# Patient Record
Sex: Female | Born: 1945 | Race: Black or African American | Hispanic: No | Smoking: Never smoker
Health system: Southern US, Community
[De-identification: ages and names within clinical notes are randomized; demographics above are authoritative.]

## PROBLEM LIST (undated history)

## (undated) DIAGNOSIS — E119 Type 2 diabetes mellitus without complications: Secondary | ICD-10-CM

## (undated) DIAGNOSIS — K219 Gastro-esophageal reflux disease without esophagitis: Secondary | ICD-10-CM

## (undated) DIAGNOSIS — I10 Essential (primary) hypertension: Secondary | ICD-10-CM

## (undated) DIAGNOSIS — C801 Malignant (primary) neoplasm, unspecified: Secondary | ICD-10-CM

## (undated) DIAGNOSIS — E785 Hyperlipidemia, unspecified: Secondary | ICD-10-CM

## (undated) HISTORY — PX: COLON SURGERY: SHX602

## (undated) HISTORY — PX: CHOLECYSTECTOMY: SHX55

## (undated) HISTORY — PX: OTHER SURGICAL HISTORY: SHX169

---

## 2014-03-03 ENCOUNTER — Ambulatory Visit: Payer: Self-pay | Admitting: Physician Assistant

## 2014-03-04 DIAGNOSIS — I10 Essential (primary) hypertension: Secondary | ICD-10-CM

## 2014-03-04 DIAGNOSIS — I872 Venous insufficiency (chronic) (peripheral): Secondary | ICD-10-CM | POA: Diagnosis present

## 2014-03-04 DIAGNOSIS — Z85038 Personal history of other malignant neoplasm of large intestine: Secondary | ICD-10-CM | POA: Diagnosis present

## 2014-03-04 DIAGNOSIS — I8311 Varicose veins of right lower extremity with inflammation: Secondary | ICD-10-CM | POA: Insufficient documentation

## 2014-04-13 DIAGNOSIS — G629 Polyneuropathy, unspecified: Secondary | ICD-10-CM

## 2014-04-13 DIAGNOSIS — M8580 Other specified disorders of bone density and structure, unspecified site: Secondary | ICD-10-CM | POA: Insufficient documentation

## 2014-04-13 DIAGNOSIS — E559 Vitamin D deficiency, unspecified: Secondary | ICD-10-CM | POA: Insufficient documentation

## 2014-04-13 DIAGNOSIS — G4730 Sleep apnea, unspecified: Secondary | ICD-10-CM | POA: Diagnosis present

## 2014-04-13 DIAGNOSIS — D509 Iron deficiency anemia, unspecified: Secondary | ICD-10-CM | POA: Insufficient documentation

## 2015-02-21 ENCOUNTER — Other Ambulatory Visit: Payer: Self-pay | Admitting: Geriatric Medicine

## 2015-02-21 DIAGNOSIS — Z1231 Encounter for screening mammogram for malignant neoplasm of breast: Secondary | ICD-10-CM

## 2015-02-21 DIAGNOSIS — Z0000 Encounter for general adult medical examination without abnormal findings: Secondary | ICD-10-CM

## 2015-03-16 ENCOUNTER — Ambulatory Visit: Payer: Self-pay | Attending: Geriatric Medicine

## 2015-05-11 ENCOUNTER — Encounter: Payer: Self-pay | Admitting: *Deleted

## 2015-05-12 ENCOUNTER — Ambulatory Visit
Admission: RE | Admit: 2015-05-12 | Discharge: 2015-05-12 | Disposition: A | Discharge status: Home / Self Care | Payer: Medicare (Managed Care) | Source: Ambulatory Visit | Attending: Gastroenterology | Admitting: Gastroenterology

## 2015-05-12 ENCOUNTER — Encounter
Admission: RE | Disposition: A | Discharge status: Home / Self Care | Payer: Self-pay | Source: Ambulatory Visit | Attending: Gastroenterology

## 2015-05-12 ENCOUNTER — Ambulatory Visit: Payer: Medicare (Managed Care) | Admitting: Anesthesiology

## 2015-05-12 ENCOUNTER — Encounter: Payer: Self-pay | Admitting: *Deleted

## 2015-05-12 DIAGNOSIS — R1310 Dysphagia, unspecified: Secondary | ICD-10-CM | POA: Diagnosis present

## 2015-05-12 DIAGNOSIS — E119 Type 2 diabetes mellitus without complications: Secondary | ICD-10-CM | POA: Insufficient documentation

## 2015-05-12 DIAGNOSIS — Z6841 Body Mass Index (BMI) 40.0 and over, adult: Secondary | ICD-10-CM | POA: Diagnosis not present

## 2015-05-12 DIAGNOSIS — Z85038 Personal history of other malignant neoplasm of large intestine: Secondary | ICD-10-CM | POA: Insufficient documentation

## 2015-05-12 DIAGNOSIS — E669 Obesity, unspecified: Secondary | ICD-10-CM | POA: Insufficient documentation

## 2015-05-12 DIAGNOSIS — E039 Hypothyroidism, unspecified: Secondary | ICD-10-CM | POA: Insufficient documentation

## 2015-05-12 DIAGNOSIS — Z7984 Long term (current) use of oral hypoglycemic drugs: Secondary | ICD-10-CM | POA: Insufficient documentation

## 2015-05-12 DIAGNOSIS — Z7951 Long term (current) use of inhaled steroids: Secondary | ICD-10-CM | POA: Insufficient documentation

## 2015-05-12 DIAGNOSIS — K210 Gastro-esophageal reflux disease with esophagitis: Secondary | ICD-10-CM | POA: Diagnosis not present

## 2015-05-12 DIAGNOSIS — Z7982 Long term (current) use of aspirin: Secondary | ICD-10-CM | POA: Diagnosis not present

## 2015-05-12 DIAGNOSIS — Z5309 Procedure and treatment not carried out because of other contraindication: Secondary | ICD-10-CM | POA: Insufficient documentation

## 2015-05-12 DIAGNOSIS — I10 Essential (primary) hypertension: Secondary | ICD-10-CM | POA: Diagnosis not present

## 2015-05-12 DIAGNOSIS — Z79899 Other long term (current) drug therapy: Secondary | ICD-10-CM | POA: Insufficient documentation

## 2015-05-12 HISTORY — DX: Type 2 diabetes mellitus without complications: E11.9

## 2015-05-12 HISTORY — PX: ESOPHAGOGASTRODUODENOSCOPY: SHX5428

## 2015-05-12 HISTORY — PX: COLONOSCOPY WITH PROPOFOL: SHX5780

## 2015-05-12 HISTORY — DX: Essential (primary) hypertension: I10

## 2015-05-12 HISTORY — DX: Malignant (primary) neoplasm, unspecified: C80.1

## 2015-05-12 LAB — GLUCOSE, CAPILLARY: Glucose-Capillary: 77 mg/dL (ref 65–99)

## 2015-05-12 SURGERY — COLONOSCOPY WITH PROPOFOL
Anesthesia: General

## 2015-05-12 MED ORDER — PROPOFOL 500 MG/50ML IV EMUL
INTRAVENOUS | Status: DC | PRN
Start: 2015-05-12 — End: 2015-05-12
  Administered 2015-05-12: 100 ug/kg/min via INTRAVENOUS

## 2015-05-12 MED ORDER — PROPOFOL 10 MG/ML IV BOLUS
INTRAVENOUS | Status: DC | PRN
  Administered 2015-05-12: 70 mg via INTRAVENOUS

## 2015-05-12 MED ORDER — SODIUM CHLORIDE 0.9 % IV SOLN
INTRAVENOUS | Status: DC
Start: 2015-05-12 — End: 2015-05-12
  Administered 2015-05-12: 12:00:00 via INTRAVENOUS

## 2015-05-12 MED ORDER — SODIUM CHLORIDE 0.9 % IV SOLN: INTRAVENOUS | Status: DC

## 2015-05-12 NOTE — H&P (Signed)
    Primary Care Physician:  Pcp Not In System Primary Gastroenterologist:  Dr. Candace Cruise  Pre-Procedure History & Physical: HPI:  Kaitlin Wagner is a 69 y.o. female is here for an EGD/colonoscopy.  Past Medical History  Diagnosis Date  . Cancer ([place]) colon  . Diabetes mellitus without complication ([place])   . Hypertension     Past Surgical History  Procedure Laterality Date  . Colon surgery    . Cholecystectomy      Prior to Admission medications   Medication Sig Start Date End Date Taking? Authorizing Provider  ammonium lactate (AMLACTIN) 12 % cream Apply topically 2 (two) times daily.   Yes Historical Provider, MD  aspirin EC 81 MG tablet Take 81 mg by mouth daily.   Yes Historical Provider, MD  atorvastatin (LIPITOR) 40 MG tablet Take 40 mg by mouth daily.   Yes Historical Provider, MD  cholecalciferol (VITAMIN D) 400 UNITS TABS tablet Take 1,000 Units by mouth.   Yes Historical Provider, MD  fluticasone (FLONASE) 50 MCG/ACT nasal spray Place 1 spray into both nostrils daily.   Yes Historical Provider, MD  lisinopril-hydrochlorothiazide (PRINZIDE,ZESTORETIC) 20-12.5 MG tablet Take 2 tablets by mouth daily.   Yes Historical Provider, MD  metoprolol succinate (TOPROL-XL) 25 MG 24 hr tablet Take 25 mg by mouth daily.   Yes Historical Provider, MD  pioglitazone (ACTOS) 15 MG tablet Take 15 mg by mouth daily.   Yes Historical Provider, MD    Allergies as of 04/14/2015  . (Not on File)    History reviewed. No pertinent family history.  Social History   Social History  . Marital Status: Divorced    Spouse Name: N/A  . Number of Children: N/A  . Years of Education: N/A   Occupational History  . Not on file.   Social History Main Topics  . Smoking status: Never Smoker   . Smokeless tobacco: Not on file  . Alcohol Use: No  . Drug Use: No  . Sexual Activity: Not on file   Other Topics Concern  . Not on file   Social History Narrative  . No narrative on file    Review  of Systems: See HPI, otherwise negative ROS  Physical Exam: There were no vitals taken for this visit. General:   Alert,  pleasant and cooperative in NAD Head:  Normocephalic and atraumatic. Neck:  Supple; no masses or thyromegaly. Lungs:  Clear throughout to auscultation.    Heart:  Regular rate and rhythm. Abdomen:  Soft, nontender and nondistended. Normal bowel sounds, without guarding, and without rebound.   Neurologic:  Alert and  oriented x4;  grossly normal neurologically.  Impression/Plan: Kaitlin Wagner is here for an colonoscopy/EGD to be performed for hx of colon cancer and dysphagia.  Risks, benefits, limitations, and alternatives regarding EGD/colonoscopy have been reviewed with the patient.  Questions have been answered.  All parties agreeable.   Urijah Arko, Lupita Dawn, MD  05/12/2015, 10:54 AM

## 2015-05-12 NOTE — Op Note (Signed)
[facility] Gastroenterology Patient Name: Kaitlin Wagner Procedure Date: 05/12/2015 11:34 AM MRN: [id number] Account #: [id number] Date of Birth: January 08, 1946 Admit Type: Outpatient Age: 69 Room: [facility] ENDO ROOM 4 Gender: Female Note Status: Finalized Procedure:         Upper GI endoscopy Indications:       Dysphagia Providers:         Lupita Dawn. Candace Cruise, MD Referring MD:      Forest Gleason Md, MD (Referring MD) Medicines:         Monitored Anesthesia Care Complications:     No immediate complications. Procedure:         Pre-Anesthesia Assessment:                    - Prior to the procedure, a History and Physical was                     performed, and patient medications, allergies and                     sensitivities were reviewed. The patient's tolerance of                     previous anesthesia was reviewed.                    - The risks and benefits of the procedure and the sedation                     options and risks were discussed with the patient. All                     questions were answered and informed consent was obtained.                    - After reviewing the risks and benefits, the patient was                     deemed in satisfactory condition to undergo the procedure.                    After obtaining informed consent, the endoscope was passed                     under direct vision. Throughout the procedure, the                     patient's blood pressure, pulse, and oxygen saturations                     were monitored continuously. The Endoscope was introduced                     through the mouth, and advanced to the second part of                     duodenum. The upper GI endoscopy was accomplished without                     difficulty. The patient tolerated the procedure well. Findings:      There were esophageal mucosal changes suspicious for short-segment       Barrett's esophagus present at the gastroesophageal junction. The       maximum  longitudinal extent of these mucosal changes  was 2 cm in length.       Mucosa was biopsied with a cold forceps for histology in a targeted       manner at the gastroesophageal junction. One specimen bottle was sent to       pathology. The scope was withdrawn. Dilation was performed with a       Maloney dilator with mild resistance at 56 Fr.      The exam was otherwise without abnormality.      The entire examined stomach was normal.      The examined duodenum was normal. Impression:        - Esophageal mucosal changes suspicious for short-segment                     Barrett's esophagus. Biopsied. Dilated.                    - The examination was otherwise normal.                    - Normal stomach.                    - Normal examined duodenum. Recommendation:    - Discharge patient to home.                    - Observe patient's clinical course.                    - Await pathology results.                    - Continue present medications.                    - The findings and recommendations were discussed with the                     patient. Procedure Code(s): --- Professional ---                    [phone], Esophagogastroduodenoscopy, flexible, transoral;                     with biopsy, single or multiple                    43450, Dilation of esophagus, by unguided sound or bougie,                     single or multiple passes Diagnosis Code(s): --- Professional ---                    K22.8, Other specified diseases of esophagus                    R13.10, Dysphagia, unspecified CPT copyright 2014 American Medical Association. All rights reserved. The codes documented in this report are preliminary and upon coder review may  be revised to meet current compliance requirements. Hulen Luster, MD 05/12/2015 12:10:24 PM This report has been signed electronically. Number of Addenda: 0 Note Initiated On: 05/12/2015 11:34 AM      [facility]

## 2015-05-12 NOTE — Transfer of Care (Signed)
Immediate Anesthesia Transfer of Care Note  Patient: Kaitlin Wagner  Procedure(s) Performed: Procedure(s): COLONOSCOPY WITH PROPOFOL (N/A) ESOPHAGOGASTRODUODENOSCOPY (EGD) (N/A)  Patient Location: PACU  Anesthesia Type:General  Level of Consciousness: awake and alert   Airway & Oxygen Therapy: Patient Spontanous Breathing and Patient connected to nasal cannula oxygen  Post-op Assessment: Report given to RN and Post -op Vital signs reviewed and stable  Post vital signs: Reviewed and stable  Last Vitals:  Filed Vitals:   05/12/15 1232  BP: 134/64  Pulse: 85  Temp:   Resp: 24    Complications: No apparent anesthesia complications

## 2015-05-12 NOTE — Anesthesia Postprocedure Evaluation (Signed)
  Anesthesia Post-op Note  Patient: Kaitlin Wagner  Procedure(s) Performed: Procedure(s): COLONOSCOPY WITH PROPOFOL (N/A) ESOPHAGOGASTRODUODENOSCOPY (EGD) (N/A)  Anesthesia type:General  Patient location: PACU  Post pain: Pain level controlled  Post assessment: Post-op Vital signs reviewed, Patient's Cardiovascular Status Stable, Respiratory Function Stable, Patent Airway and No signs of Nausea or vomiting  Post vital signs: Reviewed and stable  Last Vitals:  Filed Vitals:   05/12/15 1252  BP: 138/78  Pulse: 78  Temp:   Resp: 21    Level of consciousness: awake, alert  and patient cooperative  Complications: No apparent anesthesia complications

## 2015-05-12 NOTE — Op Note (Signed)
Elected not to proceed with colonoscopy when large thick liquid stool came out before I started. Rectal exam confirmed poor prep. Will need to reschedule colonoscopy with much better prep next time. EGD was done with bx and dilation.

## 2015-05-12 NOTE — Anesthesia Preprocedure Evaluation (Addendum)
Anesthesia Evaluation  Patient identified by MRN, date of birth, ID band  Reviewed: Allergy & Precautions, NPO status , Patient's Chart, lab work & pertinent test results  History of Anesthesia Complications Negative for: history of anesthetic complications  Airway Mallampati: III       Dental  (+) Partial Upper, Partial Lower   Pulmonary neg pulmonary ROS,    Pulmonary exam normal  + decreased breath sounds      Cardiovascular hypertension, Pt. on medications and Pt. on home beta blockers  Rhythm:Regular Rate:Normal     Neuro/Psych    GI/Hepatic negative GI ROS,   Endo/Other  diabetes, Type 2, Oral Hypoglycemic AgentsHypothyroidism   Renal/GU negative Renal ROS     Musculoskeletal negative musculoskeletal ROS (+)   Abdominal (+) + obese,   Peds  Hematology negative hematology ROS (+)   Anesthesia Other Findings   Reproductive/Obstetrics                            Anesthesia Physical Anesthesia Plan  ASA: III  Anesthesia Plan: General   Post-op Pain Management:    Induction: Intravenous  Airway Management Planned: Nasal Cannula  Additional Equipment:   Intra-op Plan:   Post-operative Plan:   Informed Consent: I have reviewed the patients History and Physical, chart, labs and discussed the procedure including the risks, benefits and alternatives for the proposed anesthesia with the patient or authorized representative who has indicated his/her understanding and acceptance.     Plan Discussed with: CRNA  Anesthesia Plan Comments:        Anesthesia Quick Evaluation

## 2015-05-15 LAB — SURGICAL PATHOLOGY

## 2015-05-17 ENCOUNTER — Encounter: Payer: Self-pay | Admitting: Gastroenterology

## 2015-07-04 ENCOUNTER — Ambulatory Visit
Admission: RE | Admit: 2015-07-04 | Discharge: 2015-07-04 | Disposition: A | Discharge status: Home / Self Care | Payer: Medicare (Managed Care) | Source: Ambulatory Visit | Attending: Geriatric Medicine | Admitting: Geriatric Medicine

## 2015-07-04 DIAGNOSIS — Z1231 Encounter for screening mammogram for malignant neoplasm of breast: Secondary | ICD-10-CM | POA: Diagnosis not present

## 2015-07-04 DIAGNOSIS — Z13820 Encounter for screening for osteoporosis: Secondary | ICD-10-CM | POA: Diagnosis present

## 2015-07-04 DIAGNOSIS — Z780 Asymptomatic menopausal state: Secondary | ICD-10-CM | POA: Insufficient documentation

## 2015-07-04 DIAGNOSIS — Z0000 Encounter for general adult medical examination without abnormal findings: Secondary | ICD-10-CM

## 2018-05-12 ENCOUNTER — Encounter: Payer: Self-pay | Admitting: Anesthesiology

## 2018-05-12 ENCOUNTER — Ambulatory Visit: Payer: Medicare (Managed Care) | Admitting: Anesthesiology

## 2018-05-12 ENCOUNTER — Encounter
Admission: RE | Disposition: A | Discharge status: Home / Self Care | Payer: Self-pay | Source: Ambulatory Visit | Attending: Internal Medicine

## 2018-05-12 ENCOUNTER — Other Ambulatory Visit: Payer: Self-pay

## 2018-05-12 ENCOUNTER — Ambulatory Visit
Admission: RE | Admit: 2018-05-12 | Discharge: 2018-05-12 | Disposition: A | Discharge status: Home / Self Care | Payer: Medicare (Managed Care) | Source: Ambulatory Visit | Attending: Internal Medicine | Admitting: Internal Medicine

## 2018-05-12 DIAGNOSIS — Z1211 Encounter for screening for malignant neoplasm of colon: Secondary | ICD-10-CM | POA: Diagnosis not present

## 2018-05-12 DIAGNOSIS — E119 Type 2 diabetes mellitus without complications: Secondary | ICD-10-CM | POA: Insufficient documentation

## 2018-05-12 DIAGNOSIS — I10 Essential (primary) hypertension: Secondary | ICD-10-CM | POA: Diagnosis not present

## 2018-05-12 DIAGNOSIS — E785 Hyperlipidemia, unspecified: Secondary | ICD-10-CM | POA: Insufficient documentation

## 2018-05-12 DIAGNOSIS — Z9221 Personal history of antineoplastic chemotherapy: Secondary | ICD-10-CM | POA: Insufficient documentation

## 2018-05-12 DIAGNOSIS — Z980 Intestinal bypass and anastomosis status: Secondary | ICD-10-CM | POA: Diagnosis not present

## 2018-05-12 DIAGNOSIS — Z7982 Long term (current) use of aspirin: Secondary | ICD-10-CM | POA: Insufficient documentation

## 2018-05-12 DIAGNOSIS — K640 First degree hemorrhoids: Secondary | ICD-10-CM | POA: Insufficient documentation

## 2018-05-12 DIAGNOSIS — Z7951 Long term (current) use of inhaled steroids: Secondary | ICD-10-CM | POA: Insufficient documentation

## 2018-05-12 DIAGNOSIS — Z79899 Other long term (current) drug therapy: Secondary | ICD-10-CM | POA: Insufficient documentation

## 2018-05-12 DIAGNOSIS — Z800 Family history of malignant neoplasm of digestive organs: Secondary | ICD-10-CM | POA: Insufficient documentation

## 2018-05-12 DIAGNOSIS — Z85038 Personal history of other malignant neoplasm of large intestine: Secondary | ICD-10-CM | POA: Diagnosis not present

## 2018-05-12 DIAGNOSIS — Z7984 Long term (current) use of oral hypoglycemic drugs: Secondary | ICD-10-CM | POA: Diagnosis not present

## 2018-05-12 HISTORY — DX: Hyperlipidemia, unspecified: E78.5

## 2018-05-12 HISTORY — DX: Gastro-esophageal reflux disease without esophagitis: K21.9

## 2018-05-12 HISTORY — PX: COLONOSCOPY WITH PROPOFOL: SHX5780

## 2018-05-12 LAB — GLUCOSE, CAPILLARY: Glucose-Capillary: 65 mg/dL — ABNORMAL LOW (ref 70–99)

## 2018-05-12 SURGERY — COLONOSCOPY WITH PROPOFOL
Anesthesia: General

## 2018-05-12 MED ORDER — FENTANYL CITRATE (PF) 100 MCG/2ML IJ SOLN: 25.0000 ug | INTRAMUSCULAR | Status: DC | PRN

## 2018-05-12 MED ORDER — PROPOFOL 500 MG/50ML IV EMUL
INTRAVENOUS | Status: AC
  Filled 2018-05-12: qty 50

## 2018-05-12 MED ORDER — LIDOCAINE HCL (PF) 1 % IJ SOLN
INTRAMUSCULAR | Status: AC
  Filled 2018-05-12: qty 2

## 2018-05-12 MED ORDER — ONDANSETRON HCL 4 MG/2ML IJ SOLN: 4.0000 mg | Freq: Once | INTRAMUSCULAR | Status: DC | PRN

## 2018-05-12 MED ORDER — DEXTROSE 50 % IV SOLN
20.0000 mL | Freq: Once | INTRAVENOUS | Status: AC
  Administered 2018-05-12: 20 mL via INTRAVENOUS

## 2018-05-12 MED ORDER — SODIUM CHLORIDE 0.9 % IV SOLN: INTRAVENOUS | Status: DC

## 2018-05-12 MED ORDER — DEXTROSE 50 % IV SOLN
INTRAVENOUS | Status: AC
  Filled 2018-05-12: qty 50

## 2018-05-12 MED ORDER — SODIUM CHLORIDE 0.9 % IV SOLN
INTRAVENOUS | Status: DC | PRN
  Administered 2018-05-12: 13:00:00 via INTRAVENOUS

## 2018-05-12 NOTE — H&P (Signed)
Patient presents for colonoscopy for heme positive stool, rectal bleeding a personal history of colon cancer in 2013 status post surgery and chemotherapy.. The patient denies complaints of abdominal pain, significant change in bowel habits, or rectal bleeding.

## 2018-05-12 NOTE — Anesthesia Post-op Follow-up Note (Signed)
Anesthesia QCDR form completed.        

## 2018-05-12 NOTE — Op Note (Addendum)
[facility] Gastroenterology Patient Name: Kaitlin Wagner Procedure Date: 05/12/2018 11:11 AM MRN: [id number] Account #: [id number] Date of Birth: 07/19/1945 Admit Type: Outpatient Age: 72 Room: [facility] ENDO ROOM 1 Gender: Female Note Status: Finalized Procedure:            Colonoscopy Indications:          High risk colon cancer surveillance: Personal history                        of colon cancer Providers:            Benay Pike. Alice Reichert MD, MD Referring MD:         No Local Md, MD (Referring MD) Medicines:            Propofol per Anesthesia Complications:        No immediate complications. Procedure:            Pre-Anesthesia Assessment:                       - The risks and benefits of the procedure and the                        sedation options and risks were discussed with the                        patient. All questions were answered and informed                        consent was obtained.                       - Patient identification and proposed procedure were                        verified prior to the procedure by the nurse. The                        procedure was verified in the procedure room.                       - ASA Grade Assessment: III - A patient with severe                        systemic disease.                       - After reviewing the risks and benefits, the patient                        was deemed in satisfactory condition to undergo the                        procedure.                       After obtaining informed consent, the colonoscope was                        passed under direct vision. Throughout the procedure,  the patient's blood pressure, pulse, and oxygen                        saturations were monitored continuously. The                        Colonoscope was introduced through the anus and                        advanced to the the ileocolonic anastomosis. The                        colonoscopy  was performed without difficulty. The                        patient tolerated the procedure well. The quality of                        the bowel preparation was excellent. Findings:      The perianal and digital rectal examinations were normal. Pertinent       negatives include normal sphincter tone and no palpable rectal lesions.      There was evidence of a prior functional end-to-end ileo-rectal       anastomosis in the rectum. This was patent and was characterized by       healthy appearing mucosa. The anastomosis was traversed. Estimated blood       loss: none. This was biopsied with a cold forceps for histology.      Non-bleeding internal hemorrhoids were found during retroflexion. The       hemorrhoids were Grade I (internal hemorrhoids that do not prolapse).      The exam was otherwise without abnormality. Impression:           - Patent functional end-to-end ileo-rectal anastomosis,                        characterized by healthy appearing mucosa.                       - Non-bleeding internal hemorrhoids.                       - The examination was otherwise normal.                       - No specimens collected. Recommendation:       - Patient has a contact number available for                        emergencies. The signs and symptoms of potential                        delayed complications were discussed with the patient.                        Return to normal activities tomorrow. Written discharge                        instructions were provided to the patient.                       -  Resume previous diet.                       - Continue present medications.                       - Repeat colonoscopy in 3 years for surveillance.                       - Return to GI office PRN.                       - The findings and recommendations were discussed with                        the patient and their family. Procedure Code(s):    --- Professional ---                        [phone], Colonoscopy, flexible; with biopsy, single or                        multiple Diagnosis Code(s):    --- Professional ---                       K64.0, First degree hemorrhoids                       Z98.0, Intestinal bypass and anastomosis status                       Z85.038, Personal history of other malignant neoplasm                        of large intestine CPT copyright 2018 American Medical Association. All rights reserved. The codes documented in this report are preliminary and upon coder review may  be revised to meet current compliance requirements. Efrain Sella MD, MD 05/12/2018 1:03:48 PM This report has been signed electronically. Number of Addenda: 0 Note Initiated On: 05/12/2018 11:11 AM Total Procedure Duration: 0 hours 7 minutes 52 seconds       [facility]

## 2018-05-12 NOTE — Interval H&P Note (Signed)
History and Physical Interval Note:  05/12/2018 10:38 AM  Kaitlin Wagner  has presented today for surgery, with the diagnosis of HEME POSITIVE RECTAL BLEED PRS HX C CA  The various methods of treatment have been discussed with the patient and family. After consideration of risks, benefits and other options for treatment, the patient has consented to  Procedure(s): COLONOSCOPY WITH PROPOFOL (N/A) as a surgical intervention .  The patient's history has been reviewed, patient examined, no change in status, stable for surgery.  I have reviewed the patient's chart and labs.  Questions were answered to the patient's satisfaction.     [place], [place]

## 2018-05-12 NOTE — Anesthesia Preprocedure Evaluation (Signed)
Anesthesia Evaluation  Patient identified by MRN, date of birth, ID band  Reviewed: Allergy & Precautions, NPO status , Patient's Chart, lab work & pertinent test results  History of Anesthesia Complications Negative for: history of anesthetic complications  Airway Mallampati: III       Dental  (+) Partial Upper, Partial Lower   Pulmonary neg pulmonary ROS,    Pulmonary exam normal  + decreased breath sounds      Cardiovascular hypertension, Pt. on medications and Pt. on home beta blockers  Rhythm:Regular Rate:Normal     Neuro/Psych    GI/Hepatic GERD  ,  Endo/Other  diabetes, Type 2, Oral Hypoglycemic AgentsHypothyroidism   Renal/GU negative Renal ROS     Musculoskeletal negative musculoskeletal ROS (+)   Abdominal (+) + obese,   Peds  Hematology negative hematology ROS (+)   Anesthesia Other Findings   Reproductive/Obstetrics                             Anesthesia Physical  Anesthesia Plan  ASA: III  Anesthesia Plan: General   Post-op Pain Management:    Induction: Intravenous  PONV Risk Score and Plan:   Airway Management Planned: Nasal Cannula  Additional Equipment:   Intra-op Plan:   Post-operative Plan:   Informed Consent: I have reviewed the patients History and Physical, chart, labs and discussed the procedure including the risks, benefits and alternatives for the proposed anesthesia with the patient or authorized representative who has indicated his/her understanding and acceptance.     Plan Discussed with: CRNA  Anesthesia Plan Comments:         Anesthesia Quick Evaluation

## 2018-05-12 NOTE — Transfer of Care (Signed)
Immediate Anesthesia Transfer of Care Note  Patient: Kaitlin Wagner  Procedure(s) Performed: COLONOSCOPY WITH PROPOFOL (N/A )  Patient Location: PACU  Anesthesia Type:General  Level of Consciousness: awake, alert  and oriented  Airway & Oxygen Therapy: Patient Spontanous Breathing  Post-op Assessment: Report given to RN  Post vital signs: Reviewed and stable  Last Vitals:  Vitals Value Taken Time  BP    Temp    Pulse    Resp    SpO2      Last Pain:  Vitals:   05/12/18 1045  TempSrc: Tympanic  PainSc: 0-No pain         Complications: No apparent anesthesia complications

## 2018-05-12 NOTE — Anesthesia Postprocedure Evaluation (Signed)
Anesthesia Post Note  Patient: Kaitlin Wagner  Procedure(s) Performed: COLONOSCOPY WITH PROPOFOL (N/A )  Patient location during evaluation: PACU Anesthesia Type: General Level of consciousness: awake and alert and oriented Pain management: pain level controlled Vital Signs Assessment: post-procedure vital signs reviewed and stable Respiratory status: spontaneous breathing Cardiovascular status: blood pressure returned to baseline Anesthetic complications: no     Last Vitals:  Vitals:   05/12/18 1045 05/12/18 1301  BP: 100/71 108/64  Pulse:  87  Resp: 20 17  Temp: (!) 36.3 C (!) 36.2 C  SpO2: 98% 97%    Last Pain:  Vitals:   05/12/18 1301  TempSrc: Tympanic  PainSc: Asleep                 Nautika Cressey

## 2018-05-13 ENCOUNTER — Encounter: Payer: Self-pay | Admitting: Internal Medicine

## 2018-05-14 LAB — SURGICAL PATHOLOGY

## 2018-07-08 ENCOUNTER — Ambulatory Visit
Admission: RE | Admit: 2018-07-08 | Payer: Medicare (Managed Care) | Source: Home / Self Care | Admitting: Internal Medicine

## 2018-07-08 ENCOUNTER — Encounter: Admission: RE | Payer: Self-pay | Source: Home / Self Care

## 2018-07-08 SURGERY — COLONOSCOPY WITH PROPOFOL
Anesthesia: General

## 2020-10-20 ENCOUNTER — Observation Stay: Payer: Medicare (Managed Care)

## 2020-10-20 ENCOUNTER — Other Ambulatory Visit: Payer: Self-pay

## 2020-10-20 ENCOUNTER — Inpatient Hospital Stay
Admission: EM | Admit: 2020-10-20 | Discharge: 2020-10-27 | DRG: 871 | Disposition: A | Discharge status: Skilled Nursing Facility | Payer: Medicare (Managed Care) | Attending: Internal Medicine | Admitting: Internal Medicine

## 2020-10-20 ENCOUNTER — Emergency Department: Payer: Medicare (Managed Care)

## 2020-10-20 DIAGNOSIS — E860 Dehydration: Secondary | ICD-10-CM | POA: Diagnosis not present

## 2020-10-20 DIAGNOSIS — A419 Sepsis, unspecified organism: Secondary | ICD-10-CM | POA: Diagnosis not present

## 2020-10-20 DIAGNOSIS — R935 Abnormal findings on diagnostic imaging of other abdominal regions, including retroperitoneum: Secondary | ICD-10-CM

## 2020-10-20 DIAGNOSIS — Z20822 Contact with and (suspected) exposure to covid-19: Secondary | ICD-10-CM | POA: Diagnosis present

## 2020-10-20 DIAGNOSIS — R140 Abdominal distension (gaseous): Secondary | ICD-10-CM

## 2020-10-20 DIAGNOSIS — K439 Ventral hernia without obstruction or gangrene: Secondary | ICD-10-CM | POA: Diagnosis present

## 2020-10-20 DIAGNOSIS — Z66 Do not resuscitate: Secondary | ICD-10-CM | POA: Diagnosis present

## 2020-10-20 DIAGNOSIS — Z7982 Long term (current) use of aspirin: Secondary | ICD-10-CM

## 2020-10-20 DIAGNOSIS — K668 Other specified disorders of peritoneum: Secondary | ICD-10-CM

## 2020-10-20 DIAGNOSIS — F0390 Unspecified dementia without behavioral disturbance: Secondary | ICD-10-CM | POA: Diagnosis not present

## 2020-10-20 DIAGNOSIS — N179 Acute kidney failure, unspecified: Secondary | ICD-10-CM | POA: Diagnosis not present

## 2020-10-20 DIAGNOSIS — R4182 Altered mental status, unspecified: Secondary | ICD-10-CM | POA: Diagnosis present

## 2020-10-20 DIAGNOSIS — K22719 Barrett's esophagus with dysplasia, unspecified: Secondary | ICD-10-CM

## 2020-10-20 DIAGNOSIS — L8990 Pressure ulcer of unspecified site, unspecified stage: Secondary | ICD-10-CM | POA: Insufficient documentation

## 2020-10-20 DIAGNOSIS — K219 Gastro-esophageal reflux disease without esophagitis: Secondary | ICD-10-CM

## 2020-10-20 DIAGNOSIS — R6510 Systemic inflammatory response syndrome (SIRS) of non-infectious origin without acute organ dysfunction: Secondary | ICD-10-CM

## 2020-10-20 DIAGNOSIS — E119 Type 2 diabetes mellitus without complications: Secondary | ICD-10-CM | POA: Diagnosis present

## 2020-10-20 DIAGNOSIS — Z6830 Body mass index (BMI) 30.0-30.9, adult: Secondary | ICD-10-CM

## 2020-10-20 DIAGNOSIS — L89153 Pressure ulcer of sacral region, stage 3: Secondary | ICD-10-CM | POA: Diagnosis present

## 2020-10-20 DIAGNOSIS — F419 Anxiety disorder, unspecified: Secondary | ICD-10-CM | POA: Diagnosis present

## 2020-10-20 DIAGNOSIS — K2270 Barrett's esophagus without dysplasia: Secondary | ICD-10-CM

## 2020-10-20 DIAGNOSIS — R54 Age-related physical debility: Secondary | ICD-10-CM | POA: Diagnosis present

## 2020-10-20 DIAGNOSIS — F03C0 Unspecified dementia, severe, without behavioral disturbance, psychotic disturbance, mood disturbance, and anxiety: Secondary | ICD-10-CM

## 2020-10-20 DIAGNOSIS — I10 Essential (primary) hypertension: Secondary | ICD-10-CM

## 2020-10-20 DIAGNOSIS — Z79899 Other long term (current) drug therapy: Secondary | ICD-10-CM

## 2020-10-20 DIAGNOSIS — Z960 Presence of urogenital implants: Secondary | ICD-10-CM | POA: Diagnosis present

## 2020-10-20 DIAGNOSIS — F32A Depression, unspecified: Secondary | ICD-10-CM

## 2020-10-20 DIAGNOSIS — E876 Hypokalemia: Secondary | ICD-10-CM

## 2020-10-20 DIAGNOSIS — R799 Abnormal finding of blood chemistry, unspecified: Secondary | ICD-10-CM

## 2020-10-20 DIAGNOSIS — R410 Disorientation, unspecified: Secondary | ICD-10-CM | POA: Diagnosis not present

## 2020-10-20 DIAGNOSIS — G309 Alzheimer's disease, unspecified: Secondary | ICD-10-CM | POA: Diagnosis present

## 2020-10-20 DIAGNOSIS — K6389 Other specified diseases of intestine: Secondary | ICD-10-CM | POA: Diagnosis present

## 2020-10-20 DIAGNOSIS — E785 Hyperlipidemia, unspecified: Secondary | ICD-10-CM | POA: Diagnosis present

## 2020-10-20 DIAGNOSIS — R159 Full incontinence of feces: Secondary | ICD-10-CM | POA: Diagnosis not present

## 2020-10-20 DIAGNOSIS — E46 Unspecified protein-calorie malnutrition: Secondary | ICD-10-CM | POA: Diagnosis present

## 2020-10-20 DIAGNOSIS — Z9049 Acquired absence of other specified parts of digestive tract: Secondary | ICD-10-CM

## 2020-10-20 DIAGNOSIS — B370 Candidal stomatitis: Secondary | ICD-10-CM

## 2020-10-20 DIAGNOSIS — F0280 Dementia in other diseases classified elsewhere without behavioral disturbance: Secondary | ICD-10-CM | POA: Diagnosis present

## 2020-10-20 DIAGNOSIS — Z85038 Personal history of other malignant neoplasm of large intestine: Secondary | ICD-10-CM

## 2020-10-20 DIAGNOSIS — R6520 Severe sepsis without septic shock: Secondary | ICD-10-CM | POA: Diagnosis present

## 2020-10-20 DIAGNOSIS — S31000A Unspecified open wound of lower back and pelvis without penetration into retroperitoneum, initial encounter: Secondary | ICD-10-CM | POA: Diagnosis present

## 2020-10-20 DIAGNOSIS — N390 Urinary tract infection, site not specified: Secondary | ICD-10-CM | POA: Diagnosis present

## 2020-10-20 LAB — URINALYSIS, COMPLETE (UACMP) WITH MICROSCOPIC
Bilirubin Urine: NEGATIVE
Glucose, UA: NEGATIVE mg/dL
Ketones, ur: NEGATIVE mg/dL
Nitrite: NEGATIVE
Protein, ur: 30 mg/dL — AB
RBC / HPF: >50 RBC/hpf — ABNORMAL HIGH (ref 0–5)
Specific Gravity, Urine: 1.014 (ref 1.005–1.030)
WBC, UA: >50 WBC/hpf — ABNORMAL HIGH (ref 0–5)
pH: 5 (ref 5.0–8.0)

## 2020-10-20 LAB — COMPREHENSIVE METABOLIC PANEL
ALT: 23 U/L (ref 0–44)
AST: 16 U/L (ref 15–41)
Albumin: 4.4 g/dL (ref 3.5–5.0)
Alkaline Phosphatase: 118 U/L (ref 38–126)
Anion gap: 12 (ref 5–15)
BUN: 122 mg/dL — ABNORMAL HIGH (ref 8–23)
CO2: 18 mmol/L — ABNORMAL LOW (ref 22–32)
Calcium: 10.3 mg/dL (ref 8.9–10.3)
Chloride: 107 mmol/L (ref 98–111)
Creatinine, Ser: 3.49 mg/dL — ABNORMAL HIGH (ref 0.44–1.00)
GFR, Estimated: 13 mL/min — ABNORMAL LOW (ref >60)
Glucose, Bld: 107 mg/dL — ABNORMAL HIGH (ref 70–99)
Potassium: 4.1 mmol/L (ref 3.5–5.1)
Sodium: 137 mmol/L (ref 135–145)
Total Bilirubin: 1 mg/dL (ref 0.3–1.2)
Total Protein: 9.2 g/dL — ABNORMAL HIGH (ref 6.5–8.1)

## 2020-10-20 LAB — CBC
HCT: 41.2 % (ref 36.0–46.0)
Hemoglobin: 14 g/dL (ref 12.0–15.0)
MCH: 28.2 pg (ref 26.0–34.0)
MCHC: 34 g/dL (ref 30.0–36.0)
MCV: 83.1 fL (ref 80.0–100.0)
Platelets: 392 10*3/uL (ref 150–400)
RBC: 4.96 MIL/uL (ref 3.87–5.11)
RDW: 17 % — ABNORMAL HIGH (ref 11.5–15.5)
WBC: 13.4 10*3/uL — ABNORMAL HIGH (ref 4.0–10.5)
nRBC: 0 % (ref 0.0–0.2)

## 2020-10-20 LAB — LACTIC ACID, PLASMA
Lactic Acid, Venous: 1.9 mmol/L (ref 0.5–1.9)
Lactic Acid, Venous: 1.9 mmol/L (ref 0.5–1.9)

## 2020-10-20 LAB — PROCALCITONIN: Procalcitonin: <0.1 ng/mL

## 2020-10-20 LAB — RESP PANEL BY RT-PCR (FLU A&B, COVID) ARPGX2
Influenza A by PCR: NEGATIVE
Influenza B by PCR: NEGATIVE
SARS Coronavirus 2 by RT PCR: NEGATIVE

## 2020-10-20 LAB — TSH: TSH: 0.463 u[IU]/mL (ref 0.350–4.500)

## 2020-10-20 LAB — CBG MONITORING, ED: Glucose-Capillary: 117 mg/dL — ABNORMAL HIGH (ref 70–99)

## 2020-10-20 MED ORDER — SODIUM CHLORIDE 0.9 % IV BOLUS
1000.0000 mL | Freq: Once | INTRAVENOUS | Status: AC
  Administered 2020-10-20: 1000 mL via INTRAVENOUS

## 2020-10-20 MED ORDER — ATORVASTATIN CALCIUM 20 MG PO TABS: 40.0000 mg | ORAL_TABLET | Freq: Every day | ORAL | Status: DC

## 2020-10-20 MED ORDER — ONDANSETRON HCL 4 MG PO TABS: 4.0000 mg | ORAL_TABLET | Freq: Four times a day (QID) | ORAL | Status: DC | PRN

## 2020-10-20 MED ORDER — SODIUM CHLORIDE 0.9 % IV SOLN
INTRAVENOUS | Status: DC | PRN
  Administered 2020-10-20: 18:00:00 250 mL via INTRAVENOUS

## 2020-10-20 MED ORDER — AMLODIPINE-ATORVASTATIN 10-40 MG PO TABS: 1.0000 | ORAL_TABLET | Freq: Every day | ORAL | Status: DC

## 2020-10-20 MED ORDER — HEPARIN SODIUM (PORCINE) 5000 UNIT/ML IJ SOLN
5000.0000 [IU] | Freq: Three times a day (TID) | INTRAMUSCULAR | Status: DC
  Administered 2020-10-20 – 2020-10-27 (×22): 5000 [IU] via SUBCUTANEOUS
  Filled 2020-10-20 (×22): qty 1

## 2020-10-20 MED ORDER — ONDANSETRON HCL 4 MG/2ML IJ SOLN: 4.0000 mg | Freq: Four times a day (QID) | INTRAMUSCULAR | Status: DC | PRN

## 2020-10-20 MED ORDER — ASPIRIN EC 81 MG PO TBEC: 81.0000 mg | DELAYED_RELEASE_TABLET | Freq: Every day | ORAL | Status: DC

## 2020-10-20 MED ORDER — PIPERACILLIN-TAZOBACTAM IN DEX 2-0.25 GM/50ML IV SOLN
2.2500 g | Freq: Three times a day (TID) | INTRAVENOUS | Status: DC
  Administered 2020-10-21 – 2020-10-24 (×10): 2.25 g via INTRAVENOUS
  Filled 2020-10-20 (×13): qty 50

## 2020-10-20 MED ORDER — VENLAFAXINE HCL 25 MG PO TABS
25.0000 mg | ORAL_TABLET | Freq: Two times a day (BID) | ORAL | Status: DC
  Filled 2020-10-20: qty 1

## 2020-10-20 MED ORDER — AMLODIPINE BESYLATE 10 MG PO TABS: 10.0000 mg | ORAL_TABLET | Freq: Every day | ORAL | Status: DC

## 2020-10-20 MED ORDER — PIPERACILLIN-TAZOBACTAM 3.375 G IVPB 30 MIN
3.3750 g | Freq: Three times a day (TID) | INTRAVENOUS | Status: DC
  Administered 2020-10-20: 3.375 g via INTRAVENOUS
  Filled 2020-10-20 (×3): qty 50

## 2020-10-20 MED ORDER — ACETAMINOPHEN 325 MG PO TABS: 650.0000 mg | ORAL_TABLET | Freq: Four times a day (QID) | ORAL | Status: AC | PRN

## 2020-10-20 MED ORDER — LACTATED RINGERS IV SOLN
INTRAVENOUS | Status: AC
  Administered 2020-10-25: 100 mL/h via INTRAVENOUS

## 2020-10-20 MED ORDER — ACETAMINOPHEN 650 MG RE SUPP: 650.0000 mg | Freq: Four times a day (QID) | RECTAL | Status: AC | PRN

## 2020-10-20 MED ORDER — CHLORHEXIDINE GLUCONATE CLOTH 2 % EX PADS
6.0000 | MEDICATED_PAD | Freq: Every day | CUTANEOUS | Status: DC
  Administered 2020-10-21 – 2020-10-27 (×7): 6 via TOPICAL

## 2020-10-20 MED ORDER — FLUCONAZOLE 100MG IVPB: 100.0000 mg | INTRAVENOUS | Status: DC

## 2020-10-20 MED ORDER — FLUTICASONE PROPIONATE 50 MCG/ACT NA SUSP
1.0000 | Freq: Every day | NASAL | Status: DC
  Administered 2020-10-21 – 2020-10-27 (×7): 1 via NASAL
  Filled 2020-10-20: qty 16

## 2020-10-20 MED ORDER — HYDRALAZINE HCL 50 MG PO TABS: 25.0000 mg | ORAL_TABLET | Freq: Three times a day (TID) | ORAL | Status: DC | PRN

## 2020-10-20 MED ORDER — SODIUM CHLORIDE 0.9 % IV SOLN: 1.0000 g | INTRAVENOUS | Status: DC

## 2020-10-20 MED ORDER — FLUCONAZOLE 100MG IVPB
100.0000 mg | INTRAVENOUS | Status: DC
  Administered 2020-10-20: 100 mg via INTRAVENOUS
  Filled 2020-10-20 (×2): qty 50

## 2020-10-20 NOTE — ED Provider Notes (Signed)
[facility] Emergency Department Provider Note  ____________________________________________   Event Date/Time   First MD Initiated Contact with Patient 10/20/20 1221     (approximate)  I have reviewed the triage vital signs and the nursing notes.   HISTORY  Chief Complaint Altered Mental Status and Abnormal Lab    HPI Kaitlin Wagner is a 75 y.o. female with diabetes, hypertension, hyperlipidemia who comes in from SNF for altered mental status and abnormal lab.  To note patient does have a history of dementia.  Patient does have a sacral wound as well.  Patient has a Foley that was last changed on Friday due to it being blocked.  Facility is concerned that patient has had decreased p.o. intake and change in her mental status and that her kidney function was 3.6 and BUN was 113 therefore was treated to the ER to be evaluated.  Patient is DNR but family is open to IV hydration and recheck of electrolytes.  Patient is not currently on Diflucan as well for yeast.  Patient is alert and oriented x2 but is unclear why she is here.  Unable to get full HPI due to mental status.  However she does decline any chest pain, abdominal pain or any other concerns.          Past Medical History:  Diagnosis Date  . Cancer (HCC) colon  . Diabetes mellitus without complication (HCC)   . GERD (gastroesophageal reflux disease)   . Hyperlipidemia   . Hypertension     There are no problems to display for this patient.   Past Surgical History:  Procedure Laterality Date  . CHOLECYSTECTOMY    . colon resection     laparoscopic  . COLON SURGERY    . COLONOSCOPY WITH PROPOFOL N/A 05/12/2015   Procedure: COLONOSCOPY WITH PROPOFOL;  Surgeon: Wallace Cullens, MD;  Location: [facility] ENDOSCOPY;  Service: Gastroenterology;  Laterality: N/A;  . COLONOSCOPY WITH PROPOFOL N/A 05/12/2018   Procedure: COLONOSCOPY WITH PROPOFOL;  Surgeon: Toledo, Boykin Nearing, MD;  Location: [facility] ENDOSCOPY;   Service: Gastroenterology;  Laterality: N/A;  . ESOPHAGOGASTRODUODENOSCOPY N/A 05/12/2015   Procedure: ESOPHAGOGASTRODUODENOSCOPY (EGD);  Surgeon: Wallace Cullens, MD;  Location: [facility] ENDOSCOPY;  Service: Gastroenterology;  Laterality: N/A;    Prior to Admission medications   Medication Sig Start Date End Date Taking? Authorizing Provider  amLODipine-atorvastatin (CADUET) 10-40 MG tablet Take 1 tablet by mouth daily.    [provider]  ammonium lactate (AMLACTIN) 12 % cream Apply topically 2 (two) times daily.    [provider]  aspirin EC 81 MG tablet Take 81 mg by mouth daily.    [provider]  atorvastatin (LIPITOR) 40 MG tablet Take 40 mg by mouth daily.    [provider]  cholecalciferol (VITAMIN D) 400 UNITS TABS tablet Take 1,000 Units by mouth.    [provider]  fluticasone (FLONASE) 50 MCG/ACT nasal spray Place 1 spray into both nostrils daily.    [provider]  lisinopril-hydrochlorothiazide (PRINZIDE,ZESTORETIC) 20-12.5 MG tablet Take 2 tablets by mouth daily.    [provider]  loperamide (IMODIUM) 2 MG capsule Take by mouth 4 (four) times daily as needed for diarrhea or loose stools.    [provider]  metoprolol succinate (TOPROL-XL) 25 MG 24 hr tablet Take 25 mg by mouth daily.    [provider]  pioglitazone (ACTOS) 15 MG tablet Take 15 mg by mouth daily.    [provider]  Triamcinolone Acetonide (TRIAMCINOLONE 0.1 % CREAM : EUCERIN) CREA Apply 1 application topically as needed.    [provider]  venlafaxine (EFFEXOR) 25 MG tablet Take 25 mg by mouth 2 (two) times daily.    [provider]  zinc oxide 20 % ointment Apply 1 application topically as needed for irritation.    [provider]    Allergies Patient has no known allergies.  No family history on file.  Social History Social History   Tobacco Use  . Smoking status: Never Smoker  .  Smokeless tobacco: Never Used  Vaping Use  . Vaping Use: Never used  Substance Use Topics  . Alcohol use: No  . Drug use: No      Review of Systems Unable to get full review of systems due to patient's mental status ____________________________________________   PHYSICAL EXAM:  VITAL SIGNS: ED Triage Vitals [10/20/20 1222]  Enc Vitals Group     BP      Pulse      Resp      Temp      Temp src      SpO2      Weight 160 lb (72.6 kg)     Height 5\' 1"  (1.549 m)     Head Circumference      Peak Flow      Pain Score 0     Pain Loc      Pain Edu?      Excl. in [place]?     Constitutional: Alert and oriented x2. Well appearing and in no acute distress. Eyes: Conjunctivae are normal. EOMI. Head: Atraumatic. Nose: No congestion/rhinnorhea. Mouth/Throat: Mucous membranes are moist.   Neck: No stridor. Trachea Midline. FROM Cardiovascular: Normal rate, regular rhythm. Grossly normal heart sounds.  Good peripheral circulation. Respiratory: Normal respiratory effort.  No retractions. Lungs CTAB. Gastrointestinal: Abdomen is distended.  Unclear if any pain no abdominal bruits.  Musculoskeletal: No lower extremity tenderness nor edema.  No joint effusions. Neurologic:  Normal speech and language. No gross focal neurologic deficits are appreciated.  Skin:  Skin is warm, dry and intact. No rash noted. Psychiatric: Mood and affect are normal. Speech and behavior are normal. GU: Deferred   ____________________________________________   LABS (all labs ordered are listed, but only abnormal results are displayed)  Labs Reviewed  COMPREHENSIVE METABOLIC PANEL - Abnormal; Notable for the following components:      Result Value   CO2 18 (*)    Glucose, Bld 107 (*)    BUN 122 (*)    Creatinine, Ser 3.49 (*)    Total Protein 9.2 (*)    GFR, Estimated 13 (*)    All other components within normal limits  CBC - Abnormal; Notable for the following components:   WBC 13.4 (*)    RDW 17.0  (*)    All other components within normal limits  URINALYSIS, COMPLETE (UACMP) WITH MICROSCOPIC - Abnormal; Notable for the following components:   Color, Urine YELLOW (*)    APPearance CLOUDY (*)    Hgb urine dipstick LARGE (*)    Protein, ur 30 (*)    Leukocytes,Ua LARGE (*)    RBC / HPF >50 (*)    WBC, UA >50 (*)    Bacteria, UA MANY (*)    All other components within normal limits  CBG MONITORING, ED - Abnormal; Notable for the following components:   Glucose-Capillary 117 (*)    All other components within normal limits  RESP PANEL  BY RT-PCR (FLU A&B, COVID) [id number]  URINE CULTURE   ____________________________________________   ED ECG REPORT I, Vanessa [place], the attending physician, personally viewed and interpreted this ECG.  EKG is sinus rate of 82, no ST elevation, T wave version in lead II, normal intervals ____________________________________________  RADIOLOGY Robert Bellow, personally viewed and evaluated these images (plain radiographs) as part of my medical decision making, as well as reviewing the written report by the radiologist.  ED MD interpretation:  pending  Official radiology report(s): No results found.  ____________________________________________   PROCEDURES  Procedure(s) performed (including Critical Care):  .1-3 Lead EKG Interpretation Performed by: Vanessa Burns, MD Authorized by: Vanessa Arena, MD     Interpretation: normal     ECG rate:  80s    ECG rate assessment: normal     Rhythm: sinus rhythm     Ectopy: none     Conduction: normal    Ultrasound ED Peripheral IV (Provider)  Date/Time: 10/20/2020 1:57 PM Performed by: Vanessa [place], MD Authorized by: Vanessa [place], MD   Procedure details:    Indications: hydration and multiple failed IV attempts     Skin Prep: chlorhexidine gluconate     Location:  Left AC   Angiocath:  20 G   Bedside Ultrasound Guided: Yes     Images: not archived     Patient tolerated procedure  without complications: Yes     Dressing applied: Yes       ____________________________________________   INITIAL IMPRESSION / ASSESSMENT AND PLAN / ED COURSE  Sameen Leas was evaluated in Emergency Department on 10/20/2020 for the symptoms described in the history of present illness. She was evaluated in the context of the global COVID-19 pandemic, which necessitated consideration that the patient might be at risk for infection with the SARS-CoV-2 virus that causes COVID-19. Institutional protocols and algorithms that pertain to the evaluation of patients at risk for COVID-19 are in a state of rapid change based on information released by regulatory bodies including the CDC and federal and state organizations. These policies and algorithms were followed during the patient's care in the ED.    Patient is a 75 year old who comes in with abnormal labs of elevated BUN and creatinine most likely causing her change in mental status.  No report of any falls to suggest intracranial hemorrhage patient is moving all her extremities well.  Will get labs to confirm electrolyte abnormalities.  Will get UA to evaluate for UTI but seems less likely given patient is afebrile.  Abdomen is soft and nontender and low suspicion for acute abdominal process but will get a bladder scan just to make sure that she is not still retaining that could be causing her kidney function to be elevated.  We will keep patient the cardiac monitoring in case she has electrolyte abnormalities.  Will give patient fluids given it sounds like patient had recent GI bug which could have precipitated patient's worsening kidney function  Labs show BUN of 122 and creatinine of 3.49.  Patient is getting a liter of fluid.  Urine looks like she might have a UTI but patient does have a catheter in place.  We will send for culture and get CT scan given patient's abdominal distention to see if there is any signs of cystitis or obstruction.    I  did discuss with patients daughter who is okay with patient being admitted to the hospital for IV fluids.  Discussed with  her that sometimes if the IV fluids are not working that we have to do dialysis.  They expressed understanding but they are not sure yet if they would be open to that.  Daughter does state that patient does have a hernia and thinks that that distention is baseline for her.  However we will still proceed with CT to make sure no evidence of obstruction.  Did do a bladder scan that did not show an obstruction from the Foley catheter.  Will discuss with hospital team for admission       ____________________________________________   FINAL CLINICAL IMPRESSION(S) / ED DIAGNOSES   Final diagnoses:  AKI (acute kidney injury) ([place])  Elevated BUN  Confusion      MEDICATIONS GIVEN DURING THIS VISIT:  Medications  sodium chloride 0.9 % bolus 1,000 mL (1,000 mLs Intravenous New Bag/Given 10/20/20 1404)     ED Discharge Orders    None       Note:  This document was prepared using Dragon voice recognition software and may include unintentional dictation errors.   Vanessa Garrett, MD 10/20/20 [phone]

## 2020-10-20 NOTE — Consult Note (Addendum)
Pharmacy Antibiotic Note  Kaitlin Wagner is a 75 y.o. female admitted on 10/20/2020 with altered mental status. CT scan  showed signs concerning for small bowel pneumatosis and small amount of free air between loops of bowel. Pharmacy has been consulted for zosyn dosing.  Plan: Zosyn 2.25 gram IV q8h based on current renal function  Height: 5\' 1"  (154.9 cm) Weight: 72.6 kg (160 lb) IBW/kg (Calculated) : 47.8  Temp (24hrs), Avg:97.5 F (36.4 C), Min:96.6 F (35.9 C), Max:98.1 F (36.7 C)  Recent Labs  Lab 10/20/20 1356  WBC 13.4*  CREATININE 3.49*    Estimated Creatinine Clearance: 12.7 mL/min (A) (by C-G formula based on SCr of 3.49 mg/dL (H)).    No Known Allergies  Antimicrobials this admission: Zosyn 4/22 >>   Dose adjustments this admission: n/a  Microbiology results: 4/22 UCx: sent    Thank you for allowing pharmacy to be a part of this patient's care.  Dorothe Pea, PharmD, BCPS 10/20/2020 6:42 PM

## 2020-10-20 NOTE — ED Notes (Signed)
Patient transported to inpatient unit, patient's wheelchair transported to inpatient unit by patient relations.

## 2020-10-20 NOTE — ED Notes (Signed)
Transport requested

## 2020-10-20 NOTE — H&P (Addendum)
History and Physical   Mirza Kidney NUU:[id number] DOB: 1946-01-28 DOA: 10/20/2020  PCP: [facility]  Patient coming from: Facility  I have personally briefly reviewed patient's old medical records in [place].  Chief Concern: Altered mental status  HPI: Kaitlin Wagner is a 75 y.o. female with medical history significant for advance dementia, weight lost, Barretts esophagus, sacral decubitus ulcer, presents to the emergency department for chief concerns of altered mentation.  At bedside, she is able to tell me her full name. She was not able to tell me her age (attempted 59 and then 40), current year, current location, current president of the [place].  On physical exam patient states she is very cold and wants to be comfortable.  With abdominal light palpation she tries to push my hand away and states that she is cold and wants to be covered up again.  Per facility, patient has had poor Po intake since Saturday 10/14/20.   Social history: lives at full time facility.  Vaccinations: Patient states she is vaccinated for COVID-19  ROS: Unable to complete as patient has advanced dementia Constitutional: no weight change, no fever ENT/Mouth: no sore throat, no rhinorrhea Eyes: no eye pain, no vision changes Cardiovascular: no chest pain, no dyspnea,  no edema, no palpitations Respiratory: no cough, no sputum, no wheezing Gastrointestinal: no nausea, no vomiting, no diarrhea, no constipation Genitourinary: no urinary incontinence, no dysuria, no hematuria Musculoskeletal: no arthralgias, no myalgias Skin: no skin lesions, no pruritus, Neuro: + weakness, no loss of consciousness, no syncope Psych: no anxiety, no depression, + decrease appetite Heme/Lymph: no bruising, no bleeding  ED Course: Discussed with ED provider, patient requiring hospitalization due to acute kidney injury and meeting SIRS criteria.  Vitals in the emergency department was remarkable for  temperature of 96.6, respiration rate of 27, blood pressure 127/71, heart rate 115, SPO2 at 99% on room air.  Labs in the emergency department was remarkable for sodium level of 137, potassium 4.1, chloride 107, bicarb 18, BUN 122, serum creatinine 3.49, nonfasting blood glucose 107, WBC 13.4, hemoglobin 14, platelets 392.  EGFR 13, COVID was negative, UA showed large leukocytes.  EDP ordered normal saline 1 L bolus and CT abdomen and pelvis without contrast.  I spoke with geriatrician, Dr. Wilford Grist regarding Kaitlin Wagner.  Assessment/Plan  Principal Problem:   AKI (acute kidney injury) ([place]) Active Problems:   Advanced dementia ([place])   GERD (gastroesophageal reflux disease)   Barrett esophagus   Sacral wound, initial encounter   # Meets severe sepsis criteria - Patient maintaining appropriate MAP therefore sepsis bolus is not indicated at this time - Renal, leukocytosis, hypothermia, with source of urine - UA was positive for leukocytes and large amounts - Work-up in progress - Apply warming blanket ordered and with repeat vital check, her temperature improved and warming blanket no longer required - Ceftriaxone 1 g IV daily was initially ordered however given the new CT imaging, I have broadened the antibiotic to Zosyn (renal adjustment) - CBC in the a.m.  # Abdominal distention -CT abdomen and pelvis without contrast-small bowel pneumatosis and free air in the mid abdomen, suspicious for ischemia.  No portal venous or mesenteric venous gas - General surgery has been consulted, Dr. Hampton Abbot via secure chat -Zosyn per pharmacy, Zosyn 2.25 g IV every 8 hours ordered, dosing has been adjusted due to AKI with creatinine clearance of 12.7  # Acute kidney injury-suspect secondary to prerenal, in setting of poor  p.o. intake in setting of oral thrush - Serum creatinine on presentation is 3.49, eGFR 13 -Per PCP printed notes uploaded into media, previous 3 serum creatinine was 0.73  (08/22/20), 1.01 (05/03/2020), 1.57 (04/28/20) - Recent labs is not available in EHR, monitor serum creatinine in 2016 was 1 - Status post 1 L NS bolus per ED - LR IVF at 125 mL/h, 1 day ordered - BMP in the a.m.  # Oral thrush-status post 4 days of Diflucan outpatient treatments, Diflucan 100 mg IV every 48 hours, renally dose  # Previous diagnosis of depression/anxiety- previously on venlafaxine 25 mg twice daily, however this has been discontinued by her PCP # History of hypertension-- no longer requiring antihypertensives, hydralazine 25 mg q8h as needed in place for sbp > 185 # History of hyperlipidemia- no longer taking  # Presence of indwelling Foley catheter- Per geriatrician note, Foley was placed 10/13/2020 for assistance with wound healing  # Calorie and protein malnutrition- Consult to registered dietitian placed  # Sacral decubitus ulcer-present on admission, wound consult placed  # At risk for aspiration- SLP ordered, n.p.o. except for ice chips and sips with meds, aspiration precautions  As needed medications: Ondansetron, acetaminophen, hydralazine  Fall precautions, aspiration precautions  Chart reviewed.  Reviewed [facility] [employer].  Patient's geriatrician is Dr. Leward Quan and she states that she can be reached at [phone] should we have any questions regarding Kaitlin Wagner.   DVT prophylaxis: Heparin 5000 units every 8 hours Code Status: DNR Diet: N.p.o. except for sips with meds and ice chips, SLP ordered Family Communication: Updated daughter, Kaitlin Wagner over the phone.  Disposition Plan: Pending clinical course Consults called: General surgery Admission status: MedSurg, observation, telemetry  Past Medical History:  Diagnosis Date  . Cancer ([place]) colon  . Diabetes mellitus without complication ([place])   . GERD (gastroesophageal reflux disease)   . Hyperlipidemia   . Hypertension    Past Surgical History:  Procedure Laterality  Date  . CHOLECYSTECTOMY    . colon resection     laparoscopic  . COLON SURGERY    . COLONOSCOPY WITH PROPOFOL N/A 05/12/2015   Procedure: COLONOSCOPY WITH PROPOFOL;  Surgeon: Hulen Luster, MD;  Location: [facility] ENDOSCOPY;  Service: Gastroenterology;  Laterality: N/A;  . COLONOSCOPY WITH PROPOFOL N/A 05/12/2018   Procedure: COLONOSCOPY WITH PROPOFOL;  Surgeon: Tol[place], Benay Pike, MD;  Location: [facility] ENDOSCOPY;  Service: Gastroenterology;  Laterality: N/A;  . ESOPHAGOGASTRODUODENOSCOPY N/A 05/12/2015   Procedure: ESOPHAGOGASTRODUODENOSCOPY (EGD);  Surgeon: Hulen Luster, MD;  Location: [facility] ENDOSCOPY;  Service: Gastroenterology;  Laterality: N/A;   Social History:  reports that she has never smoked. She has never used smokeless tobacco. She reports that she does not drink alcohol and does not use drugs.  No Known Allergies No family history on file. Family history: Family history reviewed and not pertinent  Prior to Admission medications   Medication Sig Start Date End Date Taking? Authorizing Provider  amLODipine-atorvastatin (CADUET) 10-40 MG tablet Take 1 tablet by mouth daily.    [provider]  ammonium lactate (AMLACTIN) 12 % cream Apply topically 2 (two) times daily.    [provider]  aspirin EC 81 MG tablet Take 81 mg by mouth daily.    [provider]  atorvastatin (LIPITOR) 40 MG tablet Take 40 mg by mouth daily.    [provider]  cholecalciferol (VITAMIN D) 400 UNITS TABS tablet Take 1,000 Units by mouth.    [provider]  fluticasone (FLONASE) 50 MCG/ACT nasal spray Place 1 spray into both nostrils daily.    [provider]  lisinopril-hydrochlorothiazide (PRINZIDE,ZESTORETIC) 20-12.5 MG tablet Take 2 tablets by mouth daily.    [provider]  loperamide (IMODIUM) 2 MG capsule Take by mouth 4 (four) times daily as needed for diarrhea or loose stools.    [provider]  metoprolol succinate (TOPROL-XL) 25 MG  24 hr tablet Take 25 mg by mouth daily.    [provider]  pioglitazone (ACTOS) 15 MG tablet Take 15 mg by mouth daily.    [provider]  Triamcinolone Acetonide (TRIAMCINOLONE 0.1 % CREAM : EUCERIN) CREA Apply 1 application topically as needed.    [provider]  venlafaxine (EFFEXOR) 25 MG tablet Take 25 mg by mouth 2 (two) times daily.    [provider]  zinc oxide 20 % ointment Apply 1 application topically as needed for irritation.    [provider]   Physical Exam: Vitals:   10/20/20 1222 10/20/20 1227 10/20/20 1430 10/20/20 1544  BP:   127/71 (!) 126/53  Pulse:  87 77 75  Resp:  20 18 16   Temp:  (!) 96.6 F (35.9 C)  97.7 F (36.5 C)  TempSrc:  Axillary  Oral  SpO2:  100% 100% 100%  Weight: 72.6 kg     Height: 5' 1"  (1.549 m)      Constitutional: appears age appropriate, frail, cachectic, NAD, calm, comfortable Eyes: PERRL, lids and conjunctivae normal ENMT: Mucous membranes are moist. Posterior pharynx clear of any exudate or lesions. Age-appropriate dentition. Hearing appropriate Neck: normal, supple, no masses, no thyromegaly Respiratory: clear to auscultation bilaterally, no wheezing, no crackles. Normal respiratory effort. No accessory muscle use.  Cardiovascular: Regular rate and rhythm, no murmurs / rubs / gallops. No extremity edema. 2+ pedal pulses. No carotid bruits.  Abdomen: Distended abdomen, mild/moderate tenderness with normal palpation, no masses palpated, no hepatosplenomegaly.  No sounds positive.  Musculoskeletal: no clubbing / cyanosis. No joint deformity upper and lower extremities. Good ROM, no contractures, no atrophy. Normal muscle tone.  Skin: no rashes, lesions, ulcers. No induration Neurologic: Sensation intact. Strength 4/5 in all 4.  Psychiatric: Normal judgment and insight. Alert and oriented x self only. Normal mood.   EKG: independently reviewed, showing sinus rhythm with rate of 82, QTc  442  Chest x-ray on Admission: I personally reviewed and I agree with radiologist reading as below.  CT ABDOMEN PELVIS WO CONTRAST  Result Date: 10/20/2020 CLINICAL DATA:  Abdominal distension. Nursing home patient with dementia and sacral decubitus wound. EXAM: CT ABDOMEN AND PELVIS WITHOUT CONTRAST TECHNIQUE: Multidetector CT imaging of the abdomen and pelvis was performed following the standard protocol without IV contrast. COMPARISON:  Most recent CT 07/30/2011 FINDINGS: Lower chest: Mild cardiomegaly. Coronary artery calcifications. Atherosclerosis and tortuosity of the included thoracic aorta. Subsegmental atelectasis in the lingula and left lower lobe. No pleural effusion or acute airspace disease. Hepatobiliary: Elongated right lobe of the liver. No evidence of focal liver abnormality on this noncontrast exam. Cholecystectomy. No obvious biliary dilatation, motion obscures detailed assessment. There is no portal venous gas. Pancreas: Parenchymal atrophy. No ductal dilatation or inflammation. Spleen: Normal in size without focal abnormality. Adrenals/Urinary Tract: No adrenal nodule. 6 mm nonobstructing stone in the lower left kidney. 3 mm nonobstructing stone in the lower right kidney. There is no hydronephrosis. Probable cyst in the upper right kidney, series 2, image 26, obscured by motion. Minor symmetric perinephric  edema. No ureteral calculi. Foley catheter decompresses the urinary bladder. Equivocal bladder wall thickening. Small amount of air in the bladder is likely related to catheter. Stomach/Bowel: Bowel assessment is limited in the absence of enteric contrast as well as patient motion artifact. Diffuse gaseous distension of small bowel loops. There are areas of small bowel pneumatosis as well as free air in the adjacent mid abdomen, detailed assessment obscured by motion, however for example series 2, image 45. Pneumatosis appears to involve a moderate segment of small bowel. Occasional  small air-fluid levels. Patient is post at least partial colectomy with sigmoid colon remaining in situ, and chain sutures noted in the left abdomen. Stool in the distal colon. Vascular/Lymphatic: Advanced aortic and branch atherosclerosis. No aortic aneurysm. There is no portal venous or mesenteric venous gas. Cannot assess for small-vessel mesenteric gas given motion. No bulky abdominopelvic adenopathy. Reproductive: Unenhanced uterus grossly normal.  No adnexal mass. Other: No significant ascites or free fluid. Small volume of free air suspected in the mid abdomen adjacent to small bowel pneumatosis, without tracking under the hemidiaphragms. Musculoskeletal: Skin defect consistent sacral decubitus ulcer extends to the sacrum and coccyx. There is no obvious bony resorption or destruction. No focal fluid collection. Laxity of anterior abdominal musculature. Bones are diffusely under mineralized with diffuse degenerative change throughout the spine. Gluteal and paravertebral muscle fatty atrophy. IMPRESSION: 1. Small bowel pneumatosis and free air in the mid abdomen, detailed assessment obscured by motion artifact. This is suspicious for bowel ischemia. No portal venous or mesenteric venous gas. 2. Skin defect consistent with sacral decubitus ulcer extends to the sacrum and coccyx. No obvious bony resorption or destruction. 3. Bilateral nonobstructing renal calculi. Aortic Atherosclerosis (ICD10-I70.0). These results were called by telephone at the time of interpretation on 10/20/2020 at 3:44 pm to Dr Ellender Hose, who verbally acknowledged these results. Electronically Signed   By: Keith Rake M.D.   On: 10/20/2020 15:44   Labs on Admission: I have personally reviewed following labs  CBC: Recent Labs  Lab 10/20/20 1356  WBC 13.4*  HGB 14.0  HCT 41.2  MCV 83.1  PLT 672   Basic Metabolic Panel: Recent Labs  Lab 10/20/20 1356  NA 137  K 4.1  CL 107  CO2 18*  GLUCOSE 107*  BUN 122*  CREATININE  3.49*  CALCIUM 10.3   GFR: Estimated Creatinine Clearance: 12.7 mL/min (A) (by C-G formula based on SCr of 3.49 mg/dL (H)).  Liver Function Tests: Recent Labs  Lab 10/20/20 1356  AST 16  ALT 23  ALKPHOS 118  BILITOT 1.0  PROT 9.2*  ALBUMIN 4.4   CBG: Recent Labs  Lab 10/20/20 1259  GLUCAP 117*   Urine analysis:    Component Value Date/Time   COLORURINE YELLOW (A) 10/20/2020 1252   APPEARANCEUR CLOUDY (A) 10/20/2020 1252   LABSPEC 1.014 10/20/2020 1252   PHURINE 5.0 10/20/2020 1252   GLUCOSEU NEGATIVE 10/20/2020 1252   HGBUR LARGE (A) 10/20/2020 1252   BILIRUBINUR NEGATIVE 10/20/2020 1252   [place] 10/20/2020 1252   PROTEINUR 30 (A) 10/20/2020 1252   NITRITE NEGATIVE 10/20/2020 1252   LEUKOCYTESUR LARGE (A) 10/20/2020 1252   CRITICAL CARE Performed by: Briant Cedar Jodene Polyak  Total critical care time: 35 minutes  Critical care time was exclusive of separately billable procedures and treating other patients.  Critical care was necessary to treat or prevent imminent or life-threatening deterioration. Sepsis, severe sepsis, acute abdomen  Critical care was time spent personally by me on the  following activities: development of treatment plan with patient and/or surrogate as well as nursing, discussions with consultants, evaluation of patient's response to treatment, examination of patient, obtaining history from patient or surrogate, ordering and performing treatments and interventions, ordering and review of laboratory studies, ordering and review of radiographic studies, pulse oximetry and re-evaluation of patient's condition.  Morine Kohlman N Rhianon Zabawa D.O. [facility]  If 7PM-7AM, please contact overnight-coverage provider If 7AM-7PM, please contact day coverage provider [email or web address]  10/20/2020, 4:05 PM

## 2020-10-20 NOTE — ED Notes (Addendum)
Could not visualize bladder during bladder scan. Jari Pigg, MD notified. MD will perform ultrasound. Supplies at bedside.

## 2020-10-20 NOTE — ED Notes (Signed)
Patient transported to CT 

## 2020-10-20 NOTE — Consult Note (Signed)
Date of Consultation:  10/20/2020  Requesting Physician:  Rupert Stacks, DO  Reason for Consultation:  Pneumatosis and pneumoperitoneum  History of Present Illness: Kaitlin Wagner is a 75 y.o. female admitted today to the hospital with altered mental status.  She does have a history of dementia and currently she's only oriented to person.  While speaking to her daughter, she reports that her po intake has been decreased over the last few days and did not feel good.  However, today apparently she started taking better po intake.  But the labwork that had been done showed AKI with Cr of 3.6 and BUN 113.  In the ER, her vital signs have been normal and stable.  Her labwork confirmed AKI with Cr 3.49, BUN 122, and also she has a WBC of 13.4 and U/A with large leukocyts, many bacteria and >50 WBC.  COVID-19 test negative.  Lactic acid is currently pending.  She did appear distended on exam, and CT scan was obtained, which showed signs concerning for small bowel pneumatosis and small amount of free air between loops of bowel.  Her small bowel is distended as well.  I can also appreciate a large ventral hernia with loss of domain, with the rectus muscles being very lateral.  Surgery was consulted for evaluation.  She does have a history of colon cancer and had a partial colectomy in the past as well as cholecystectomy  Past Medical History: Past Medical History:  Diagnosis Date  . Cancer ([place]) colon  . Diabetes mellitus without complication ([place])   . GERD (gastroesophageal reflux disease)   . Hyperlipidemia   . Hypertension      Past Surgical History: Past Surgical History:  Procedure Laterality Date  . CHOLECYSTECTOMY    . colon resection     laparoscopic  . COLON SURGERY    . COLONOSCOPY WITH PROPOFOL N/A 05/12/2015   Procedure: COLONOSCOPY WITH PROPOFOL;  Surgeon: Hulen Luster, MD;  Location: [facility] ENDOSCOPY;  Service: Gastroenterology;  Laterality: N/A;  . COLONOSCOPY WITH PROPOFOL N/A 05/12/2018    Procedure: COLONOSCOPY WITH PROPOFOL;  Surgeon: Toledo, Benay Pike, MD;  Location: [facility] ENDOSCOPY;  Service: Gastroenterology;  Laterality: N/A;  . ESOPHAGOGASTRODUODENOSCOPY N/A 05/12/2015   Procedure: ESOPHAGOGASTRODUODENOSCOPY (EGD);  Surgeon: Hulen Luster, MD;  Location: [facility] ENDOSCOPY;  Service: Gastroenterology;  Laterality: N/A;    Home Medications: Prior to Admission medications   Medication Sig Start Date End Date Taking? Authorizing Provider  amLODipine-atorvastatin (CADUET) 10-40 MG tablet Take 1 tablet by mouth daily.    [provider]  ammonium lactate (AMLACTIN) 12 % cream Apply topically 2 (two) times daily.    [provider]  aspirin EC 81 MG tablet Take 81 mg by mouth daily.    [provider]  atorvastatin (LIPITOR) 40 MG tablet Take 40 mg by mouth daily.    [provider]  cholecalciferol (VITAMIN D) 400 UNITS TABS tablet Take 1,000 Units by mouth.    [provider]  fluticasone (FLONASE) 50 MCG/ACT nasal spray Place 1 spray into both nostrils daily.    [provider]  lisinopril-hydrochlorothiazide (PRINZIDE,ZESTORETIC) 20-12.5 MG tablet Take 2 tablets by mouth daily.    [provider]  loperamide (IMODIUM) 2 MG capsule Take by mouth 4 (four) times daily as needed for diarrhea or loose stools.    [provider]  metoprolol succinate (TOPROL-XL) 25 MG 24 hr tablet Take 25 mg by mouth daily.    [provider]  pioglitazone (  ACTOS) 15 MG tablet Take 15 mg by mouth daily.    [provider]  Triamcinolone Acetonide (TRIAMCINOLONE 0.1 % CREAM : EUCERIN) CREA Apply 1 application topically as needed.    [provider]  venlafaxine (EFFEXOR) 25 MG tablet Take 25 mg by mouth 2 (two) times daily.    [provider]  zinc oxide 20 % ointment Apply 1 application topically as needed for irritation.    [provider]    Allergies: No Known Allergies  Social  History:  reports that she has never smoked. She has never used smokeless tobacco. She reports that she does not drink alcohol and does not use drugs.   Family History: No family history on file.  Review of Systems: Review of Systems  Unable to perform ROS: Dementia    Physical Exam BP 129/60 (BP Location: Right Arm)   Pulse 77   Temp 98.1 F (36.7 C)   Resp 18   Ht 5\' 1"  (1.549 m)   Wt 72.6 kg   SpO2 100%   BMI 30.23 kg/m  CONSTITUTIONAL: No acute distress, appears comfortable. HEENT:  Normocephalic, atraumatic, extraocular motion intact. NECK: Trachea is midline, and there is no jugular venous distension. RESPIRATORY:  Lungs are clear, and breath sounds are equal bilaterally. Normal respiratory effort without pathologic use of accessory muscles. CARDIOVASCULAR: Heart is regular without murmurs, gallops, or rubs. GI: The abdomen is soft, distended, non-tender to palpation.  Despite of pressing deep, her only complaint was cold hands, but there's no grimacing or guarding. Patient has a low midline incision.   MUSCULOSKELETAL:  Normal muscle strength and tone in all four extremities.  No peripheral edema or cyanosis. SKIN: Skin turgor is normal. There are no pathologic skin lesions.  NEUROLOGIC:  Motor and sensation is grossly normal.  Cranial nerves are grossly intact. PSYCH:  Alert and oriented to person, place and time. Affect is normal.  Laboratory Analysis: Results for orders placed or performed during the hospital encounter of 10/20/20 (from the past 24 hour(s))  Resp Panel by RT-PCR (Flu A&B, Covid) Nasopharyngeal Swab     Status: None   Collection Time: 10/20/20 12:52 PM   Specimen: Nasopharyngeal Swab; Nasopharyngeal(NP) swabs in vial transport medium  Result Value Ref Range   SARS Coronavirus 2 by RT PCR NEGATIVE NEGATIVE   Influenza A by PCR NEGATIVE NEGATIVE   Influenza B by PCR NEGATIVE NEGATIVE  Urinalysis, Complete w Microscopic     Status: Abnormal    Collection Time: 10/20/20 12:52 PM  Result Value Ref Range   Color, Urine YELLOW (A) YELLOW   APPearance CLOUDY (A) CLEAR   Specific Gravity, Urine 1.014 1.005 - 1.030   pH 5.0 5.0 - 8.0   Glucose, UA NEGATIVE NEGATIVE mg/dL   Hgb urine dipstick LARGE (A) NEGATIVE   Bilirubin Urine NEGATIVE NEGATIVE   Ketones, ur NEGATIVE NEGATIVE mg/dL   Protein, ur 30 (A) NEGATIVE mg/dL   Nitrite NEGATIVE NEGATIVE   Leukocytes,Ua LARGE (A) NEGATIVE   RBC / HPF >50 (H) 0 - 5 RBC/hpf   WBC, UA >50 (H) 0 - 5 WBC/hpf   Bacteria, UA MANY (A) NONE SEEN   Squamous Epithelial / LPF 0-5 0 - 5   WBC Clumps PRESENT   CBG monitoring, ED     Status: Abnormal   Collection Time: 10/20/20 12:59 PM  Result Value Ref Range   Glucose-Capillary 117 (H) 70 - 99 mg/dL  Comprehensive metabolic panel     Status: Abnormal  Collection Time: 10/20/20  1:56 PM  Result Value Ref Range   Sodium 137 135 - 145 mmol/L   Potassium 4.1 3.5 - 5.1 mmol/L   Chloride 107 98 - 111 mmol/L   CO2 18 (L) 22 - 32 mmol/L   Glucose, Bld 107 (H) 70 - 99 mg/dL   BUN 122 (H) 8 - 23 mg/dL   Creatinine, Ser 3.49 (H) 0.44 - 1.00 mg/dL   Calcium 10.3 8.9 - 10.3 mg/dL   Total Protein 9.2 (H) 6.5 - 8.1 g/dL   Albumin 4.4 3.5 - 5.0 g/dL   AST 16 15 - 41 U/L   ALT 23 0 - 44 U/L   Alkaline Phosphatase 118 38 - 126 U/L   Total Bilirubin 1.0 0.3 - 1.2 mg/dL   GFR, Estimated 13 (L) >60 mL/min   Anion gap 12 5 - 15  CBC     Status: Abnormal   Collection Time: 10/20/20  1:56 PM  Result Value Ref Range   WBC 13.4 (H) 4.0 - 10.5 K/uL   RBC 4.96 3.87 - 5.11 MIL/uL   Hemoglobin 14.0 12.0 - 15.0 g/dL   HCT 41.2 36.0 - 46.0 %   MCV 83.1 80.0 - 100.0 fL   MCH 28.2 26.0 - 34.0 pg   MCHC 34.0 30.0 - 36.0 g/dL   RDW 17.0 (H) 11.5 - 15.5 %   Platelets 392 150 - 400 K/uL   nRBC 0.0 0.0 - 0.2 %    Imaging: CT ABDOMEN PELVIS WO CONTRAST  Result Date: 10/20/2020 CLINICAL DATA:  Abdominal distension. Nursing home patient with dementia and sacral  decubitus wound. EXAM: CT ABDOMEN AND PELVIS WITHOUT CONTRAST TECHNIQUE: Multidetector CT imaging of the abdomen and pelvis was performed following the standard protocol without IV contrast. COMPARISON:  Most recent CT 07/30/2011 FINDINGS: Lower chest: Mild cardiomegaly. Coronary artery calcifications. Atherosclerosis and tortuosity of the included thoracic aorta. Subsegmental atelectasis in the lingula and left lower lobe. No pleural effusion or acute airspace disease. Hepatobiliary: Elongated right lobe of the liver. No evidence of focal liver abnormality on this noncontrast exam. Cholecystectomy. No obvious biliary dilatation, motion obscures detailed assessment. There is no portal venous gas. Pancreas: Parenchymal atrophy. No ductal dilatation or inflammation. Spleen: Normal in size without focal abnormality. Adrenals/Urinary Tract: No adrenal nodule. 6 mm nonobstructing stone in the lower left kidney. 3 mm nonobstructing stone in the lower right kidney. There is no hydronephrosis. Probable cyst in the upper right kidney, series 2, image 26, obscured by motion. Minor symmetric perinephric edema. No ureteral calculi. Foley catheter decompresses the urinary bladder. Equivocal bladder wall thickening. Small amount of air in the bladder is likely related to catheter. Stomach/Bowel: Bowel assessment is limited in the absence of enteric contrast as well as patient motion artifact. Diffuse gaseous distension of small bowel loops. There are areas of small bowel pneumatosis as well as free air in the adjacent mid abdomen, detailed assessment obscured by motion, however for example series 2, image 45. Pneumatosis appears to involve a moderate segment of small bowel. Occasional small air-fluid levels. Patient is post at least partial colectomy with sigmoid colon remaining in situ, and chain sutures noted in the left abdomen. Stool in the distal colon. Vascular/Lymphatic: Advanced aortic and branch atherosclerosis. No  aortic aneurysm. There is no portal venous or mesenteric venous gas. Cannot assess for small-vessel mesenteric gas given motion. No bulky abdominopelvic adenopathy. Reproductive: Unenhanced uterus grossly normal.  No adnexal mass. Other: No significant ascites or free fluid.  Small volume of free air suspected in the mid abdomen adjacent to small bowel pneumatosis, without tracking under the hemidiaphragms. Musculoskeletal: Skin defect consistent sacral decubitus ulcer extends to the sacrum and coccyx. There is no obvious bony resorption or destruction. No focal fluid collection. Laxity of anterior abdominal musculature. Bones are diffusely under mineralized with diffuse degenerative change throughout the spine. Gluteal and paravertebral muscle fatty atrophy. IMPRESSION: 1. Small bowel pneumatosis and free air in the mid abdomen, detailed assessment obscured by motion artifact. This is suspicious for bowel ischemia. No portal venous or mesenteric venous gas. 2. Skin defect consistent with sacral decubitus ulcer extends to the sacrum and coccyx. No obvious bony resorption or destruction. 3. Bilateral nonobstructing renal calculi. Aortic Atherosclerosis (ICD10-I70.0). These results were called by telephone at the time of interpretation on 10/20/2020 at 3:44 pm to Dr Ellender Hose, who verbally acknowledged these results. Electronically Signed   By: Keith Rake M.D.   On: 10/20/2020 15:44   DG Chest Port 1 View  Result Date: 10/20/2020 CLINICAL DATA:  Malaise EXAM: PORTABLE CHEST 1 VIEW COMPARISON:  None. FINDINGS: The heart size and mediastinal contours are within normal limits. Both lungs are clear. The visualized skeletal structures are unremarkable. IMPRESSION: No active disease. Electronically Signed   By: Miachel Roux M.D.   On: 10/20/2020 16:11    Assessment and Plan: This is a 75 y.o. female with dehydration, AKI, and small bowel pneumatosis and free air.  --Discussed with the patient's daughter and with  Dr. Tobie Poet about the patient's status.  Currently, despite of the CT scan findings, she does appear comfortable, is not toxic, and does not have pain on my exam.  However, the CT scan findings are concerning.  Discussed with them though that if we go to the OR for exlap, I would not be able to close her abdomen due to her large ventral hernia, and it would not be reasonable to also pursue a complex ventral hernia repair in this setting.  I would have to leave her abdomen open, likely keep her intubated in the ICU, and I think this would be too much for her at this point.  Her daughter and Dr. Tobie Poet agree that in this scenario, less is more for her.  I would definitely recommend though, to keep her NPO, start IV fluid hydration, and start IV Zosyn for bowel coverage.  If there is clinical deterioration, would call her daughter to discuss, but I do not think we may be in a better scenario to go to the OR either.   --Will continue to follow with you.   Melvyn Neth, MD [facility] Pg:  [phone]

## 2020-10-20 NOTE — ED Notes (Signed)
Due to AMS pt unable to sign emtala waiver

## 2020-10-20 NOTE — ED Triage Notes (Signed)
pt arrives from SNF via their private transport. pt in personal wheelchair. pt hx of dementia, significant sacral wound that was reported as almost healed, foley in place reported to be blocked on friday and changed due to blockage. dacility reported thrush, diarrhea, and decreased PO intake with some increased altered mental status change. BUN 113, and creatinine 3.6 per facility. Pt alert and oriented to self. NAD noted at this time

## 2020-10-21 DIAGNOSIS — I10 Essential (primary) hypertension: Secondary | ICD-10-CM | POA: Diagnosis present

## 2020-10-21 DIAGNOSIS — N179 Acute kidney failure, unspecified: Secondary | ICD-10-CM | POA: Diagnosis present

## 2020-10-21 DIAGNOSIS — K668 Other specified disorders of peritoneum: Secondary | ICD-10-CM | POA: Diagnosis not present

## 2020-10-21 DIAGNOSIS — R6520 Severe sepsis without septic shock: Secondary | ICD-10-CM | POA: Diagnosis present

## 2020-10-21 DIAGNOSIS — F0390 Unspecified dementia without behavioral disturbance: Secondary | ICD-10-CM | POA: Diagnosis not present

## 2020-10-21 DIAGNOSIS — E876 Hypokalemia: Secondary | ICD-10-CM | POA: Diagnosis not present

## 2020-10-21 DIAGNOSIS — R54 Age-related physical debility: Secondary | ICD-10-CM | POA: Diagnosis present

## 2020-10-21 DIAGNOSIS — R799 Abnormal finding of blood chemistry, unspecified: Secondary | ICD-10-CM | POA: Diagnosis not present

## 2020-10-21 DIAGNOSIS — F419 Anxiety disorder, unspecified: Secondary | ICD-10-CM | POA: Diagnosis present

## 2020-10-21 DIAGNOSIS — F0280 Dementia in other diseases classified elsewhere without behavioral disturbance: Secondary | ICD-10-CM | POA: Diagnosis present

## 2020-10-21 DIAGNOSIS — E46 Unspecified protein-calorie malnutrition: Secondary | ICD-10-CM | POA: Diagnosis present

## 2020-10-21 DIAGNOSIS — Z20822 Contact with and (suspected) exposure to covid-19: Secondary | ICD-10-CM | POA: Diagnosis present

## 2020-10-21 DIAGNOSIS — B370 Candidal stomatitis: Secondary | ICD-10-CM | POA: Diagnosis present

## 2020-10-21 DIAGNOSIS — L8990 Pressure ulcer of unspecified site, unspecified stage: Secondary | ICD-10-CM | POA: Diagnosis not present

## 2020-10-21 DIAGNOSIS — G309 Alzheimer's disease, unspecified: Secondary | ICD-10-CM | POA: Diagnosis present

## 2020-10-21 DIAGNOSIS — K6389 Other specified diseases of intestine: Secondary | ICD-10-CM

## 2020-10-21 DIAGNOSIS — K219 Gastro-esophageal reflux disease without esophagitis: Secondary | ICD-10-CM | POA: Diagnosis present

## 2020-10-21 DIAGNOSIS — R933 Abnormal findings on diagnostic imaging of other parts of digestive tract: Secondary | ICD-10-CM | POA: Diagnosis not present

## 2020-10-21 DIAGNOSIS — R4182 Altered mental status, unspecified: Secondary | ICD-10-CM | POA: Diagnosis present

## 2020-10-21 DIAGNOSIS — Z66 Do not resuscitate: Secondary | ICD-10-CM | POA: Diagnosis present

## 2020-10-21 DIAGNOSIS — F32A Depression, unspecified: Secondary | ICD-10-CM | POA: Diagnosis present

## 2020-10-21 DIAGNOSIS — R410 Disorientation, unspecified: Secondary | ICD-10-CM | POA: Diagnosis present

## 2020-10-21 DIAGNOSIS — R935 Abnormal findings on diagnostic imaging of other abdominal regions, including retroperitoneum: Secondary | ICD-10-CM | POA: Diagnosis not present

## 2020-10-21 DIAGNOSIS — L89153 Pressure ulcer of sacral region, stage 3: Secondary | ICD-10-CM | POA: Diagnosis present

## 2020-10-21 DIAGNOSIS — A419 Sepsis, unspecified organism: Secondary | ICD-10-CM | POA: Diagnosis present

## 2020-10-21 DIAGNOSIS — R140 Abdominal distension (gaseous): Secondary | ICD-10-CM | POA: Diagnosis not present

## 2020-10-21 DIAGNOSIS — Z6830 Body mass index (BMI) 30.0-30.9, adult: Secondary | ICD-10-CM | POA: Diagnosis not present

## 2020-10-21 DIAGNOSIS — N390 Urinary tract infection, site not specified: Secondary | ICD-10-CM | POA: Diagnosis present

## 2020-10-21 DIAGNOSIS — S31000A Unspecified open wound of lower back and pelvis without penetration into retroperitoneum, initial encounter: Secondary | ICD-10-CM | POA: Diagnosis not present

## 2020-10-21 DIAGNOSIS — E119 Type 2 diabetes mellitus without complications: Secondary | ICD-10-CM | POA: Diagnosis present

## 2020-10-21 DIAGNOSIS — K2270 Barrett's esophagus without dysplasia: Secondary | ICD-10-CM | POA: Diagnosis present

## 2020-10-21 DIAGNOSIS — E785 Hyperlipidemia, unspecified: Secondary | ICD-10-CM | POA: Diagnosis present

## 2020-10-21 LAB — CBC
HCT: 36.6 % (ref 36.0–46.0)
Hemoglobin: 11.8 g/dL — ABNORMAL LOW (ref 12.0–15.0)
MCH: 28.4 pg (ref 26.0–34.0)
MCHC: 32.2 g/dL (ref 30.0–36.0)
MCV: 88.2 fL (ref 80.0–100.0)
Platelets: 280 10*3/uL (ref 150–400)
RBC: 4.15 MIL/uL (ref 3.87–5.11)
RDW: 17.8 % — ABNORMAL HIGH (ref 11.5–15.5)
WBC: 10 10*3/uL (ref 4.0–10.5)
nRBC: 0 % (ref 0.0–0.2)

## 2020-10-21 LAB — BASIC METABOLIC PANEL
Anion gap: 11 (ref 5–15)
BUN: 112 mg/dL — ABNORMAL HIGH (ref 8–23)
CO2: 14 mmol/L — ABNORMAL LOW (ref 22–32)
Calcium: 9.3 mg/dL (ref 8.9–10.3)
Chloride: 114 mmol/L — ABNORMAL HIGH (ref 98–111)
Creatinine, Ser: 2.97 mg/dL — ABNORMAL HIGH (ref 0.44–1.00)
GFR, Estimated: 16 mL/min — ABNORMAL LOW (ref >60)
Glucose, Bld: 86 mg/dL (ref 70–99)
Potassium: 3.8 mmol/L (ref 3.5–5.1)
Sodium: 139 mmol/L (ref 135–145)

## 2020-10-21 LAB — URINE CULTURE

## 2020-10-21 NOTE — Plan of Care (Signed)
  Problem: Health Behavior/Discharge Planning: Goal: Ability to manage health-related needs will improve Outcome: Progressing   Problem: Activity: Goal: Risk for activity intolerance will decrease Outcome: Progressing   Problem: Coping: Goal: Level of anxiety will decrease Outcome: Progressing   Problem: Elimination: Goal: Will not experience complications related to bowel motility Outcome: Progressing Goal: Will not experience complications related to urinary retention Outcome: Progressing   Problem: Pain Managment: Goal: General experience of comfort will improve Outcome: Progressing   Problem: Safety: Goal: Ability to remain free from injury will improve Outcome: Progressing

## 2020-10-21 NOTE — Progress Notes (Signed)
10/21/2020  Subjective: No acute events.  Patient denies any pain.  Her Cr this morning is improved to 2.97.  WBC also improved to 10, and lactic acid within normal at 1.9.  No bowel function recorded.  Vital signs: Temp:  [96.6 F (35.9 C)-98.1 F (36.7 C)] 97.3 F (36.3 C) (04/23 0802) Pulse Rate:  [65-87] 65 (04/23 0802) Resp:  [15-20] 17 (04/23 0802) BP: (126-147)/(53-75) 127/64 (04/23 0802) SpO2:  [100 %] 100 % (04/23 0802) Weight:  [72.6 kg] 72.6 kg (04/22 1222)   Intake/Output: 04/22 0701 - 04/23 0700 In: -  Out: 950 [Urine:950] Last BM Date:  (pt keeps giving different dates (unknown))  Physical Exam: Constitutional:  No acute distress Abdomen:  Soft, distended, non-tender to palpation.  Patient has large ventral hernia.  Labs:  Recent Labs    10/20/20 1356 10/21/20 0548  WBC 13.4* 10.0  HGB 14.0 11.8*  HCT 41.2 36.6  PLT 392 280   Recent Labs    10/20/20 1356 10/21/20 0548  NA 137 139  K 4.1 3.8  CL 107 114*  CO2 18* 14*  GLUCOSE 107* 86  BUN 122* 112*  CREATININE 3.49* 2.97*  CALCIUM 10.3 9.3   No results for input(s): LABPROT, INR in the last 72 hours.  Imaging: CT ABDOMEN PELVIS WO CONTRAST  Result Date: 10/20/2020 CLINICAL DATA:  Abdominal distension. Nursing home patient with dementia and sacral decubitus wound. EXAM: CT ABDOMEN AND PELVIS WITHOUT CONTRAST TECHNIQUE: Multidetector CT imaging of the abdomen and pelvis was performed following the standard protocol without IV contrast. COMPARISON:  Most recent CT 07/30/2011 FINDINGS: Lower chest: Mild cardiomegaly. Coronary artery calcifications. Atherosclerosis and tortuosity of the included thoracic aorta. Subsegmental atelectasis in the lingula and left lower lobe. No pleural effusion or acute airspace disease. Hepatobiliary: Elongated right lobe of the liver. No evidence of focal liver abnormality on this noncontrast exam. Cholecystectomy. No obvious biliary dilatation, motion obscures detailed  assessment. There is no portal venous gas. Pancreas: Parenchymal atrophy. No ductal dilatation or inflammation. Spleen: Normal in size without focal abnormality. Adrenals/Urinary Tract: No adrenal nodule. 6 mm nonobstructing stone in the lower left kidney. 3 mm nonobstructing stone in the lower right kidney. There is no hydronephrosis. Probable cyst in the upper right kidney, series 2, image 26, obscured by motion. Minor symmetric perinephric edema. No ureteral calculi. Foley catheter decompresses the urinary bladder. Equivocal bladder wall thickening. Small amount of air in the bladder is likely related to catheter. Stomach/Bowel: Bowel assessment is limited in the absence of enteric contrast as well as patient motion artifact. Diffuse gaseous distension of small bowel loops. There are areas of small bowel pneumatosis as well as free air in the adjacent mid abdomen, detailed assessment obscured by motion, however for example series 2, image 45. Pneumatosis appears to involve a moderate segment of small bowel. Occasional small air-fluid levels. Patient is post at least partial colectomy with sigmoid colon remaining in situ, and chain sutures noted in the left abdomen. Stool in the distal colon. Vascular/Lymphatic: Advanced aortic and branch atherosclerosis. No aortic aneurysm. There is no portal venous or mesenteric venous gas. Cannot assess for small-vessel mesenteric gas given motion. No bulky abdominopelvic adenopathy. Reproductive: Unenhanced uterus grossly normal.  No adnexal mass. Other: No significant ascites or free fluid. Small volume of free air suspected in the mid abdomen adjacent to small bowel pneumatosis, without tracking under the hemidiaphragms. Musculoskeletal: Skin defect consistent sacral decubitus ulcer extends to the sacrum and coccyx. There is no  obvious bony resorption or destruction. No focal fluid collection. Laxity of anterior abdominal musculature. Bones are diffusely under mineralized  with diffuse degenerative change throughout the spine. Gluteal and paravertebral muscle fatty atrophy. IMPRESSION: 1. Small bowel pneumatosis and free air in the mid abdomen, detailed assessment obscured by motion artifact. This is suspicious for bowel ischemia. No portal venous or mesenteric venous gas. 2. Skin defect consistent with sacral decubitus ulcer extends to the sacrum and coccyx. No obvious bony resorption or destruction. 3. Bilateral nonobstructing renal calculi. Aortic Atherosclerosis (ICD10-I70.0). These results were called by telephone at the time of interpretation on 10/20/2020 at 3:44 pm to Dr Ellender Hose, who verbally acknowledged these results. Electronically Signed   By: Keith Rake M.D.   On: 10/20/2020 15:44   DG Chest Port 1 View  Result Date: 10/20/2020 CLINICAL DATA:  Malaise EXAM: PORTABLE CHEST 1 VIEW COMPARISON:  None. FINDINGS: The heart size and mediastinal contours are within normal limits. Both lungs are clear. The visualized skeletal structures are unremarkable. IMPRESSION: No active disease. Electronically Signed   By: Miachel Roux M.D.   On: 10/20/2020 16:11    Assessment/Plan: This is a 75 y.o. female with AKI, UTI, distended small bowel and concern for pneumatosis and free air.  --Patient remains clinically stable, denies any abdominal pain.  For now, would continue conservative measures and keep her NPO today, continue IV abx and IV fluids to continue resuscitation.  Discussed yesterday with her PCP Dr. Raynelle Bring, and she also agreed that surgical intervention may be worse off given her frailty and current condition.  Will continue to follow along.   Melvyn Neth, [facility]

## 2020-10-21 NOTE — Progress Notes (Signed)
SLP Cancellation Note  Patient Details Name: Kaitlin Wagner MRN: [id number] DOB: 12-22-1945   Cancelled treatment:       Reason Eval/Treat Not Completed: Patient not medically ready;Medical issues which prohibited therapy (chart reviewed; consulted MD.).  Per MD/Surgery notes, pt has medical history significant for advance dementia, weight lost, Barretts esophagus, sacral decubitus ulcer, and abd. Distention - small bowel pneumatosis and free air in the mid abdomen, suspicious for ischemia. Per Surgery, pt is poor candidate for surgery, given large ventral hernia , monitored with nonsurgical intervention, keep NPO and continue with IV fluids, and IV antibiotics coverage. If no improvement, or patient worsens, then proceed with surgery. ST services will monitor pt's status while admitted for further skilled ST needs. MD agreed.     Orinda Kenner, MS, CCC-SLP Speech Language Pathologist Rehab Services [phone] [facility] 10/21/2020, 2:49 PM

## 2020-10-21 NOTE — Progress Notes (Signed)
PROGRESS NOTE                                                                             PROGRESS NOTE                                                                                                                                                                                                             Patient Demographics:    Kaitlin Wagner, is a 75 y.o. female, DOB - 04/27/1946, RSW:[id number]  Outpatient Primary MD for the patient is [place] date - 10/20/2020    Chief Complaint  Patient presents with  . Altered Mental Status  . Abnormal Lab       Brief Narrative    Kaitlin Wagner is a 75 y.o. female with medical history significant for advance dementia, weight lost, Barretts esophagus, sacral decubitus ulcer, presents to the emergency department for chief concerns of altered mentation, her work-up was significant for tented small bowels, with concern of pneumatosis and free air, she was seen by general surgery, she is poor surgical candidate, current plan to continue with conservative management close monitoring, and plan for surgical intervention if she deteriorates.   Subjective:    Kaitlin Wagner today denies any abdominal pain, nausea or vomiting, she is confused and poor historian at baseline, no significant events as discussed with staff.   Assessment  & Plan :    Principal Problem:   AKI (acute kidney injury) ([place]) Active Problems:   Advanced dementia ([place])   GERD (gastroesophageal reflux disease)   Barrett esophagus   Sacral wound, initial encounter   Pneumatosis intestinalis of small intestine   Pneumoperitoneum   Pressure injury of skin   Sepsis due to small bowel pneumatosis and UTI -  Renal, leukocytosis, hypothermia, with source of urine small bowel pneumatosis -Input greatly appreciated, patient is poor candidate for surgery, given large ventral hernia , now planned with nonsurgical  intervention, keep n.p.o., continue with IV fluids, and  IV antibiotics coverage, if no improvement, or patient worsens, then proceed with surgery. -Follow on urine cultures. -Continue with IV fluids. -Follow-up blood cultures. -Continue with IV Zosyn   Acute kidney injury - suspect secondary to prerenal, in setting of poor p.o. intake in setting of oral thrush - Serum creatinine on presentation is 3.49, eGFR 13 -Per PCP printed notes uploaded into media, previous 3 serum creatinine was 0.73 (08/22/20), 1.01 (05/03/2020), 1.57 (04/28/20) -  Improving, it is 2.9 today, still far from baseline, continue with IV fluids, avoid nephrotoxic medications   Oral thrush -He is n.p.o., continue with IV Diflucan   depression/anxiety- previously on venlafaxine 25 mg twice daily, however this has been discontinued by her PCP  History of hypertension-- no longer requiring antihypertensives, hydralazine 25 mg q8h as needed in place for sbp > 185  History of hyperlipidemia- no longer taking  Presence of indwelling Foley catheter- Per geriatrician note, Foley was placed 10/13/2020 for assistance with wound healing  Calorie and protein malnutrition- Consult to registered dietitian placed  Sacral decubitus ulcer-present on admission, wound consult placed   At risk for aspiration -She is currently n.p.o., will consult SLP once cleared for oral intake by general surgery.  As needed medications: Ondansetron, acetaminophen, hydralazine  Fall precautions, aspiration precautions  Chart reviewed.  Reviewed [facility] [employer].  Patient's geriatrician is Dr. Leward Wagner and she states that she can be reached at [phone] should we have any questions regarding Kaitlin Wagner.      SpO2: 100 %  Recent Labs  Lab 10/20/20 1252 10/20/20 1356 10/20/20 1819 10/20/20 2105 10/21/20 0548  WBC  --  13.4*  --   --  10.0  PLT  --  392  --   --  280  PROCALCITON  --   --  <0.10  --   --    AST  --  16  --   --   --   ALT  --  23  --   --   --   ALKPHOS  --  118  --   --   --   BILITOT  --  1.0  --   --   --   ALBUMIN  --  4.4  --   --   --   LATICACIDVEN  --   --  1.9 1.9  --   SARSCOV2NAA NEGATIVE  --   --   --   --        ABG  No results found for: PHART, PCO2ART, PO2ART, HCO3, TCO2, ACIDBASEDEF, O2SAT        Condition - Extremely Guarded  Family Communication  :  None at bedsdie  Code Status :  DNR  Consults  :  General surgery  Procedures  :  none  Disposition Plan  :    Status is: Observation  The patient will require care spanning > 2 midnights and should be moved to inpatient because: IV treatments appropriate due to intensity of illness or inability to take PO  Dispo: The patient is from: SNF              Anticipated d/c is to: SNF              Patient currently is not medically stable to d/c.   Difficult to place patient No      DVT Prophylaxis  :   Heparin   Lab Results  Component Value Date   PLT 280 10/21/2020  Diet :  Diet Order            Diet NPO time specified  Diet effective now                  Inpatient Medications  Scheduled Meds: . aspirin EC  81 mg Oral Daily  . Chlorhexidine Gluconate Cloth  6 each Topical Daily  . fluticasone  1 spray Each Nare Daily  . heparin  5,000 Units Subcutaneous Q8H   Continuous Infusions: . sodium chloride 250 mL (10/20/20 1819)  . fluconazole (DIFLUCAN) IV 100 mg (10/20/20 2155)  . lactated ringers 125 mL/hr at 10/21/20 0937  . piperacillin-tazobactam (ZOSYN)  IV 2.25 g (10/21/20 0942)   PRN Meds:.sodium chloride, acetaminophen **OR** acetaminophen, hydrALAZINE, ondansetron **OR** ondansetron (ZOFRAN) IV  Antibiotics  :    Anti-infectives (From admission, onward)   Start     Dose/Rate Route Frequency Ordered Stop   10/21/20 0200  piperacillin-tazobactam (ZOSYN) IVPB 2.25 g        2.25 g 100 mL/hr over 30 Minutes Intravenous Every 8 hours 10/20/20 2224     10/20/20  2100  fluconazole (DIFLUCAN) IVPB 100 mg  Status:  Discontinued        100 mg 50 mL/hr over 60 Minutes Intravenous Every 24 hours 10/20/20 2012 10/20/20 2014   10/20/20 2100  fluconazole (DIFLUCAN) IVPB 100 mg        100 mg 50 mL/hr over 60 Minutes Intravenous Every 48 hours 10/20/20 2014     10/20/20 1700  piperacillin-tazobactam (ZOSYN) IVPB 3.375 g  Status:  Discontinued        3.375 g 100 mL/hr over 30 Minutes Intravenous Every 8 hours 10/20/20 1649 10/20/20 2224   10/20/20 1545  cefTRIAXone (ROCEPHIN) 1 g in sodium chloride 0.9 % 100 mL IVPB  Status:  Discontinued        1 g 200 mL/hr over 30 Minutes Intravenous Every 24 hours 10/20/20 1544 10/20/20 1629        Shaheim Mahar M.D on 10/21/2020 at 12:16 PM  To page go to [email or web address]  [facility] -  Office  [phone]      Objective:   Vitals:   10/20/20 2051 10/21/20 0057 10/21/20 0443 10/21/20 0802  BP: (!) 147/69 140/75 (!) 141/72 127/64  Pulse: 74 72 68 65  Resp: 16 18 15 17   Temp: 98.1 F (36.7 C) 98 F (36.7 C) (!) 97.5 F (36.4 C) (!) 97.3 F (36.3 C)  TempSrc: Oral Oral Oral Oral  SpO2: 100% 100% 100% 100%  Weight:      Height:        Wt Readings from Last 3 Encounters:  10/20/20 72.6 kg  05/12/18 93.4 kg  05/12/15 95.3 kg     Intake/Output Summary (Last 24 hours) at 10/21/2020 1216 Last data filed at 10/21/2020 0936 Gross per 24 hour  Intake 60.61 ml  Output 950 ml  Net -889.39 ml     Physical Exam  Awake Alert, frail, demented, impaired cognition and insight,NAD Symmetrical Chest wall movement, Good air movement bilaterally, CTAB RRR,No Gallops,Rubs or new Murmurs, No Parasternal Heave Large ventral hernia, soft, distended,  No Cyanosis, Clubbing or edema, No new Rash or bruise      Data Review:    CBC Recent Labs  Lab 10/20/20 1356 10/21/20 0548  WBC 13.4* 10.0  HGB 14.0 11.8*  HCT 41.2 36.6  PLT 392 280  MCV 83.1 88.2  MCH 28.2 28.4  MCHC 34.0 32.2  RDW 17.0*  17.8*    Recent Labs  Lab 10/20/20 1356 10/20/20 1819 10/20/20 2105 10/21/20 0548  NA 137  --   --  139  K 4.1  --   --  3.8  CL 107  --   --  114*  CO2 18*  --   --  14*  GLUCOSE 107*  --   --  86  BUN 122*  --   --  112*  CREATININE 3.49*  --   --  2.97*  CALCIUM 10.3  --   --  9.3  AST 16  --   --   --   ALT 23  --   --   --   ALKPHOS 118  --   --   --   BILITOT 1.0  --   --   --   ALBUMIN 4.4  --   --   --   PROCALCITON  --  <0.10  --   --   LATICACIDVEN  --  1.9 1.9  --   TSH  --  0.463  --   --     ------------------------------------------------------------------------------------------------------------------ No results for input(s): CHOL, HDL, LDLCALC, TRIG, CHOLHDL, LDLDIRECT in the last 72 hours.  No results found for: HGBA1C ------------------------------------------------------------------------------------------------------------------ Recent Labs    10/20/20 1819  TSH 0.463    Cardiac Enzymes No results for input(s): CKMB, TROPONINI, MYOGLOBIN in the last 168 hours.  Invalid input(s): CK ------------------------------------------------------------------------------------------------------------------ No results found for: BNP  Micro Results Recent Results (from the past 240 hour(s))  Resp Panel by RT-PCR (Flu A&B, Covid) Nasopharyngeal Swab     Status: None   Collection Time: 10/20/20 12:52 PM   Specimen: Nasopharyngeal Swab; Nasopharyngeal(NP) swabs in vial transport medium  Result Value Ref Range Status   SARS Coronavirus 2 by RT PCR NEGATIVE NEGATIVE Final    Comment: (NOTE) SARS-CoV-2 target nucleic acids are NOT DETECTED.  The SARS-CoV-2 RNA is generally detectable in upper respiratory specimens during the acute phase of infection. The lowest concentration of SARS-CoV-2 viral copies this assay can detect is 138 copies/mL. A negative result does not preclude SARS-Cov-2 infection and should not be used as the sole basis for treatment  or other patient management decisions. A negative result may occur with  improper specimen collection/handling, submission of specimen other than nasopharyngeal swab, presence of viral mutation(s) within the areas targeted by this assay, and inadequate number of viral copies(<138 copies/mL). A negative result must be combined with clinical observations, patient history, and epidemiological information. The expected result is Negative.  Fact Sheet for Patients:  EntrepreneurPulse.com.au  Fact Sheet for Healthcare Providers:  IncredibleEmployment.be  This test is no t yet approved or cleared by the [place] FDA and  has been authorized for detection and/or diagnosis of SARS-CoV-2 by FDA under an Emergency Use Authorization (EUA). This EUA will remain  in effect (meaning this test can be used) for the duration of the COVID-19 declaration under Section 564(b)(1) of the Act, 21 U.S.C.section 360bbb-3(b)(1), unless the authorization is terminated  or revoked sooner.       Influenza A by PCR NEGATIVE NEGATIVE Final   Influenza B by PCR NEGATIVE NEGATIVE Final    Comment: (NOTE) The Xpert Xpress SARS-CoV-2/FLU/RSV plus assay is intended as an aid in the diagnosis of influenza from Nasopharyngeal swab specimens and should not be used as a sole basis for treatment. Nasal washings and aspirates are unacceptable for Xpert Xpress SARS-CoV-2/FLU/RSV testing.  Fact Sheet for Patients:  EntrepreneurPulse.com.au  Fact Sheet for Healthcare Providers: IncredibleEmployment.be  This test is not yet approved or cleared by the [place] FDA and has been authorized for detection and/or diagnosis of SARS-CoV-2 by FDA under an Emergency Use Authorization (EUA). This EUA will remain in effect (meaning this test can be used) for the duration of the COVID-19 declaration under Section 564(b)(1) of the Act, 21 U.S.C. section  360bbb-3(b)(1), unless the authorization is terminated or revoked.  Performed at [facility], [address] [place], [place] [address]     Radiology Reports CT ABDOMEN PELVIS WO CONTRAST  Result Date: 10/20/2020 CLINICAL DATA:  Abdominal distension. Nursing home patient with dementia and sacral decubitus wound. EXAM: CT ABDOMEN AND PELVIS WITHOUT CONTRAST TECHNIQUE: Multidetector CT imaging of the abdomen and pelvis was performed following the standard protocol without IV contrast. COMPARISON:  Most recent CT 07/30/2011 FINDINGS: Lower chest: Mild cardiomegaly. Coronary artery calcifications. Atherosclerosis and tortuosity of the included thoracic aorta. Subsegmental atelectasis in the lingula and left lower lobe. No pleural effusion or acute airspace disease. Hepatobiliary: Elongated right lobe of the liver. No evidence of focal liver abnormality on this noncontrast exam. Cholecystectomy. No obvious biliary dilatation, motion obscures detailed assessment. There is no portal venous gas. Pancreas: Parenchymal atrophy. No ductal dilatation or inflammation. Spleen: Normal in size without focal abnormality. Adrenals/Urinary Tract: No adrenal nodule. 6 mm nonobstructing stone in the lower left kidney. 3 mm nonobstructing stone in the lower right kidney. There is no hydronephrosis. Probable cyst in the upper right kidney, series 2, image 26, obscured by motion. Minor symmetric perinephric edema. No ureteral calculi. Foley catheter decompresses the urinary bladder. Equivocal bladder wall thickening. Small amount of air in the bladder is likely related to catheter. Stomach/Bowel: Bowel assessment is limited in the absence of enteric contrast as well as patient motion artifact. Diffuse gaseous distension of small bowel loops. There are areas of small bowel pneumatosis as well as free air in the adjacent mid abdomen, detailed assessment obscured by motion, however for example series 2, image 45.  Pneumatosis appears to involve a moderate segment of small bowel. Occasional small air-fluid levels. Patient is post at least partial colectomy with sigmoid colon remaining in situ, and chain sutures noted in the left abdomen. Stool in the distal colon. Vascular/Lymphatic: Advanced aortic and branch atherosclerosis. No aortic aneurysm. There is no portal venous or mesenteric venous gas. Cannot assess for small-vessel mesenteric gas given motion. No bulky abdominopelvic adenopathy. Reproductive: Unenhanced uterus grossly normal.  No adnexal mass. Other: No significant ascites or free fluid. Small volume of free air suspected in the mid abdomen adjacent to small bowel pneumatosis, without tracking under the hemidiaphragms. Musculoskeletal: Skin defect consistent sacral decubitus ulcer extends to the sacrum and coccyx. There is no obvious bony resorption or destruction. No focal fluid collection. Laxity of anterior abdominal musculature. Bones are diffusely under mineralized with diffuse degenerative change throughout the spine. Gluteal and paravertebral muscle fatty atrophy. IMPRESSION: 1. Small bowel pneumatosis and free air in the mid abdomen, detailed assessment obscured by motion artifact. This is suspicious for bowel ischemia. No portal venous or mesenteric venous gas. 2. Skin defect consistent with sacral decubitus ulcer extends to the sacrum and coccyx. No obvious bony resorption or destruction. 3. Bilateral nonobstructing renal calculi. Aortic Atherosclerosis (ICD10-I70.0). These results were called by telephone at the time of interpretation on 10/20/2020 at 3:44 pm to Dr Ellender Hose, who verbally acknowledged these results. Electronically Signed   By: Aurther Loft.D.  On: 10/20/2020 15:44   DG Chest Port 1 View  Result Date: 10/20/2020 CLINICAL DATA:  [place]alaise EXAM: PORTABLE CHEST 1 VIEW COMPARISON:  None. FINDINGS: The heart size and mediastinal contours are within normal limits. Both lungs are clear.  The visualized skeletal structures are unremarkable. IMPRESSION: No active disease. Electronically Signed   By: Miachel Roux M.D.   On: 10/20/2020 16:11

## 2020-10-22 ENCOUNTER — Inpatient Hospital Stay: Payer: Medicare (Managed Care)

## 2020-10-22 DIAGNOSIS — R140 Abdominal distension (gaseous): Secondary | ICD-10-CM

## 2020-10-22 DIAGNOSIS — R799 Abnormal finding of blood chemistry, unspecified: Secondary | ICD-10-CM

## 2020-10-22 LAB — BASIC METABOLIC PANEL
Anion gap: 8 (ref 5–15)
BUN: 88 mg/dL — ABNORMAL HIGH (ref 8–23)
CO2: 19 mmol/L — ABNORMAL LOW (ref 22–32)
Calcium: 9 mg/dL (ref 8.9–10.3)
Chloride: 116 mmol/L — ABNORMAL HIGH (ref 98–111)
Creatinine, Ser: 2.63 mg/dL — ABNORMAL HIGH (ref 0.44–1.00)
GFR, Estimated: 18 mL/min — ABNORMAL LOW (ref >60)
Glucose, Bld: 80 mg/dL (ref 70–99)
Potassium: 3.2 mmol/L — ABNORMAL LOW (ref 3.5–5.1)
Sodium: 143 mmol/L (ref 135–145)

## 2020-10-22 LAB — CBC
HCT: 31.5 % — ABNORMAL LOW (ref 36.0–46.0)
Hemoglobin: 10.9 g/dL — ABNORMAL LOW (ref 12.0–15.0)
MCH: 28.4 pg (ref 26.0–34.0)
MCHC: 34.6 g/dL (ref 30.0–36.0)
MCV: 82 fL (ref 80.0–100.0)
Platelets: 300 10*3/uL (ref 150–400)
RBC: 3.84 MIL/uL — ABNORMAL LOW (ref 3.87–5.11)
RDW: 17.1 % — ABNORMAL HIGH (ref 11.5–15.5)
WBC: 7.2 10*3/uL (ref 4.0–10.5)
nRBC: 0 % (ref 0.0–0.2)

## 2020-10-22 LAB — GLUCOSE, CAPILLARY: Glucose-Capillary: 79 mg/dL (ref 70–99)

## 2020-10-22 LAB — MAGNESIUM: Magnesium: 2.2 mg/dL (ref 1.7–2.4)

## 2020-10-22 LAB — PHOSPHORUS: Phosphorus: 3.4 mg/dL (ref 2.5–4.6)

## 2020-10-22 MED ORDER — FLUCONAZOLE 100MG IVPB
50.0000 mg | INTRAVENOUS | Status: DC
  Administered 2020-10-22 – 2020-10-23 (×2): 50 mg via INTRAVENOUS
  Filled 2020-10-22 (×2): qty 25

## 2020-10-22 MED ORDER — POTASSIUM CHLORIDE 10 MEQ/100ML IV SOLN
10.0000 meq | INTRAVENOUS | Status: AC
  Administered 2020-10-22 (×4): 10 meq via INTRAVENOUS
  Filled 2020-10-22: qty 100

## 2020-10-22 NOTE — Progress Notes (Signed)
PHARMACY NOTE:  ANTIMICROBIAL RENAL DOSAGE ADJUSTMENT  Current antimicrobial regimen includes a mismatch between antimicrobial dosage and estimated renal function.  As per policy approved by the Pharmacy & Therapeutics and Medical Executive Committees, the antimicrobial dosage will be adjusted accordingly.  Current antimicrobial dosage:  fluconazole 100 mg q48h  Indication: oral thrush  Renal Function:  Estimated Creatinine Clearance: 16.8 mL/min (A) (by C-G formula based on SCr of 2.63 mg/dL (H)).     Antimicrobial dosage has been changed to:  fluconazole 50 mg q24h    Thank you for allowing pharmacy to be a part of this patient's care.  Dorena Bodo, PharmD 10/22/2020 12:07 PM

## 2020-10-22 NOTE — Progress Notes (Addendum)
Addendum: Abd xray personally viewed.  The patient still has distended loops of small bowel, though diffusely distended.  There appears to be still some air between bowel loops, but there's no free air under the diaphragm noticeable.  This could potentially be an ileus picture given her initial presentation and her having a UTI.  Will try with enema this afternoon.  Keep NPO and hydration for now otherwise.  Olean Ree, MD     10/22/2020  Subjective: No acute events per RN report.  No bowel function noted since admission.  Patient has dementia, but when asked, she denies any bowel movement.  Also denies any abdominal pain, nausea, or vomiting.  WBC remains normal, and renal function slowly improving.  Vital signs: Temp:  [97.5 F (36.4 C)-98.1 F (36.7 C)] 98.1 F (36.7 C) (04/24 0755) Pulse Rate:  [73-81] 79 (04/24 0755) Resp:  [11-19] 17 (04/24 0755) BP: (128-143)/(60-75) 143/60 (04/24 0755) SpO2:  [100 %] 100 % (04/24 0755)   Intake/Output: 04/23 0701 - 04/24 0700 In: 1935.5 [I.V.:1885.5; IV Piggyback:50] Out: 4098 [Urine:1550] Last BM Date:  (unknown)  Physical Exam: Constitutional: No acute distress Abdomen:  Soft, less distended compared to yesterday, non-tender to palpation.  Patient has known large ventral hernia.  Labs:  Recent Labs    10/21/20 0548 10/22/20 0432  WBC 10.0 7.2  HGB 11.8* 10.9*  HCT 36.6 31.5*  PLT 280 300   Recent Labs    10/21/20 0548 10/22/20 0432  NA 139 143  K 3.8 3.2*  CL 114* 116*  CO2 14* 19*  GLUCOSE 86 80  BUN 112* 88*  CREATININE 2.97* 2.63*  CALCIUM 9.3 9.0   No results for input(s): LABPROT, INR in the last 72 hours.  Imaging: No results found.  Assessment/Plan: This is a 75 y.o. female with concern for pneumatosis, pneumoperitoneum, and small bowel distention.  --Patient has continued to be stable, without signs of peritonitis or sepsis.  Her abdomen is less distended this morning, and again there's no pain on  exam.  Will obtain abdominal xrays this morning to evaluate further and see if it would be feasible to start her on clear liquids.  If there is concern, may need to repeat CT scan today vs tomorrow. --Patient overall is a poor surgical candidate. --Will continue to follow.   Melvyn Neth, [facility]

## 2020-10-22 NOTE — Plan of Care (Addendum)
Pt alert to self. Non compliant with turns at times.  Problem: Skin Integrity: Goal: Risk for impaired skin integrity will decrease Outcome: Not Progressing  Awaiting wound care to assess and place orders. MD aware.  Problem: Education: Goal: Knowledge of General Education information will improve Description: Including pain rating scale, medication(s)/side effects and non-pharmacologic comfort measures Outcome: Progressing   Problem: Health Behavior/Discharge Planning: Goal: Ability to manage health-related needs will improve Outcome: Progressing   Problem: Clinical Measurements: Goal: Ability to maintain clinical measurements within normal limits will improve Outcome: Progressing Goal: Will remain free from infection Outcome: Progressing Goal: Diagnostic test results will improve Outcome: Progressing Goal: Respiratory complications will improve Outcome: Progressing Goal: Cardiovascular complication will be avoided Outcome: Progressing   Problem: Activity: Goal: Risk for activity intolerance will decrease Outcome: Progressing   Problem: Nutrition: Goal: Adequate nutrition will be maintained Outcome: Progressing   Problem: Coping: Goal: Level of anxiety will decrease Outcome: Progressing   Problem: Elimination: Goal: Will not experience complications related to bowel motility Outcome: Progressing Goal: Will not experience complications related to urinary retention Outcome: Progressing   Problem: Pain Managment: Goal: General experience of comfort will improve Outcome: Progressing   Problem: Safety: Goal: Ability to remain free from injury will improve Outcome: Progressing

## 2020-10-22 NOTE — Progress Notes (Signed)
Initial Nutrition Assessment  DOCUMENTATION CODES:   Not applicable  INTERVENTION:   RD will monitor for diet advancement vs the need for nutrition support  Pt at high refeed risk; recommend monitor potassium, magnesium and phosphorus labs daily until stable  NUTRITION DIAGNOSIS:   Inadequate oral intake related to acute illness as evidenced by NPO status.  GOAL:   Patient will meet greater than or equal to 90% of their needs  MONITOR:   Diet advancement,Labs,Weight trends,Skin,I & O's  REASON FOR ASSESSMENT:   Consult Assessment of nutrition requirement/status  ASSESSMENT:   75 y.o. female with medical history significant for advanced dementia, Barretts esophagus, ventral hernia, sacral decubitus ulcer, GERD, HTN and DM who presents to the emergency department for chief concerns of altered mentation and concern for pneumatosis, pneumoperitoneum and small bowel distention.  RD working remotely.  Unable to speak with patient r/t dementia. Per chart review, pt with poor appetite and oral intake since 4/16 as reported by her living facility. Pt with abdominal distension on admission. Pt found to have pneumoperitoneum. Pt with large ventral hernia and is a poor surgical candidate so plan is for medical management at this time. Pt has remained NPO since admit. Pt is noted to have oral thrush. RD will monitor for diet advancement vs the need for nutritional support. Pt is at high refeed risk. There is no recent documented weight history to determine if any significant recent weight changes.   Medications reviewed and include: aspirin, heparin, difulcan, LRS @100ml /hr, zosyn   Labs reviewed: K 3.2(L), BUN 88(H), creat 2.63(H), P 3.4 wnl, Mg 2.2 wnl Hgb 10.9(L), Hct 31.5(L)   NUTRITION - FOCUSED PHYSICAL EXAM: Unable to perform at this time   Diet Order:   Diet Order            Diet NPO time specified  Diet effective now                EDUCATION NEEDS:   No education  needs have been identified at this time  Skin:  Skin Assessment: Reviewed RN Assessment (Stage III sacrum)  Last BM:  4/24- type 6  Height:   Ht Readings from Last 1 Encounters:  10/20/20 5\' 1"  (1.549 m)    Weight:   Wt Readings from Last 1 Encounters:  10/20/20 72.6 kg    Ideal Body Weight:  47.7 kg  BMI:  Body mass index is 30.23 kg/m.  Estimated Nutritional Needs:   Kcal:  1500-1700kcal/day  Protein:  75-85g/day  Fluid:  1.4L/day  Koleen Distance MS, RD, LDN Please refer to [facility] for RD and/or RD on-call/weekend/after hours pager

## 2020-10-22 NOTE — Progress Notes (Addendum)
PROGRESS NOTE                                                                             PROGRESS NOTE                                                                                                                                                                                                             Patient Demographics:    Kaitlin Wagner, is a 75 y.o. female, DOB - [birth year], ZOX:[id number]  Outpatient Primary MD for the patient is [facility] date - 10/20/2020    Chief Complaint  Patient presents with  . Altered Mental Status  . Abnormal Lab       Brief Narrative    Kaitlin Wagner is a 75 y.o. female with medical history significant for advance dementia, weight lost, Barretts esophagus, sacral decubitus ulcer, presents to the emergency department for chief concerns of altered mentation, her work-up was significant for tented small bowels, with concern of pneumatosis and free air, she was seen by general surgery, she is poor surgical candidate, current plan to continue with conservative management close monitoring, and plan for surgical intervention if she deteriorates.   Subjective:    Kaitlin Wagner today patient herself denies any complaints, no significant events overnight as discussed with staff .    Assessment  & Plan :    Principal Problem:   AKI (acute kidney injury) ([place]) Active Problems:   Advanced dementia ([place])   GERD (gastroesophageal reflux disease)   Barrett esophagus   Sacral wound, initial encounter   Pneumatosis intestinalis of small intestine   Pneumoperitoneum   Pressure injury of skin   Pneumatosis of intestines   Sepsis due to small bowel pneumatosis and UTI -  Renal, leukocytosis, hypothermia, with source of urine small bowel pneumatosis -general surgery Input greatly appreciated, patient is poor candidate for surgery, given large ventral hernia , she is a poor surgical  candidate, for now continue with nonsurgical interventions ,  and to repeat imaging today for further evaluation . -Keep her n.p.o., continue with IV fluids, continue with IV Zosyn . -May need to consider TPN in next 24 to 48 hours if she remains n.p.o.    Acute kidney injury - suspect secondary to prerenal, in setting of poor p.o. intake in setting of oral thrush - Serum creatinine on presentation is 3.49, eGFR 13 -Per PCP printed notes uploaded into media, previous 3 serum creatinine was 0.73 (08/22/20), 1.01 (05/03/2020), 1.57 (04/28/20) -  Continue continue to improve gradually, this morning 0.6, continue gentle hydration and avoid nephrotoxic medications   Oral thrush -He is n.p.o., continue with IV Diflucan   depression/anxiety- previously on venlafaxine 25 mg twice daily, however this has been discontinued by her PCP  History of hypertension-- no longer requiring antihypertensives, hydralazine 25 mg q8h as needed in place for sbp > 185  History of hyperlipidemia- no longer taking  Hypokalemia - With potassium  3.2 today, repleted   Presence of indwelling Foley catheter- Per geriatrician note, Foley was placed 10/13/2020 for assistance with wound healing  Calorie and protein malnutrition- Consult to registered dietitian placed  Sacral decubitus ulcer-present on admission, wound consult placed   At risk for aspiration -She is currently n.p.o., will consult SLP once cleared for oral intake by general surgery.  As needed medications: Ondansetron, acetaminophen, hydralazine  Fall precautions, aspiration precautions  Chart reviewed.  Reviewed [facility] [employer].  Patient's geriatrician is Dr. Leward Quan and she states that she can be reached at [phone] should we have any questions regarding [name].      SpO2: 100 %  Recent Labs  Lab 10/20/20 1252 10/20/20 1356 10/20/20 1819 10/20/20 2105 10/21/20 0548 10/22/20 0432  WBC  --  13.4*  --    --  10.0 7.2  PLT  --  392  --   --  280 300  PROCALCITON  --   --  <0.10  --   --   --   AST  --  16  --   --   --   --   ALT  --  23  --   --   --   --   ALKPHOS  --  118  --   --   --   --   BILITOT  --  1.0  --   --   --   --   ALBUMIN  --  4.4  --   --   --   --   LATICACIDVEN  --   --  1.9 1.9  --   --   SARSCOV2NAA NEGATIVE  --   --   --   --   --        ABG  No results found for: PHART, PCO2ART, PO2ART, HCO3, TCO2, ACIDBASEDEF, O2SAT        Condition - Extremely Guarded  Family Communication  :  D/W daughter by phone  Code Status :  DNR  Consults  :  General surgery  Procedures  :  none  Disposition Plan  :    Status is: Observation  The patient will require care spanning > 2 midnights and should be moved to inpatient because: IV treatments appropriate due to intensity of illness or inability to take PO  Dispo: The patient is from: SNF              Anticipated d/c is to: SNF  Patient currently is not medically stable to d/c.   Difficult to place patient No      DVT Prophylaxis  :   Heparin   Lab Results  Component Value Date   PLT 300 10/22/2020    Diet :  Diet Order            Diet NPO time specified  Diet effective now                  Inpatient Medications  Scheduled Meds: . aspirin EC  81 mg Oral Daily  . Chlorhexidine Gluconate Cloth  6 each Topical Daily  . fluticasone  1 spray Each Nare Daily  . heparin  5,000 Units Subcutaneous Q8H   Continuous Infusions: . sodium chloride 250 mL (10/20/20 1819)  . fluconazole (DIFLUCAN) IV 100 mg (10/20/20 2155)  . lactated ringers 100 mL/hr at 10/22/20 0325  . piperacillin-tazobactam (ZOSYN)  IV 2.25 g (10/22/20 0823)  . potassium chloride 10 mEq (10/22/20 1036)   PRN Meds:.sodium chloride, acetaminophen **OR** acetaminophen, hydrALAZINE, ondansetron **OR** ondansetron (ZOFRAN) IV  Antibiotics  :    Anti-infectives (From admission, onward)   Start     Dose/Rate Route  Frequency Ordered Stop   10/21/20 0200  piperacillin-tazobactam (ZOSYN) IVPB 2.25 g        2.25 g 100 mL/hr over 30 Minutes Intravenous Every 8 hours 10/20/20 2224     10/20/20 2100  fluconazole (DIFLUCAN) IVPB 100 mg  Status:  Discontinued        100 mg 50 mL/hr over 60 Minutes Intravenous Every 24 hours 10/20/20 2012 10/20/20 2014   10/20/20 2100  fluconazole (DIFLUCAN) IVPB 100 mg        100 mg 50 mL/hr over 60 Minutes Intravenous Every 48 hours 10/20/20 2014     10/20/20 1700  piperacillin-tazobactam (ZOSYN) IVPB 3.375 g  Status:  Discontinued        3.375 g 100 mL/hr over 30 Minutes Intravenous Every 8 hours 10/20/20 1649 10/20/20 2224   10/20/20 1545  cefTRIAXone (ROCEPHIN) 1 g in sodium chloride 0.9 % 100 mL IVPB  Status:  Discontinued        1 g 200 mL/hr over 30 Minutes Intravenous Every 24 hours 10/20/20 1544 10/20/20 1629        Spyridon Hornstein M.D on 10/22/2020 at 11:30 AM  To page go to [email or web address]  [facility] -  Office  [phone]      Objective:   Vitals:   10/21/20 2029 10/22/20 0047 10/22/20 0434 10/22/20 0755  BP: 138/75 129/73 128/62 (!) 143/60  Pulse: 76 81 78 79  Resp: 18 19 16 17   Temp: 97.7 F (36.5 C) (!) 97.5 F (36.4 C) 98.1 F (36.7 C) 98.1 F (36.7 C)  TempSrc: Oral Oral Oral   SpO2: 100% 100% 100% 100%  Weight:      Height:        Wt Readings from Last 3 Encounters:  10/20/20 72.6 kg  05/12/18 93.4 kg  05/12/15 95.3 kg     Intake/Output Summary (Last 24 hours) at 10/22/2020 1130 Last data filed at 10/22/2020 1035 Gross per 24 hour  Intake 2640.87 ml  Output 1550 ml  Net 1090.87 ml     Physical Exam  Awake Alert, pleasant, demented at baseline, frail, answering some questions appropriately Symmetrical Chest wall movement, Good air movement bilaterally, CTAB RRR,No Gallops,Rubs or new Murmurs, No Parasternal Heave Large ventral hernia, abdomen is soft, less distended today. No Cyanosis, Clubbing  or edema, No  new Rash or bruise       Data Review:    CBC Recent Labs  Lab 10/20/20 1356 10/21/20 0548 10/22/20 0432  WBC 13.4* 10.0 7.2  HGB 14.0 11.8* 10.9*  HCT 41.2 36.6 31.5*  PLT 392 280 300  MCV 83.1 88.2 82.0  MCH 28.2 28.4 28.4  MCHC 34.0 32.2 34.6  RDW 17.0* 17.8* 17.1*    Recent Labs  Lab 10/20/20 1356 10/20/20 1819 10/20/20 2105 10/21/20 0548 10/22/20 0432  NA 137  --   --  139 143  K 4.1  --   --  3.8 3.2*  CL 107  --   --  114* 116*  CO2 18*  --   --  14* 19*  GLUCOSE 107*  --   --  86 80  BUN 122*  --   --  112* 88*  CREATININE 3.49*  --   --  2.97* 2.63*  CALCIUM 10.3  --   --  9.3 9.0  AST 16  --   --   --   --   ALT 23  --   --   --   --   ALKPHOS 118  --   --   --   --   BILITOT 1.0  --   --   --   --   ALBUMIN 4.4  --   --   --   --   MG  --   --   --   --  2.2  PROCALCITON  --  <0.10  --   --   --   LATICACIDVEN  --  1.9 1.9  --   --   TSH  --  0.463  --   --   --     ------------------------------------------------------------------------------------------------------------------ No results for input(s): CHOL, HDL, LDLCALC, TRIG, CHOLHDL, LDLDIRECT in the last 72 hours.  No results found for: HGBA1C ------------------------------------------------------------------------------------------------------------------ Recent Labs    10/20/20 1819  TSH 0.463    Cardiac Enzymes No results for input(s): CKMB, TROPONINI, MYOGLOBIN in the last 168 hours.  Invalid input(s): CK ------------------------------------------------------------------------------------------------------------------ No results found for: BNP  Micro Results Recent Results (from the past 240 hour(s))  Resp Panel by RT-PCR (Flu A&B, Covid) Nasopharyngeal Swab     Status: None   Collection Time: 10/20/20 12:52 PM   Specimen: Nasopharyngeal Swab; Nasopharyngeal(NP) swabs in vial transport medium  Result Value Ref Range Status   SARS Coronavirus 2 by RT PCR NEGATIVE NEGATIVE  Final    Comment: (NOTE) SARS-CoV-2 target nucleic acids are NOT DETECTED.  The SARS-CoV-2 RNA is generally detectable in upper respiratory specimens during the acute phase of infection. The lowest concentration of SARS-CoV-2 viral copies this assay can detect is 138 copies/mL. A negative result does not preclude SARS-Cov-2 infection and should not be used as the sole basis for treatment or other patient management decisions. A negative result may occur with  improper specimen collection/handling, submission of specimen other than nasopharyngeal swab, presence of viral mutation(s) within the areas targeted by this assay, and inadequate number of viral copies(<138 copies/mL). A negative result must be combined with clinical observations, patient history, and epidemiological information. The expected result is Negative.  Fact Sheet for Patients:  EntrepreneurPulse.com.au  Fact Sheet for Healthcare Providers:  IncredibleEmployment.be  This test is no t yet approved or cleared by the [place] FDA and  has been authorized for detection and/or diagnosis of SARS-CoV-2 by FDA under an Emergency  Use Authorization (EUA). This EUA will remain  in effect (meaning this test can be used) for the duration of the COVID-19 declaration under Section 564(b)(1) of the Act, 21 U.S.C.section 360bbb-3(b)(1), unless the authorization is terminated  or revoked sooner.       Influenza A by PCR NEGATIVE NEGATIVE Final   Influenza B by PCR NEGATIVE NEGATIVE Final    Comment: (NOTE) The Xpert Xpress SARS-CoV-2/FLU/RSV plus assay is intended as an aid in the diagnosis of influenza from Nasopharyngeal swab specimens and should not be used as a sole basis for treatment. Nasal washings and aspirates are unacceptable for Xpert Xpress SARS-CoV-2/FLU/RSV testing.  Fact Sheet for Patients: EntrepreneurPulse.com.au  Fact Sheet for Healthcare  Providers: IncredibleEmployment.be  This test is not yet approved or cleared by the [place] FDA and has been authorized for detection and/or diagnosis of SARS-CoV-2 by FDA under an Emergency Use Authorization (EUA). This EUA will remain in effect (meaning this test can be used) for the duration of the COVID-19 declaration under Section 564(b)(1) of the Act, 21 U.S.C. section 360bbb-3(b)(1), unless the authorization is terminated or revoked.  Performed at [facility], [address], [place], [place] [address]   Urine culture     Status: Abnormal   Collection Time: 10/20/20 12:52 PM   Specimen: Urine, Random  Result Value Ref Range Status   Specimen Description   Final    URINE, RANDOM Performed at [facility], [address], [place], [place] [address]    Special Requests   Final    NONE Performed at [facility], [address] [place],  [address]    Culture MULTIPLE SPECIES PRESENT, SUGGEST RECOLLECTION (A)  Final   Report Status 10/21/2020 FINAL  Final    Radiology Reports CT ABDOMEN PELVIS WO CONTRAST  Result Date: 10/20/2020 CLINICAL DATA:  Abdominal distension. Nursing home patient with dementia and sacral decubitus wound. EXAM: CT ABDOMEN AND PELVIS WITHOUT CONTRAST TECHNIQUE: Multidetector CT imaging of the abdomen and pelvis was performed following the standard protocol without IV contrast. COMPARISON:  Most recent CT 07/30/2011 FINDINGS: Lower chest: Mild cardiomegaly. Coronary artery calcifications. Atherosclerosis and tortuosity of the included thoracic aorta. Subsegmental atelectasis in the lingula and left lower lobe. No pleural effusion or acute airspace disease. Hepatobiliary: Elongated right lobe of the liver. No evidence of focal liver abnormality on this noncontrast exam. Cholecystectomy. No obvious biliary dilatation, motion obscures detailed assessment. There is no portal venous gas. Pancreas:  Parenchymal atrophy. No ductal dilatation or inflammation. Spleen: Normal in size without focal abnormality. Adrenals/Urinary Tract: No adrenal nodule. 6 mm nonobstructing stone in the lower left kidney. 3 mm nonobstructing stone in the lower right kidney. There is no hydronephrosis. Probable cyst in the upper right kidney, series 2, image 26, obscured by motion. Minor symmetric perinephric edema. No ureteral calculi. Foley catheter decompresses the urinary bladder. Equivocal bladder wall thickening. Small amount of air in the bladder is likely related to catheter. Stomach/Bowel: Bowel assessment is limited in the absence of enteric contrast as well as patient motion artifact. Diffuse gaseous distension of small bowel loops. There are areas of small bowel pneumatosis as well as free air in the adjacent mid abdomen, detailed assessment obscured by motion, however for example series 2, image 45. Pneumatosis appears to involve a moderate segment of small bowel. Occasional small air-fluid levels. Patient is post at least partial colectomy with sigmoid colon remaining in situ, and chain sutures noted in the left abdomen. Stool in  the distal colon. Vascular/Lymphatic: Advanced aortic and branch atherosclerosis. No aortic aneurysm. There is no portal venous or mesenteric venous gas. Cannot assess for small-vessel mesenteric gas given motion. No bulky abdominopelvic adenopathy. Reproductive: Unenhanced uterus grossly normal.  No adnexal mass. Other: No significant ascites or free fluid. Small volume of free air suspected in the mid abdomen adjacent to small bowel pneumatosis, without tracking under the hemidiaphragms. Musculoskeletal: Skin defect consistent sacral decubitus ulcer extends to the sacrum and coccyx. There is no obvious bony resorption or destruction. No focal fluid collection. Laxity of anterior abdominal musculature. Bones are diffusely under mineralized with diffuse degenerative change throughout the spine.  Gluteal and paravertebral muscle fatty atrophy. IMPRESSION: 1. Small bowel pneumatosis and free air in the mid abdomen, detailed assessment obscured by motion artifact. This is suspicious for bowel ischemia. No portal venous or mesenteric venous gas. 2. Skin defect consistent with sacral decubitus ulcer extends to the sacrum and coccyx. No obvious bony resorption or destruction. 3. Bilateral nonobstructing renal calculi. Aortic Atherosclerosis (ICD10-I70.0). These results were called by telephone at the time of interpretation on 10/20/2020 at 3:44 pm to Dr Ellender Hose, who verbally acknowledged these results. Electronically Signed   By: Keith Rake M.D.   On: 10/20/2020 15:44   DG Chest Port 1 View  Result Date: 10/20/2020 CLINICAL DATA:  [place] EXAM: PORTABLE CHEST 1 VIEW COMPARISON:  None. FINDINGS: The heart size and mediastinal contours are within normal limits. Both lungs are clear. The visualized skeletal structures are unremarkable. IMPRESSION: No active disease. Electronically Signed   By: Miachel Roux M.D.   On: 10/20/2020 16:11

## 2020-10-23 ENCOUNTER — Other Ambulatory Visit: Payer: Self-pay

## 2020-10-23 DIAGNOSIS — R933 Abnormal findings on diagnostic imaging of other parts of digestive tract: Secondary | ICD-10-CM

## 2020-10-23 LAB — BASIC METABOLIC PANEL
Anion gap: 9 (ref 5–15)
BUN: 71 mg/dL — ABNORMAL HIGH (ref 8–23)
CO2: 22 mmol/L (ref 22–32)
Calcium: 9.1 mg/dL (ref 8.9–10.3)
Chloride: 114 mmol/L — ABNORMAL HIGH (ref 98–111)
Creatinine, Ser: 2.4 mg/dL — ABNORMAL HIGH (ref 0.44–1.00)
GFR, Estimated: 20 mL/min — ABNORMAL LOW (ref >60)
Glucose, Bld: 75 mg/dL (ref 70–99)
Potassium: 3.6 mmol/L (ref 3.5–5.1)
Sodium: 145 mmol/L (ref 135–145)

## 2020-10-23 LAB — MAGNESIUM: Magnesium: 2.2 mg/dL (ref 1.7–2.4)

## 2020-10-23 LAB — CBC
HCT: 34.7 % — ABNORMAL LOW (ref 36.0–46.0)
Hemoglobin: 11.7 g/dL — ABNORMAL LOW (ref 12.0–15.0)
MCH: 28.4 pg (ref 26.0–34.0)
MCHC: 33.7 g/dL (ref 30.0–36.0)
MCV: 84.2 fL (ref 80.0–100.0)
Platelets: 308 10*3/uL (ref 150–400)
RBC: 4.12 MIL/uL (ref 3.87–5.11)
RDW: 17.2 % — ABNORMAL HIGH (ref 11.5–15.5)
WBC: 10.1 10*3/uL (ref 4.0–10.5)
nRBC: 0 % (ref 0.0–0.2)

## 2020-10-23 LAB — PHOSPHORUS: Phosphorus: 2.8 mg/dL (ref 2.5–4.6)

## 2020-10-23 MED ORDER — CHLORHEXIDINE GLUCONATE 0.12 % MT SOLN
15.0000 mL | Freq: Two times a day (BID) | OROMUCOSAL | Status: DC
  Administered 2020-10-23 – 2020-10-27 (×8): 15 mL via OROMUCOSAL
  Filled 2020-10-23 (×10): qty 15

## 2020-10-23 MED ORDER — AMLODIPINE BESYLATE 5 MG PO TABS
5.0000 mg | ORAL_TABLET | Freq: Every day | ORAL | Status: DC
  Administered 2020-10-23 – 2020-10-27 (×5): 5 mg via ORAL
  Filled 2020-10-23 (×5): qty 1

## 2020-10-23 MED ORDER — FLUCONAZOLE 50 MG PO TABS
50.0000 mg | ORAL_TABLET | Freq: Every day | ORAL | Status: AC
  Administered 2020-10-24 – 2020-10-25 (×2): 50 mg via ORAL
  Filled 2020-10-23 (×2): qty 1

## 2020-10-23 MED ORDER — PANTOPRAZOLE SODIUM 40 MG PO PACK
20.0000 mg | PACK | Freq: Every day | ORAL | Status: DC
  Administered 2020-10-24 – 2020-10-27 (×4): 20 mg via ORAL
  Filled 2020-10-23 (×6): qty 20

## 2020-10-23 MED ORDER — MAGIC MOUTHWASH
5.0000 mL | Freq: Three times a day (TID) | ORAL | Status: DC
  Administered 2020-10-23 – 2020-10-27 (×10): 5 mL via ORAL
  Filled 2020-10-23 (×13): qty 5

## 2020-10-23 MED ORDER — ORAL CARE MOUTH RINSE
15.0000 mL | Freq: Two times a day (BID) | OROMUCOSAL | Status: DC
  Administered 2020-10-23 – 2020-10-27 (×6): 15 mL via OROMUCOSAL

## 2020-10-23 NOTE — Progress Notes (Signed)
Pt noncompliant with allowing to turn for soap suds. Pt has had 2 bms since xray earlier today. Provider aware soap suds not completed and 2 bms. Provider messaged to not complete soap suds and they would re-evaluate in the am.

## 2020-10-23 NOTE — Progress Notes (Signed)
Pt been urinating fine after foley cathetor removal.

## 2020-10-23 NOTE — Progress Notes (Addendum)
PROGRESS NOTE                                                                             PROGRESS NOTE                                                                                                                                                                                                             Patient Demographics:    Kaitlin Wagner, is a 75 y.o. female, DOB - 1945/12/04, ZOX:[id number]  Outpatient Primary MD for the patient is Elizabeth date - 10/20/2020    Chief Complaint  Patient presents with  . Altered Mental Status  . Abnormal Lab       Brief Narrative    Kaitlin Wagner is a 75 y.o. female with medical history significant for advance dementia, weight lost, Barretts esophagus, sacral decubitus ulcer, presents to the emergency department for chief concerns of altered mentation, her work-up was significant for tented small bowels, with concern of pneumatosis and free air, she was seen by general surgery, she is poor surgical candidate, current plan to continue with conservative management close monitoring, and plan for surgical intervention if she deteriorates.   Subjective:    Kaitlin Wagner today herself she is confused, cannot give any reliable complaints, either in the answering yes to all questions, or not to all questions, but today she denies any abdominal pain.     Assessment  & Plan :    Principal Problem:   AKI (acute kidney injury) ([place]) Active Problems:   Advanced dementia ([place])   GERD (gastroesophageal reflux disease)   Barrett esophagus   Sacral wound, initial encounter   Pneumatosis intestinalis of small intestine   Pneumoperitoneum   Pressure injury of skin   Pneumatosis of intestines   Sepsis due to small bowel pneumatosis and UTI -  Renal, leukocytosis, hypothermia, with source of urine small bowel pneumatosis -general surgery Input greatly appreciated, patient is poor  candidate for surgery, given  large ventral hernia , she is a poor surgical candidate, for now continue with nonsurgical interventions , and to repeat imaging today for further evaluation . -continue with IV fluids, continue with IV Zosyn . -General surgery input greatly appreciated, white blood cell has normalized, abdomen is soft, mildly distended, evidence of peritonitis, abdominal x-ray with no further evidence of pneumatosis, she will be on clear liquid diet and monitor closely, will be very slow to advance diet to make sure there is no worsening.  Acute kidney injury - suspect secondary to prerenal, in setting of poor p.o. intake in setting of oral thrush - Serum creatinine on presentation is 3.49, eGFR 13 -Per PCP printed notes uploaded into media, previous 3 serum creatinine was 0.73 (08/22/20), 1.01 (05/03/2020), 1.57 (04/28/20) -  Continue continue to improve gradually, this morning it is 2.4, continue gentle hydration and avoid nephrotoxic medications   Oral thrush -continue with Diflucan, will start nystatin Magic mouthwash as well   depression/anxiety - previously on venlafaxine 25 mg twice daily, however this has been discontinued by her PCP  History of hypertension -Blood pressure started to increase, will start on Norvasc  History of hyperlipidemia -  no longer taking  Hypokalemia -Trend replete as needed, monitoring closely with magnesium and phosphorus for refeeding syndrome  Presence of indwelling Foley cathete - Per geriatrician note, Foley was placed 10/13/2020 for assistance with wound healing, did discontinue this admission, she is kept on puriwick  meanwhile.  Calorie and protein malnutrition - Consult to registered dietitian placed  Sacral decubitus ulcer - present on admission, wound consult placed Pressure Injury 10/20/20 Sacrum Posterior;Mid Stage 3 -  Full thickness tissue loss. Subcutaneous fat may be visible but bone, tendon or muscle are NOT exposed.  reddened (Active)  10/20/20 1800  Location: Sacrum  Location Orientation: Posterior;Mid  Staging: Stage 3 -  Full thickness tissue loss. Subcutaneous fat may be visible but bone, tendon or muscle are NOT exposed.  Wound Description (Comments): reddened  Present on Admission: Yes        Patient's geriatrician is Dr. Leward Quan and she states that she can be reached at [phone] should we have any questions regarding [name].      SpO2: 96 %  Recent Labs  Lab 10/20/20 1252 10/20/20 1356 10/20/20 1819 10/20/20 2105 10/21/20 0548 10/22/20 0432 10/23/20 0527  WBC  --  13.4*  --   --  10.0 7.2 10.1  PLT  --  392  --   --  280 300 308  PROCALCITON  --   --  <0.10  --   --   --   --   AST  --  16  --   --   --   --   --   ALT  --  23  --   --   --   --   --   ALKPHOS  --  118  --   --   --   --   --   BILITOT  --  1.0  --   --   --   --   --   ALBUMIN  --  4.4  --   --   --   --   --   LATICACIDVEN  --   --  1.9 1.9  --   --   --   SARSCOV2NAA NEGATIVE  --   --   --   --   --   --  ABG  No results found for: PHART, PCO2ART, PO2ART, HCO3, TCO2, ACIDBASEDEF, O2SAT        Condition - Extremely Guarded  Family Communication  :  D/W daughter by phone 4/25  Code Status :  DNR  Consults  :  General surgery  Procedures  :  none  Disposition Plan  :    Status is: Observation  The patient will require care spanning > 2 midnights and should be moved to inpatient because: IV treatments appropriate due to intensity of illness or inability to take PO  Dispo: The patient is from: SNF              Anticipated d/c is to: SNF              Patient currently is not medically stable to d/c.   Difficult to place patient No      DVT Prophylaxis  :   Heparin   Lab Results  Component Value Date   PLT 308 10/23/2020    Diet :  Diet Order            Diet clear liquid Room service appropriate? Yes; Fluid consistency: Thin  Diet effective now                   Inpatient Medications  Scheduled Meds: . amLODipine  5 mg Oral Daily  . chlorhexidine  15 mL Mouth Rinse BID  . Chlorhexidine Gluconate Cloth  6 each Topical Daily  . [START ON 10/24/2020] esomeprazole  10 mg Oral QAC breakfast  . [START ON 10/24/2020] fluconazole  50 mg Oral Daily  . fluticasone  1 spray Each Nare Daily  . heparin  5,000 Units Subcutaneous Q8H  . mouth rinse  15 mL Mouth Rinse q12n4p   Continuous Infusions: . sodium chloride 250 mL (10/20/20 1819)  . lactated ringers 100 mL/hr at 10/23/20 0150  . piperacillin-tazobactam (ZOSYN)  IV 2.25 g (10/23/20 0902)   PRN Meds:.sodium chloride, acetaminophen **OR** acetaminophen, hydrALAZINE, ondansetron **OR** ondansetron (ZOFRAN) IV  Antibiotics  :    Anti-infectives (From admission, onward)   Start     Dose/Rate Route Frequency Ordered Stop   10/24/20 1000  fluconazole (DIFLUCAN) tablet 50 mg        50 mg Oral Daily 10/23/20 1246     10/22/20 1400  fluconazole (DIFLUCAN) IVPB 50 mg  Status:  Discontinued        50 mg 25 mL/hr over 60 Minutes Intravenous Every 24 hours 10/22/20 1207 10/23/20 1246   10/21/20 0200  piperacillin-tazobactam (ZOSYN) IVPB 2.25 g        2.25 g 100 mL/hr over 30 Minutes Intravenous Every 8 hours 10/20/20 2224     10/20/20 2100  fluconazole (DIFLUCAN) IVPB 100 mg  Status:  Discontinued        100 mg 50 mL/hr over 60 Minutes Intravenous Every 24 hours 10/20/20 2012 10/20/20 2014   10/20/20 2100  fluconazole (DIFLUCAN) IVPB 100 mg  Status:  Discontinued        100 mg 50 mL/hr over 60 Minutes Intravenous Every 48 hours 10/20/20 2014 10/22/20 1207   10/20/20 1700  piperacillin-tazobactam (ZOSYN) IVPB 3.375 g  Status:  Discontinued        3.375 g 100 mL/hr over 30 Minutes Intravenous Every 8 hours 10/20/20 1649 10/20/20 2224   10/20/20 1545  cefTRIAXone (ROCEPHIN) 1 g in sodium chloride 0.9 % 100 mL IVPB  Status:  Discontinued  1 g 200 mL/hr over 30 Minutes Intravenous Every 24  hours 10/20/20 1544 10/20/20 1629        Kaitlin Wagner M.D on 10/23/2020 at 1:02 PM  To page go to [email or web address]  [facility] -  Office  [phone]      Objective:   Vitals:   10/22/20 2046 10/22/20 2347 10/23/20 0456 10/23/20 0934  BP: (!) 141/70 140/69 134/70 (!) 146/59  Pulse: 82 85 76 78  Resp: _0 Temp: 98.1 F (36.7 C) 98.5 F (36.9 C) 97.8 F (36.6 C)   TempSrc: Oral Oral Oral   SpO2: 100% 100% 100% 96%  Weight:      Height:        Wt Readings from Last 3 Encounters:  10/20/20 72.6 kg  05/12/18 93.4 kg  05/12/15 95.3 kg     Intake/Output Summary (Last 24 hours) at 10/23/2020 1302 Last data filed at 10/23/2020 1200 Gross per 24 hour  Intake 1523.99 ml  Output 850 ml  Net 673.99 ml     Physical Exam  Awake Alert, pleasantly demented, frail  Symmetrical Chest wall movement, Good air movement bilaterally, CTAB RRR,No Gallops,Rubs or new Murmurs, No Parasternal Heave Large ventral abdominal hernia, abdomen soft, mildly distended  no Cyanosis, Clubbing or edema, No new Rash or bruise        Data Review:    CBC Recent Labs  Lab 10/20/20 1356 10/21/20 0548 10/22/20 0432 10/23/20 0527  WBC 13.4* 10.0 7.2 10.1  HGB 14.0 11.8* 10.9* 11.7*  HCT 41.2 36.6 31.5* 34.7*  PLT 392 280 300 308  MCV 83.1 88.2 82.0 84.2  MCH 28.2 28.4 28.4 28.4  MCHC 34.0 32.2 34.6 33.7  RDW 17.0* 17.8* 17.1* 17.2*    Recent Labs  Lab 10/20/20 1356 10/20/20 1819 10/20/20 2105 10/21/20 0548 10/22/20 0432 10/23/20 0527  NA 137  --   --  139 143 145  K 4.1  --   --  3.8 3.2* 3.6  CL 107  --   --  114* 116* 114*  CO2 18*  --   --  14* 19* 22  GLUCOSE 107*  --   --  86 80 75  BUN 122*  --   --  112* 88* 71*  CREATININE 3.49*  --   --  2.97* 2.63* 2.40*  CALCIUM 10.3  --   --  9.3 9.0 9.1  AST 16  --   --   --   --   --   ALT 23  --   --   --   --   --   ALKPHOS 118  --   --   --   --   --   BILITOT 1.0  --   --   --   --   --   ALBUMIN  4.4  --   --   --   --   --   MG  --   --   --   --  2.2 2.2  PROCALCITON  --  <0.10  --   --   --   --   LATICACIDVEN  --  1.9 1.9  --   --   --   TSH  --  0.463  --   --   --   --     ------------------------------------------------------------------------------------------------------------------ No results for input(s): CHOL, HDL, LDLCALC, TRIG, CHOLHDL, LDLDIRECT in the last 72 hours.  No results found for: HGBA1C ------------------------------------------------------------------------------------------------------------------  Recent Labs    10/20/20 1819  TSH 0.463    Cardiac Enzymes No results for input(s): CKMB, TROPONINI, MYOGLOBIN in the last 168 hours.  Invalid input(s): CK ------------------------------------------------------------------------------------------------------------------ No results found for: BNP  Micro Results Recent Results (from the past 240 hour(s))  Resp Panel by RT-PCR (Flu A&B, Covid) Nasopharyngeal Swab     Status: None   Collection Time: 10/20/20 12:52 PM   Specimen: Nasopharyngeal Swab; Nasopharyngeal(NP) swabs in vial transport medium  Result Value Ref Range Status   SARS Coronavirus 2 by RT PCR NEGATIVE NEGATIVE Final    Comment: (NOTE) SARS-CoV-2 target nucleic acids are NOT DETECTED.  The SARS-CoV-2 RNA is generally detectable in upper respiratory specimens during the acute phase of infection. The lowest concentration of SARS-CoV-2 viral copies this assay can detect is 138 copies/mL. A negative result does not preclude SARS-Cov-2 infection and should not be used as the sole basis for treatment or other patient management decisions. A negative result may occur with  improper specimen collection/handling, submission of specimen other than nasopharyngeal swab, presence of viral mutation(s) within the areas targeted by this assay, and inadequate number of viral copies(<138 copies/mL). A negative result must be combined with clinical  observations, patient history, and epidemiological information. The expected result is Negative.  Fact Sheet for Patients:  EntrepreneurPulse.com.au  Fact Sheet for Healthcare Providers:  IncredibleEmployment.be  This test is no t yet approved or cleared by the [place] FDA and  has been authorized for detection and/or diagnosis of SARS-CoV-2 by FDA under an Emergency Use Authorization (EUA). This EUA will remain  in effect (meaning this test can be used) for the duration of the COVID-19 declaration under Section 564(b)(1) of the Act, 21 U.S.C.section 360bbb-3(b)(1), unless the authorization is terminated  or revoked sooner.       Influenza A by PCR NEGATIVE NEGATIVE Final   Influenza B by PCR NEGATIVE NEGATIVE Final    Comment: (NOTE) The Xpert Xpress SARS-CoV-2/FLU/RSV plus assay is intended as an aid in the diagnosis of influenza from Nasopharyngeal swab specimens and should not be used as a sole basis for treatment. Nasal washings and aspirates are unacceptable for Xpert Xpress SARS-CoV-2/FLU/RSV testing.  Fact Sheet for Patients: EntrepreneurPulse.com.au  Fact Sheet for Healthcare Providers: IncredibleEmployment.be  This test is not yet approved or cleared by the [place] FDA and has been authorized for detection and/or diagnosis of SARS-CoV-2 by FDA under an Emergency Use Authorization (EUA). This EUA will remain in effect (meaning this test can be used) for the duration of the COVID-19 declaration under Section 564(b)(1) of the Act, 21 U.S.C. section 360bbb-3(b)(1), unless the authorization is terminated or revoked.  Performed at [facility], [address], [place], [place] [address]   Urine culture     Status: Abnormal   Collection Time: 10/20/20 12:52 PM   Specimen: Urine, Random  Result Value Ref Range Status   Specimen Description   Final    URINE, RANDOM Performed  at [facility], [address], [place], [place] [address]    Special Requests   Final    NONE Performed at [facility], [address] [place], [place] [address]    Culture MULTIPLE SPECIES PRESENT, SUGGEST RECOLLECTION (A)  Final   Report Status 10/21/2020 FINAL  Final    Radiology Reports CT ABDOMEN PELVIS WO CONTRAST  Result Date: 10/20/2020 CLINICAL DATA:  Abdominal distension. Nursing home patient with dementia and sacral decubitus wound. EXAM: CT ABDOMEN AND PELVIS  WITHOUT CONTRAST TECHNIQUE: Multidetector CT imaging of the abdomen and pelvis was performed following the standard protocol without IV contrast. COMPARISON:  Most recent CT 07/30/2011 FINDINGS: Lower chest: Mild cardiomegaly. Coronary artery calcifications. Atherosclerosis and tortuosity of the included thoracic aorta. Subsegmental atelectasis in the lingula and left lower lobe. No pleural effusion or acute airspace disease. Hepatobiliary: Elongated right lobe of the liver. No evidence of focal liver abnormality on this noncontrast exam. Cholecystectomy. No obvious biliary dilatation, motion obscures detailed assessment. There is no portal venous gas. Pancreas: Parenchymal atrophy. No ductal dilatation or inflammation. Spleen: Normal in size without focal abnormality. Adrenals/Urinary Tract: No adrenal nodule. 6 mm nonobstructing stone in the lower left kidney. 3 mm nonobstructing stone in the lower right kidney. There is no hydronephrosis. Probable cyst in the upper right kidney, series 2, image 26, obscured by motion. Minor symmetric perinephric edema. No ureteral calculi. Foley catheter decompresses the urinary bladder. Equivocal bladder wall thickening. Small amount of air in the bladder is likely related to catheter. Stomach/Bowel: Bowel assessment is limited in the absence of enteric contrast as well as patient motion artifact. Diffuse gaseous distension of small bowel loops. There are areas of small bowel  pneumatosis as well as free air in the adjacent mid abdomen, detailed assessment obscured by motion, however for example series 2, image 45. Pneumatosis appears to involve a moderate segment of small bowel. Occasional small air-fluid levels. Patient is post at least partial colectomy with sigmoid colon remaining in situ, and chain sutures noted in the left abdomen. Stool in the distal colon. Vascular/Lymphatic: Advanced aortic and branch atherosclerosis. No aortic aneurysm. There is no portal venous or mesenteric venous gas. Cannot assess for small-vessel mesenteric gas given motion. No bulky abdominopelvic adenopathy. Reproductive: Unenhanced uterus grossly normal.  No adnexal mass. Other: No significant ascites or free fluid. Small volume of free air suspected in the mid abdomen adjacent to small bowel pneumatosis, without tracking under the hemidiaphragms. Musculoskeletal: Skin defect consistent sacral decubitus ulcer extends to the sacrum and coccyx. There is no obvious bony resorption or destruction. No focal fluid collection. Laxity of anterior abdominal musculature. Bones are diffusely under mineralized with diffuse degenerative change throughout the spine. Gluteal and paravertebral muscle fatty atrophy. IMPRESSION: 1. Small bowel pneumatosis and free air in the mid abdomen, detailed assessment obscured by motion artifact. This is suspicious for bowel ischemia. No portal venous or mesenteric venous gas. 2. Skin defect consistent with sacral decubitus ulcer extends to the sacrum and coccyx. No obvious bony resorption or destruction. 3. Bilateral nonobstructing renal calculi. Aortic Atherosclerosis (ICD10-I70.0). These results were called by telephone at the time of interpretation on 10/20/2020 at 3:44 pm to Dr Ellender Hose, who verbally acknowledged these results. Electronically Signed   By: Keith Rake M.D.   On: 10/20/2020 15:44   DG Chest Port 1 View  Result Date: 10/20/2020 CLINICAL DATA:  [place]alaise EXAM:  PORTABLE CHEST 1 VIEW COMPARISON:  None. FINDINGS: The heart size and mediastinal contours are within normal limits. Both lungs are clear. The visualized skeletal structures are unremarkable. IMPRESSION: No active disease. Electronically Signed   By: Miachel Roux M.D.   On: 10/20/2020 16:11   DG Abd 2 Views  Result Date: 10/22/2020 CLINICAL DATA:  History of Alzheimer's. EXAM: ABDOMEN - 2 VIEW COMPARISON:  None. FINDINGS: Visualized lung bases are normal. Small bowel dilatation remains in the central abdomen. The pneumatosis and free air seen on yesterday's CT scan are not as well visualized on this study. There  is a small amount of probable extraluminal gas remaining in the right upper quadrant. There is a paucity of colonic gas. No other abnormalities. IMPRESSION: 1. The small bowel pneumatosis and free air seen on the CT scan from October 20, 2020 is not as well appreciated today. A small amount of probable extraluminal gas remains in the right upper quadrant. 2. Small bowel dilatation may be due to ileus or obstruction. Electronically Signed   By: Dorise Bullion III M.D   On: 10/22/2020 14:07

## 2020-10-23 NOTE — Progress Notes (Signed)
[facility] SURGICAL PROGRESS NOTE (cpt [phone])  Hospital Day(s): 2.   Interval History: Patient seen and examined, no acute events or new complaints overnight. Patient reports she is feeling a little better this morning. She denies fever, chills, nausea, emesis. She remains without leukocytosis with WBC of 10.1K this morning. Renal function improved some with sCr - 2.40; UO - 850 ccs + unmeasured. No major electrolyte derangements appreciated. She has remained NPO. She did have 2 BMs reported in the last 24 hours.   Review of Systems:  She is a poor historian, unable to reliably preform  Vital signs in last 24 hours: [min-max] current  Temp:  [97.8 F (36.6 C)-98.5 F (36.9 C)] 97.8 F (36.6 C) (04/25 0456) Pulse Rate:  [76-85] 76 (04/25 0456) Resp:  [16-18] 16 (04/25 0456) BP: (133-143)/(60-70) 134/70 (04/25 0456) SpO2:  [100 %] 100 % (04/25 0456)     Height: 5\' 1"  (154.9 cm) Weight: 72.6 kg BMI (Calculated): 30.25   Intake/Output last 2 shifts:  04/24 0701 - 04/25 0700 In: 2377.8 [I.V.:1823.1; IV Piggyback:554.8] Out: 850 [Urine:850]   Physical Exam:  Constitutional: alert, cooperative and no distress  HENT: normocephalic without obvious abnormality  Eyes: PERRL, EOM's grossly intact and symmetric Respiratory: breathing non-labored at rest  Cardiovascular: regular rate and sinus rhythm  Gastrointestinal: Soft, stable soreness in the lower abdomen, suspect distension is baseline given her large hernia defect, no rebound/guarding/peritonitis  Musculoskeletal: no edema or wounds, motor and sensation grossly intact, NT    Labs:  CBC Latest Ref Rng & Units 10/23/2020 10/22/2020 10/21/2020  WBC 4.0 - 10.5 K/uL 10.1 7.2 10.0  Hemoglobin 12.0 - 15.0 g/dL 11.7(L) 10.9(L) 11.8(L)  Hematocrit 36.0 - 46.0 % 34.7(L) 31.5(L) 36.6  Platelets 150 - 400 K/uL 308 300 280   CMP Latest Ref Rng & Units 10/23/2020 10/22/2020 10/21/2020  Glucose 70 - 99 mg/dL 75 80 86  BUN 8 - 23  mg/dL 71(H) 88(H) 112(H)  Creatinine 0.44 - 1.00 mg/dL 2.40(H) 2.63(H) 2.97(H)  Sodium 135 - 145 mmol/L 145 143 139  Potassium 3.5 - 5.1 mmol/L 3.6 3.2(L) 3.8  Chloride 98 - 111 mmol/L 114(H) 116(H) 114(H)  CO2 22 - 32 mmol/L 22 19(L) 14(L)  Calcium 8.9 - 10.3 mg/dL 9.1 9.0 9.3  Total Protein 6.5 - 8.1 g/dL - - -  Total Bilirubin 0.3 - 1.2 mg/dL - - -  Alkaline Phos 38 - 126 U/L - - -  AST 15 - 41 U/L - - -  ALT 0 - 44 U/L - - -     Imaging studies: No new pertinent imaging studies   Assessment/Plan: (ICD-10's: K75.89) 75 y.o. female found to have pneumatosis, pneumoperitoneum, and small bowel distention which most likely represents ileus in the setting of UTI as she is without evidence of peritonitis.   - Okay to initiate CLD today; continue IVF support   - Closely monitor abdominal examination; on-going bowel function  - Pain control prn; antiemetics prn  - May need repeat CT in 24-48 hours pending clinical condition  - No emergent surgical interventions; she is a very poor surgical candidate   - Further management per primary service; we will follow    All of the above findings and recommendations were discussed with the patient, and the medical team.  -- Edison Simon, PA-C [facility] 10/23/2020, 7:11 AM [phone] M-F: 7am - 4pm

## 2020-10-23 NOTE — Consult Note (Signed)
WOC Nurse Consult Note: Reason for Consult:Healing stage 3 pressure injury to sacral area Wound type:Pressure Pressure Injury POA: Yes Measurement:5cm x 2cm x 0.4cm Wound bed:red, wet Drainage (amount, consistency, odor) small to moderate serous Periwound:Intact, mild maceration Dressing procedure/placement/frequency: Patient resists turning from side to side.  Episode of fecal incontinence at the time of my assessment. I will provide a mattress replacement with low air loss feature, bilateral pressure redistribution heel boots and topical care guidance using a silver hydrofiber as a wound contact layer and topping with a silicone foam dressing. Cleansing is to be with NS.    WOC nursing team will not follow, but will remain available to this patient, the nursing and medical teams.  Please re-consult if needed. Thanks, Maudie Flakes, MSN, RN, [place], Arther Abbott  Pager# [phone]

## 2020-10-23 NOTE — Evaluation (Signed)
Physical Therapy Evaluation Patient Details Name: Kaitlin Wagner MRN: [id number] DOB: 06/10/1946 Today's Date: 10/23/2020   History of Present Illness  Kaitlin Wagner is a 38yoF who comes to [facility] on 4/22 via transport van from [facility] facility due to thrush, diarrhea, poor PO intake, AMS. PMH: sacral wound, dementia, HTN, HLD, DM2, SOB, TIA, OSA. Pt is a PACE participant. Pt has had indwelling foley sicne 4/15 to facilitate scaral wound healing. Pt has been at [facility] for assistance in sacral ulcer healing for ~6 months. Here surgery consulted for "small bowel pneumatosis and free air," aiming for conservative management first, as she would not tolerate extensive surgical procedures. Recent baseline has only included a few weeks of ambulatory status due to falls anxiety, dementia, and pain inhibition from wound. Pt uses a tilt n space WC to go from [facility] to [facility] for therapies 5x/week.  Clinical Impression  Pt admitted with above diagnosis. Pt currently with functional limitations due to the deficits listed below (see "PT Problem List"). Upon entry, pt in bed, awake and minimally agreeable to participate due to baseline cognitive impairment and severe aversion to mobility due to falls anxiety. The pt is alert, pleasant, interactive, but unable to provide any info regarding prior level of function. Chief Strategy Officer obtained PLOF from pt's PT from PACE program Anne Ng). Pt follows >50% of simple commands for strength testing, but is often unable either due to falls anxiety or unclear abrupt hesitation which appears similar to pain inhibition however dementia complicates this. At baseline pt was total care, using a mechanical lift for transfers, is often extremely resistant to mobility due to falls anxiety. Given the patient's acute illness problems and new environment it is unclear how much she will participate in our services- will continue to follow to determine this capacity, although it sounds as though most of her mobility  needs can be met with NSG. Pt will benefit from skilled PT intervention to increase independence and safety with basic mobility in preparation for discharge to the venue listed below.       Follow Up Recommendations SNF;Supervision for mobility/OOB;Supervision - Intermittent    Equipment Recommendations  None recommended by PT    Recommendations for Other Services       Precautions / Restrictions Precautions Precautions: Fall Precaution Comments: Sacral wound Restrictions Weight Bearing Restrictions: No      Mobility  Bed Mobility Overal bed mobility:  (pt requires +2 for basic mobilit yin bed due to anxiety realted to falls. Pt becomes rigid and resistant to movement facilitation.)                  Transfers                    Ambulation/Gait                Stairs            Wheelchair Mobility    Modified Rankin (Stroke Patients Only)       Balance                                             Pertinent Vitals/Pain Pain Assessment: Faces (grimaces with some movements, but does confirm pain, may be complicated by dementia status and atypical anxiety.) Pain Intervention(s): Limited activity within patient's tolerance    Home Living Family/patient expects to be discharged to::  Skilled nursing facility                 Additional Comments: PACE participate, has been at [facility] for 6 months, plans for return to home eventually if able.    Prior Function Level of Independence: Needs assistance   Gait / Transfers Assistance Needed: up to 56f c RW, but mostly non ambulatory over the past 6 months. Pt using a STS lift at facility.  ADL's / Homemaking Assistance Needed: total assist for ADL. Can self feed, unclear how much help she needs with meals. Wears glasses at baseline, difficulty reading the whiteboard in room without glasses.        Hand Dominance        Extremity/Trunk Assessment   Upper  Extremity Assessment Upper Extremity Assessment: Generalized weakness;Difficult to assess due to impaired cognition    Lower Extremity Assessment Lower Extremity Assessment: Generalized weakness;Difficult to assess due to impaired cognition       Communication      Cognition Arousal/Alertness: Awake/alert Behavior [facility] Therapy: [facility]/performed;Impulsive;Anxious Overall Cognitive Status: History of cognitive impairments - at baseline                                        General Comments      Exercises     Assessment/Plan    PT Assessment Patient needs continued PT services  PT Problem List Decreased strength;Decreased range of motion;Decreased activity tolerance;Decreased balance;Decreased mobility;Decreased cognition;Decreased safety awareness;Decreased knowledge of precautions;Decreased skin integrity       PT Treatment Interventions DME instruction;Balance training;Gait training;Stair training;Functional mobility training;Therapeutic activities;Therapeutic exercise;Patient/family education;Wheelchair mobility training;Cognitive remediation    PT Goals (Current goals can be found in the Care Plan section)  Acute Rehab PT Goals PT Goal Formulation: Patient unable to participate in goal setting Time For Goal Achievement: 11/06/20    Frequency Min 2X/week   Barriers to discharge        Co-evaluation               AM-PAC PT "6 Clicks" Mobility  Outcome Measure Help needed turning from your back to your side while in a flat bed without using bedrails?: Total Help needed moving from lying on your back to sitting on the side of a flat bed without using bedrails?: Total Help needed moving to and from a bed to a chair (including a wheelchair)?: Total Help needed standing up from a chair using your arms (e.g., wheelchair or bedside chair)?: Total Help needed to walk in hospital room?: Total Help needed climbing 3-5 steps with a  railing? : Total 6 Click Score: 6    End of Session   Activity Tolerance: Other (comment);Patient limited by pain (limited by mentation/cognition impairment) Patient left: in bed;with call bell/phone within reach;with bed alarm set Nurse Communication: Mobility status PT Visit Diagnosis: Muscle weakness (generalized) (M62.81);Adult, failure to thrive (R62.7)    Time: [address]PT Time Calculation (min) (ACUTE ONLY): 11 min   Charges:   PT Evaluation $PT Eval High Complexity: 1 High         4:19 PM, 10/23/20 AEtta Grandchild PT, DPT Physical Therapist - [facility] [phone](AWarren    BWest PeavineC 10/23/2020, 4:13 PM

## 2020-10-23 NOTE — Evaluation (Addendum)
Clinical/Bedside Swallow Evaluation Patient Details  Name: Kaitlin Wagner MRN: [id number] Date of Birth: 29-Jun-1946  Today's Date: 10/23/2020 Time: SLP Start Time (ACUTE ONLY): 1215 SLP Stop Time (ACUTE ONLY): 1315 SLP Time Calculation (min) (ACUTE ONLY): 60 min  Past Medical History:  Past Medical History:  Diagnosis Date  . Cancer ([place]) colon  . Diabetes mellitus without complication ([place])   . GERD (gastroesophageal reflux disease)   . Hyperlipidemia   . Hypertension    Past Surgical History:  Past Surgical History:  Procedure Laterality Date  . CHOLECYSTECTOMY    . colon resection     laparoscopic  . COLON SURGERY    . COLONOSCOPY WITH PROPOFOL N/A 05/12/2015   Procedure: COLONOSCOPY WITH PROPOFOL;  Surgeon: Hulen Luster, MD;  Location: [facility] ENDOSCOPY;  Service: Gastroenterology;  Laterality: N/A;  . COLONOSCOPY WITH PROPOFOL N/A 05/12/2018   Procedure: COLONOSCOPY WITH PROPOFOL;  Surgeon: Toledo, Benay Pike, MD;  Location: [facility] ENDOSCOPY;  Service: Gastroenterology;  Laterality: N/A;  . ESOPHAGOGASTRODUODENOSCOPY N/A 05/12/2015   Procedure: ESOPHAGOGASTRODUODENOSCOPY (EGD);  Surgeon: Hulen Luster, MD;  Location: [facility] ENDOSCOPY;  Service: Gastroenterology;  Laterality: N/A;   HPI:  Pt is a 75 y.o. female with medical history significant for advance Dementia, weight lost, Barretts esophagus, sacral decubitus ulcer, presents to the emergency department for chief concerns of altered mentation, her work-up was significant for tented small bowels, with concern of pneumatosis and free air, she was seen by general surgery, she is poor surgical candidate, current plan to continue with conservative management close monitoring, and plan for surgical intervention if she deteriorates.  Surgery is following.   Assessment / Plan / Recommendation Clinical Impression  Pt appears to present w/ adequate oropharyngeal phase swallow function w/ trials assessed w/ No oropharyngeal phase dysphagia noted,  No neuromuscular deficits noted. Pt consumed po trials w/ No overt, clinical s/s of aspiration during po trials. Pt appears at reduced risk for aspiration following general aspiration precautions. Pt does have Advanced Dementia; any Cognitive decline can impact oropharyngeal swallowing, attention during po tasks, and impact overall oral intake. During po trials, pt consumed all trials of thin liquids via Cup/Straw (which pt held to drink) w/ jello w/ no overt coughing, decline in vocal quality, or change in respiratory presentation during/post trials. Oral phase appeared [facility] w/ timely bolus management and control of bolus propulsion for A-P transfer for swallowing. Oral clearing achieved w/ all trial consistencies. OM Exam appeared [facility] w/ no unilateral weakness noted. Speech Clear. Pt fed self holding Cup to drink w/ support -- this was encouraged for her independence as well as safety of swallowing.  Recommend continue her recommended diet by Surgery (clear liquids) d/t GI issues; Thin liquids via cup/straw - monitored. Recommend general aspiration precautions, Pills CRUSHED in Puree for safer, easier swallowing and Supervision/Monitoring at all meals as pt has advanced Dementia. Education given on Pills in Puree; general aspiration precautions to NSG. NSG to reconsult if any new needs arise. MD to advance diet (solids in diet) per discretion d/t GI issues -- recommend a mech soft consistency diet for cut meats, moist foods d/t pt missing Dentition. Speech to f/u if any difficulty noted w/ increased textured foods. NSG agreed.  SLP Visit Diagnosis: Dysphagia, unspecified (R13.10) (GI issues)    Aspiration Risk  Mild aspiration risk;Risk for inadequate nutrition/hydration (d/t Dementia but reduced following precautions)    Diet Recommendation  Clear liquids diet per MD/Surgery currently d/t GI issues. General aspiration precautions. Support at meals  w/ feeding -- pt to Hold her Own Cup when Drinking. Reduce  distractions at meals.   Medication Administration: Crushed with puree (for safer swallowing)    Other  Recommendations Recommended Consults:  (Dietician f/u) Oral Care Recommendations: Oral care BID;Oral care before and after PO;Staff/trained caregiver to provide oral care Other Recommendations:  (n/a)   Follow up Recommendations None      Frequency and Duration  (n/a)   (n/a)       Prognosis Prognosis for Safe Diet Advancement: Fair (-Good) Barriers to Reach Goals: Cognitive deficits;Time post onset;Severity of deficits      Swallow Study   General Date of Onset: 10/20/20 HPI: Pt is a 75 y.o. female with medical history significant for advance Dementia, weight lost, Barretts esophagus, sacral decubitus ulcer, presents to the emergency department for chief concerns of altered mentation, her work-up was significant for tented small bowels, with concern of pneumatosis and free air, she was seen by general surgery, she is poor surgical candidate, current plan to continue with conservative management close monitoring, and plan for surgical intervention if she deteriorates.  Surgery is following. Type of Study: Bedside Swallow Evaluation Previous Swallow Assessment: none Diet Prior to this Study: Thin liquids (clears - d/t GI) Temperature Spikes Noted: No (wbc 10.1) Respiratory Status: Room air History of Recent Intubation: No Behavior/Cognition: Alert;Cooperative;Pleasant mood;Confused;Distractible;Requires cueing (baseline Dementia) Oral Cavity Assessment: Within Functional Limits Oral Care Completed by SLP: Recent completion by staff Oral Cavity - Dentition: Missing dentition (many) Vision: Functional for self-feeding Self-Feeding Abilities: Able to feed self;Needs assist;Needs set up;Total assist (can hold own Cup to drink) Patient Positioning: Upright in bed (needed positioning) Baseline Vocal Quality: Normal Volitional Cough: Strong Volitional Swallow: Unable to elicit     Oral/Motor/Sensory Function Overall Oral Motor/Sensory Function: Within functional limits (w/ bolus management)   Ice Chips Ice chips: Within functional limits Presentation: Spoon (fed; 2 trials)   Thin Liquid Thin Liquid: Within functional limits Presentation: Cup;Self Fed;Straw (~12+ ozs)    Nectar Thick Nectar Thick Liquid: Not tested   Honey Thick Honey Thick Liquid: Not tested   Puree Puree: Not tested   Solid     Solid: Not tested        Orinda Kenner, MS, CCC-SLP Speech Language Pathologist Rehab Services [phone] Latressa Harries 10/23/2020,3:57 PM

## 2020-10-23 NOTE — Progress Notes (Signed)
SLP Cancellation Note  Patient Details Name: Kaitlin Wagner MRN: [id number] DOB: 10/13/1945   Cancelled treatment:       Reason Eval/Treat Not Completed: Medical issues which prohibited therapy;Patient not medically ready (chart reviewed; consuloted NSG re: pt's status). Per chart notes and NSG, pt remains NPO w/ MD monitoring "small bowel distention which most likely represents ileus in the setting of UTI small bowel distention which most likely represents ileus in the setting of UTI".  ST services alerted NSG to reconsult when pt is able to take an oral diet. Recommend frequent oral care for hygiene and stimulation of swallowing.       Orinda Kenner, MS, CCC-SLP Speech Language Pathologist Rehab Services [phone] [facility] 10/23/2020, 9:05 AM

## 2020-10-24 LAB — CBC
HCT: 34.2 % — ABNORMAL LOW (ref 36.0–46.0)
Hemoglobin: 11.6 g/dL — ABNORMAL LOW (ref 12.0–15.0)
MCH: 28.4 pg (ref 26.0–34.0)
MCHC: 33.9 g/dL (ref 30.0–36.0)
MCV: 83.6 fL (ref 80.0–100.0)
Platelets: 270 10*3/uL (ref 150–400)
RBC: 4.09 MIL/uL (ref 3.87–5.11)
RDW: 17 % — ABNORMAL HIGH (ref 11.5–15.5)
WBC: 12.4 10*3/uL — ABNORMAL HIGH (ref 4.0–10.5)
nRBC: 0 % (ref 0.0–0.2)

## 2020-10-24 LAB — BASIC METABOLIC PANEL
Anion gap: 8 (ref 5–15)
BUN: 48 mg/dL — ABNORMAL HIGH (ref 8–23)
CO2: 23 mmol/L (ref 22–32)
Calcium: 9 mg/dL (ref 8.9–10.3)
Chloride: 114 mmol/L — ABNORMAL HIGH (ref 98–111)
Creatinine, Ser: 2 mg/dL — ABNORMAL HIGH (ref 0.44–1.00)
GFR, Estimated: 25 mL/min — ABNORMAL LOW (ref >60)
Glucose, Bld: 80 mg/dL (ref 70–99)
Potassium: 3.4 mmol/L — ABNORMAL LOW (ref 3.5–5.1)
Sodium: 145 mmol/L (ref 135–145)

## 2020-10-24 LAB — MAGNESIUM: Magnesium: 1.7 mg/dL (ref 1.7–2.4)

## 2020-10-24 LAB — PHOSPHORUS: Phosphorus: 2 mg/dL — ABNORMAL LOW (ref 2.5–4.6)

## 2020-10-24 MED ORDER — POTASSIUM CHLORIDE CRYS ER 20 MEQ PO TBCR
40.0000 meq | EXTENDED_RELEASE_TABLET | Freq: Once | ORAL | Status: AC
  Administered 2020-10-24: 09:00:00 40 meq via ORAL
  Filled 2020-10-24: qty 2

## 2020-10-24 MED ORDER — PIPERACILLIN-TAZOBACTAM 3.375 G IVPB
3.3750 g | Freq: Three times a day (TID) | INTRAVENOUS | Status: DC
  Administered 2020-10-24 – 2020-10-27 (×10): 3.375 g via INTRAVENOUS
  Filled 2020-10-24 (×8): qty 50

## 2020-10-24 MED ORDER — MAGNESIUM SULFATE 2 GM/50ML IV SOLN
2.0000 g | Freq: Once | INTRAVENOUS | Status: AC
  Administered 2020-10-24: 09:00:00 2 g via INTRAVENOUS
  Filled 2020-10-24: qty 50

## 2020-10-24 MED ORDER — BOOST / RESOURCE BREEZE PO LIQD CUSTOM
1.0000 | Freq: Three times a day (TID) | ORAL | Status: DC
  Administered 2020-10-24: 1 via ORAL

## 2020-10-24 MED ORDER — POTASSIUM PHOSPHATES 15 MMOLE/5ML IV SOLN
20.0000 mmol | Freq: Once | INTRAVENOUS | Status: AC
  Administered 2020-10-24: 09:00:00 20 mmol via INTRAVENOUS
  Filled 2020-10-24: qty 6.67

## 2020-10-24 MED ORDER — ENSURE ENLIVE PO LIQD
237.0000 mL | Freq: Two times a day (BID) | ORAL | Status: DC
  Administered 2020-10-24 – 2020-10-27 (×7): 237 mL via ORAL

## 2020-10-24 NOTE — Consult Note (Signed)
Pharmacy Antibiotic Note  Kaitlin Wagner is a 75 y.o. female admitted on 10/20/2020 with altered mental status. CT scan  showed signs concerning for small bowel pneumatosis and small amount of free air between loops of bowel/ IAI?[facility] has been consulted for zosyn dosing.  Plan: Renal fxn improved Scr 2.40 > 2.00 Will adjust Zosyn from 2.25 gram IV q8h to 3.375gm EI IV q8h for Crcl 21.8 ml/min   Height: 5\' 1"  (154.9 cm) Weight: 72.6 kg (160 lb) IBW/kg (Calculated) : 47.8  Temp (24hrs), Avg:98.3 F (36.8 C), Min:97.6 F (36.4 C), Max:99.3 F (37.4 C)  Recent Labs  Lab 10/20/20 1356 10/20/20 1819 10/20/20 2105 10/21/20 0548 10/22/20 0432 10/23/20 0527 10/24/20 0504  WBC 13.4*  --   --  10.0 7.2 10.1 12.4*  CREATININE 3.49*  --   --  2.97* 2.63* 2.40* 2.00*  LATICACIDVEN  --  1.9 1.9  --   --   --   --     Estimated Creatinine Clearance: 21.8 mL/min (A) (by C-G formula based on SCr of 2 mg/dL (H)).    No Known Allergies  Antimicrobials this admission: Zosyn 4/22 >>   Dose adjustments this admission: 4/26 zosyn 2.25gm q8h to 3.375gm q8h  Microbiology results: 4/22 UCx: multiple species  Thank you for allowing pharmacy to be a part of this patient's care.  Jovany Disano A, PharmD 10/24/2020 9:03 AM

## 2020-10-24 NOTE — Progress Notes (Signed)
Nutrition Follow-up  DOCUMENTATION CODES:  Not applicable  INTERVENTION:   Recommend advancing to full liquids as pt is tolerating clear liquids - denies GI distress  Pt at high refeed risk; recommend monitor potassium, magnesium and phosphorus labs daily until stable. Replace as needed  Boost Breeze po TID, each supplement provides 250 kcal and 9 grams of protein  NUTRITION DIAGNOSIS:  Inadequate oral intake related to acute illness as evidenced by other (comment) (current diet order inadequate to meet estimated needs). - in progress  GOAL:  Patient will meet greater than or equal to 90% of their needs - diet advanced, will add supplements to augment intake  MONITOR:  Diet advancement,Labs,Weight trends,Skin,I & O's  REASON FOR ASSESSMENT:  Consult Assessment of nutrition requirement/status  ASSESSMENT:  75 y.o. female with medical history significant for advanced dementia, Barretts esophagus, ventral hernia, sacral decubitus ulcer, GERD, HTN and DM who presents to the emergency department for chief concerns of altered mentation and concern for pneumatosis, pneumoperitoneum and small bowel distention.  Pt resting in bed at the time of visit, awake and answering questions, unsure how reliable answers are. Diet was advanced to clear liquids and per pt - she is tolerating with no GI complaints. Surgery noted this AM diet could be advanced as pt tolerates. Pt is a high refeeding risk - K and Phosphorus both low today after diet advancement but being replaced. Agreeable to boost breeze. No deficits noted on exam, was unable to assess all sites as pt would contract and not allow for palpation. Poor dentition noted. May benefit from dysphagia 3 diet when on solid foods to help with chewing.    Diet Order Hx 4/22: NPO -> DYS 1 4/23-4/24: NPO 4/25-present: Clear Liquids  Nutritionally Relevant Scheduled Meds: . fluconazole  50 mg Oral Daily  . magic mouthwash  5 mL Oral TID  . mouth  rinse  15 mL Mouth Rinse q12n4p  . pantoprazole sodium  20 mg Oral QAC breakfast   Nutritionally Relevant Continuous Infusions: . sodium chloride 250 mL (10/20/20 1819)  . lactated ringers 100 mL/hr at 10/24/20 0420  . potassium PHOSPHATE IVPB (in mmol) 20 mmol (10/24/20 0850)   Nutritionally Relevant PRN Meds: ondansetron  Labs reviewed:   K 3.4 (L)  BUN 48 (H), creatinine 2.0 (H)  Phosphorus 2.0 (L)  Mg 1.7 (L)  SBG ranges from 75-80 mg/dL over the last 24 hours   NUTRITION - FOCUSED PHYSICAL EXAM: Flowsheet Row Most Recent Value  Orbital Region No depletion  Upper Arm Region No depletion  Thoracic and Lumbar Region Unable to assess  Buccal Region No depletion  Temple Region No depletion  Clavicle Bone Region No depletion  Clavicle and Acromion Bone Region No depletion  Scapular Bone Region No depletion  Dorsal Hand No depletion  Patellar Region No depletion  Anterior Thigh Region No depletion  Posterior Calf Region No depletion  Edema (RD Assessment) Mild  Hair Reviewed  Eyes Reviewed  Mouth Reviewed  [several teeth missing]  Skin Reviewed  Nails Reviewed     Diet Order:   Diet Order            Diet clear liquid Room service appropriate? Yes; Fluid consistency: Thin  Diet effective now                EDUCATION NEEDS:  No education needs have been identified at this time  Skin:  Skin Assessment: Reviewed RN Assessment (Stage III sacrum)  Last BM:  4/25 per  RN documentation, type 7  Height:  Ht Readings from Last 1 Encounters:  10/20/20 5\' 1"  (1.549 m)    Weight:  Wt Readings from Last 1 Encounters:  10/20/20 72.6 kg    Ideal Body Weight:  47.7 kg  BMI:  Body mass index is 30.23 kg/m.  Estimated Nutritional Needs:   Kcal:  1500-1700kcal/day  Protein:  75-85g/day  Fluid:  1.4L/day  Ranell Patrick, RD, LDN Clinical Dietitian Pager on [place]

## 2020-10-24 NOTE — Progress Notes (Signed)
[facility] SURGICAL PROGRESS NOTE (cpt [phone])  Hospital Day(s): 3.   Interval History: Patient seen and examined, no acute events or new complaints overnight. Patient reports she is doing well this morning but again it is difficult to determine how reliable this is. She denies fever, chills nausea, emesis, abdominal pain. She did have a bump in her leukocytosis to 12.4K this morning. AKI continues to improve; sCr - 2.00 this morning. She does have hypokalemia to hypophosphatemia to 2.0 this morning. She was started on CLD yesterday and seems to tolerate this well. She continues to have BM recorded. Worked with therapy and recommending SNF.   Review of Systems:  She is a poor historian, unable to reliably preform  Vital signs in last 24 hours: [min-max] current  Temp:  [97.7 F (36.5 C)-99.3 F (37.4 C)] 98.1 F (36.7 C) (04/26 0405) Pulse Rate:  [52-78] 60 (04/26 0555) Resp:  [17-20] 18 (04/26 0405) BP: (125-162)/(59-94) 136/94 (04/26 0405) SpO2:  [96 %-100 %] 100 % (04/26 0405)     Height: 5\' 1"  (154.9 cm) Weight: 72.6 kg BMI (Calculated): 30.25   Intake/Output last 2 shifts:  04/25 0701 - 04/26 0700 In: 8388.5 [P.O.:540; I.V.:7848.5] Out: 800 [Urine:800]   Physical Exam:  Constitutional: alert, cooperative and no distress  HENT: normocephalic without obvious abnormality  Eyes: PERRL, EOM's grossly intact and symmetric Respiratory: breathing non-labored at rest  Cardiovascular: regular rate and sinus rhythm  Gastrointestinal: Soft, stable soreness in the lower abdomen which is unchanged from previous examinations and appears less with distraction, suspect distension is baseline given her large hernia defect, no rebound/guarding/peritonitis  Musculoskeletal: no edema or wounds, motor and sensation grossly intact, NT    Labs:  CBC Latest Ref Rng & Units 10/24/2020 10/23/2020 10/22/2020  WBC 4.0 - 10.5 K/uL 12.4(H) 10.1 7.2  Hemoglobin 12.0 - 15.0 g/dL 11.6(L)  11.7(L) 10.9(L)  Hematocrit 36.0 - 46.0 % 34.2(L) 34.7(L) 31.5(L)  Platelets 150 - 400 K/uL 270 308 300   CMP Latest Ref Rng & Units 10/24/2020 10/23/2020 10/22/2020  Glucose 70 - 99 mg/dL 80 75 80  BUN 8 - 23 mg/dL 48(H) 71(H) 88(H)  Creatinine 0.44 - 1.00 mg/dL 2.00(H) 2.40(H) 2.63(H)  Sodium 135 - 145 mmol/L 145 145 143  Potassium 3.5 - 5.1 mmol/L 3.4(L) 3.6 3.2(L)  Chloride 98 - 111 mmol/L 114(H) 114(H) 116(H)  CO2 22 - 32 mmol/L 23 22 19(L)  Calcium 8.9 - 10.3 mg/dL 9.0 9.1 9.0  Total Protein 6.5 - 8.1 g/dL - - -  Total Bilirubin 0.3 - 1.2 mg/dL - - -  Alkaline Phos 38 - 126 U/L - - -  AST 15 - 41 U/L - - -  ALT 0 - 44 U/L - - -     Imaging studies: No new pertinent imaging studies   Assessment/Plan: (ICD-10's: K38.89) 75 y.o. female found to have pneumatosis, pneumoperitoneum, and small bowel distention which most likely represents ileus in the setting of UTI as she is without evidence of peritonitis.   - Although she has an increase in her leukocytosis, I do feel her abdominal examination is unchanged. I did discuss potential for repeating CT Abdomen/pelvis today; however, I do not think this would change the clinical course. She is a suboptimal surgical candidate.   - Okay to advance diet as tolerated    - Monitor abdominal examination; on-going bowel function  - Pain control prn; antiemetics prn  - Monitor leukocytosis; morning labs   - Further management per  primary service; we will follow    All of the above findings and recommendations were discussed with the medical team.   -- Edison Simon, PA-C  [facility] 10/24/2020, 7:37 AM [phone] M-F: 7am - 4pm

## 2020-10-24 NOTE — Progress Notes (Signed)
PROGRESS NOTE                                                                             PROGRESS NOTE                                                                                                                                                                                                             Patient Demographics:    Kaitlin Wagner, is a 75 y.o. female, DOB - [birth year], HEN:[id number]  Outpatient Primary MD for the patient is [place] date - 10/20/2020    Chief Complaint  Patient presents with  . Altered Mental Status  . Abnormal Lab       Brief Narrative    Kaitlin Wagner is a 75 y.o. female with medical history significant for advance dementia, weight lost, Barretts esophagus, sacral decubitus ulcer, presents to the emergency department for chief concerns of altered mentation, her work-up was significant for tented small bowels, with concern of pneumatosis and free air, she was seen by general surgery, she is poor surgical candidate, current plan to continue with conservative management close monitoring, and plan for surgical intervention if she deteriorates.   Subjective:    Kaitlin Wagner herself she is confused, cannot provide any reliable complaints, but no significant event as discussed with staff    Assessment  & Plan :    Principal Problem:   AKI (acute kidney injury) ([place]) Active Problems:   Advanced dementia ([place])   GERD (gastroesophageal reflux disease)   Barrett esophagus   Sacral wound, initial encounter   Pneumatosis intestinalis of small intestine   Pneumoperitoneum   Pressure injury of skin   Pneumatosis of intestines   Sepsis due to small bowel pneumatosis and UTI -  Renal, leukocytosis, hypothermia, with source of urine small bowel pneumatosis -general surgery Input greatly appreciated, patient is poor candidate for surgery, given large ventral hernia , she is a poor  surgical candidate, for now continue with  nonsurgical interventions , and to repeat imaging today for further evaluation . -continue with IV fluids, continue with IV Zosyn . -General surgery input greatly appreciated, white blood cell has normalized, abdomen is soft, mildly distended, evidence of peritonitis, abdominal x-ray with no further evidence of pneumatosis, and to advance diet as tolerated, she is on full liquid diet today .  Acute kidney injury - suspect secondary to prerenal, in setting of poor p.o. intake in setting of oral thrush - Serum creatinine on presentation is 3.49, eGFR 13 -Per PCP printed notes uploaded into media, previous 3 serum creatinine was 0.73 (08/22/20), 1.01 (05/03/2020), 1.57 (04/28/20) -  Continue continue to improve gradually, this morning it is 2 , continue gentle hydration and avoid nephrotoxic medications   Oral thrush -With Diflucan, tomorrow is the last dose, Jek mouthwash    depression/anxiety - previously on venlafaxine 25 mg twice daily, however this has been discontinued by her PCP  History of hypertension -Blood pressure improved after starting Norvasc  History of hyperlipidemia -  no longer taking  Hypokalemia -Trend replete as needed, monitoring closely with magnesium and phosphorus for refeeding syndrome  Presence of indwelling Foley cathete - Per geriatrician note, Foley was placed 10/13/2020 for assistance with wound healing, did discontinue this admission, she is kept on puriwick  meanwhile.  Discussed with her PCP Dr.Mouw, will avoid reinserting Foley catheter given her wound has significantly improved at this point.  Calorie and protein malnutrition - Consult to registered dietitian placed  Sacral decubitus ulcer - present on admission, wound consult placed Pressure Injury 10/20/20 Sacrum Posterior;Mid Stage 3 -  Full thickness tissue loss. Subcutaneous fat may be visible but bone, tendon or muscle are NOT exposed. reddened  (Active)  10/20/20 1800  Location: Sacrum  Location Orientation: Posterior;Mid  Staging: Stage 3 -  Full thickness tissue loss. Subcutaneous fat may be visible but bone, tendon or muscle are NOT exposed.  Wound Description (Comments): reddened  Present on Admission: Yes        Patient's geriatrician is Dr. Leward Quan and she states that she can be reached at [phone] should we have any questions regarding [name].      SpO2: 100 %  Recent Labs  Lab 10/20/20 1252 10/20/20 1356 10/20/20 1819 10/20/20 2105 10/21/20 0548 10/22/20 0432 10/23/20 0527 10/24/20 0504  WBC  --  13.4*  --   --  10.0 7.2 10.1 12.4*  PLT  --  392  --   --  280 300 308 270  PROCALCITON  --   --  <0.10  --   --   --   --   --   AST  --  16  --   --   --   --   --   --   ALT  --  23  --   --   --   --   --   --   ALKPHOS  --  118  --   --   --   --   --   --   BILITOT  --  1.0  --   --   --   --   --   --   ALBUMIN  --  4.4  --   --   --   --   --   --   LATICACIDVEN  --   --  1.9 1.9  --   --   --   --   SARSCOV2NAA  NEGATIVE  --   --   --   --   --   --   --        ABG  No results found for: PHART, PCO2ART, PO2ART, HCO3, TCO2, ACIDBASEDEF, O2SAT        Condition - Extremely Guarded  Family Communication  :  D/W daughter by phone 4/25  Code Status :  DNR  Consults  :  General surgery  Procedures  :  none  Disposition Plan  :    Status is: Observation  The patient will require care spanning > 2 midnights and should be moved to inpatient because: IV treatments appropriate due to intensity of illness or inability to take PO  Dispo: The patient is from: SNF              Anticipated d/c is to: SNF              Patient currently is not medically stable to d/c.   Difficult to place patient No      DVT Prophylaxis  :   Heparin   Lab Results  Component Value Date   PLT 270 10/24/2020    Diet :  Diet Order            Diet full liquid Room service appropriate?  Yes with Assist; Fluid consistency: Thin  Diet effective now                  Inpatient Medications  Scheduled Meds: . amLODipine  5 mg Oral Daily  . chlorhexidine  15 mL Mouth Rinse BID  . Chlorhexidine Gluconate Cloth  6 each Topical Daily  . feeding supplement  237 mL Oral BID BM  . fluconazole  50 mg Oral Daily  . fluticasone  1 spray Each Nare Daily  . heparin  5,000 Units Subcutaneous Q8H  . magic mouthwash  5 mL Oral TID  . mouth rinse  15 mL Mouth Rinse q12n4p  . pantoprazole sodium  20 mg Oral QAC breakfast   Continuous Infusions: . sodium chloride 250 mL (10/20/20 1819)  . lactated ringers Stopped (10/24/20 0850)  . piperacillin-tazobactam (ZOSYN)  IV 3.375 g (10/24/20 1200)   PRN Meds:.sodium chloride, hydrALAZINE, ondansetron **OR** ondansetron (ZOFRAN) IV  Antibiotics  :    Anti-infectives (From admission, onward)   Start     Dose/Rate Route Frequency Ordered Stop   10/24/20 1000  fluconazole (DIFLUCAN) tablet 50 mg        50 mg Oral Daily 10/23/20 1246 10/26/20 0959   10/24/20 1000  piperacillin-tazobactam (ZOSYN) IVPB 3.375 g        3.375 g 12.5 mL/hr over 240 Minutes Intravenous Every 8 hours 10/24/20 0913     10/22/20 1400  fluconazole (DIFLUCAN) IVPB 50 mg  Status:  Discontinued        50 mg 25 mL/hr over 60 Minutes Intravenous Every 24 hours 10/22/20 1207 10/23/20 1246   10/21/20 0200  piperacillin-tazobactam (ZOSYN) IVPB 2.25 g  Status:  Discontinued        2.25 g 100 mL/hr over 30 Minutes Intravenous Every 8 hours 10/20/20 2224 10/24/20 0913   10/20/20 2100  fluconazole (DIFLUCAN) IVPB 100 mg  Status:  Discontinued        100 mg 50 mL/hr over 60 Minutes Intravenous Every 24 hours 10/20/20 2012 10/20/20 2014   10/20/20 2100  fluconazole (DIFLUCAN) IVPB 100 mg  Status:  Discontinued        100 mg 50  mL/hr over 60 Minutes Intravenous Every 48 hours 10/20/20 2014 10/22/20 1207   10/20/20 1700  piperacillin-tazobactam (ZOSYN) IVPB 3.375 g  Status:   Discontinued        3.375 g 100 mL/hr over 30 Minutes Intravenous Every 8 hours 10/20/20 1649 10/20/20 2224   10/20/20 1545  cefTRIAXone (ROCEPHIN) 1 g in sodium chloride 0.9 % 100 mL IVPB  Status:  Discontinued        1 g 200 mL/hr over 30 Minutes Intravenous Every 24 hours 10/20/20 1544 10/20/20 1629        Shakiera Edelson M.D on 10/24/2020 at 3:32 PM  To page go to [email or web address]  [facility] -  Office  [phone]      Objective:   Vitals:   10/24/20 0405 10/24/20 0555 10/24/20 0738 10/24/20 1120  BP: (!) 136/94  (!) 152/80 (!) 133/56  Pulse: (!) 52 60 (!) 44 69  Resp: _0 Temp: 98.1 F (36.7 C)  97.6 F (36.4 C) 98.4 F (36.9 C)  TempSrc: Oral   Oral  SpO2: 100%  97% 100%  Weight:      Height:        Wt Readings from Last 3 Encounters:  10/20/20 72.6 kg  05/12/18 93.4 kg  05/12/15 95.3 kg     Intake/Output Summary (Last 24 hours) at 10/24/2020 1532 Last data filed at 10/24/2020 1345 Gross per 24 hour  Intake 8532.05 ml  Output 800 ml  Net 7732.05 ml     Physical Exam  Awake Alert, pleasant, demented, confused symmetrical  Chest wall movement, Good air movement bilaterally, CTAB RRR,No Gallops,Rubs or new Murmurs, No Parasternal Heave +ve B.Sounds, soft, has large ventral wall hernia, no rebound or guarding No Cyanosis, Clubbing or edema, No new Rash or bruise       Data Review:    CBC Recent Labs  Lab 10/20/20 1356 10/21/20 0548 10/22/20 0432 10/23/20 0527 10/24/20 0504  WBC 13.4* 10.0 7.2 10.1 12.4*  HGB 14.0 11.8* 10.9* 11.7* 11.6*  HCT 41.2 36.6 31.5* 34.7* 34.2*  PLT 392 280 300 308 270  MCV 83.1 88.2 82.0 84.2 83.6  MCH 28.2 28.4 28.4 28.4 28.4  MCHC 34.0 32.2 34.6 33.7 33.9  RDW 17.0* 17.8* 17.1* 17.2* 17.0*    Recent Labs  Lab 10/20/20 1356 10/20/20 1819 10/20/20 2105 10/21/20 0548 10/22/20 0432 10/23/20 0527 10/24/20 0504  NA 137  --   --  139 143 145 145  K 4.1  --   --  3.8 3.2* 3.6 3.4*  CL 107   --   --  114* 116* 114* 114*  CO2 18*  --   --  14* 19* 22 23  GLUCOSE 107*  --   --  86 80 75 80  BUN 122*  --   --  112* 88* 71* 48*  CREATININE 3.49*  --   --  2.97* 2.63* 2.40* 2.00*  CALCIUM 10.3  --   --  9.3 9.0 9.1 9.0  AST 16  --   --   --   --   --   --   ALT 23  --   --   --   --   --   --   ALKPHOS 118  --   --   --   --   --   --   BILITOT 1.0  --   --   --   --   --   --  ALBUMIN 4.4  --   --   --   --   --   --   MG  --   --   --   --  2.2 2.2 1.7  PROCALCITON  --  <0.10  --   --   --   --   --   LATICACIDVEN  --  1.9 1.9  --   --   --   --   TSH  --  0.463  --   --   --   --   --     ------------------------------------------------------------------------------------------------------------------ No results for input(s): CHOL, HDL, LDLCALC, TRIG, CHOLHDL, LDLDIRECT in the last 72 hours.  No results found for: HGBA1C ------------------------------------------------------------------------------------------------------------------ No results for input(s): TSH, T4TOTAL, T3FREE, THYROIDAB in the last 72 hours.  Invalid input(s): FREET3  Cardiac Enzymes No results for input(s): CKMB, TROPONINI, MYOGLOBIN in the last 168 hours.  Invalid input(s): CK ------------------------------------------------------------------------------------------------------------------ No results found for: BNP  Micro Results Recent Results (from the past 240 hour(s))  Resp Panel by RT-PCR (Flu A&B, Covid) Nasopharyngeal Swab     Status: None   Collection Time: 10/20/20 12:52 PM   Specimen: Nasopharyngeal Swab; Nasopharyngeal(NP) swabs in vial transport medium  Result Value Ref Range Status   SARS Coronavirus 2 by RT PCR NEGATIVE NEGATIVE Final    Comment: (NOTE) SARS-CoV-2 target nucleic acids are NOT DETECTED.  The SARS-CoV-2 RNA is generally detectable in upper respiratory specimens during the acute phase of infection. The lowest concentration of SARS-CoV-2 viral copies this assay  can detect is 138 copies/mL. A negative result does not preclude SARS-Cov-2 infection and should not be used as the sole basis for treatment or other patient management decisions. A negative result may occur with  improper specimen collection/handling, submission of specimen other than nasopharyngeal swab, presence of viral mutation(s) within the areas targeted by this assay, and inadequate number of viral copies(<138 copies/mL). A negative result must be combined with clinical observations, patient history, and epidemiological information. The expected result is Negative.  Fact Sheet for Patients:  EntrepreneurPulse.com.au  Fact Sheet for Healthcare Providers:  IncredibleEmployment.be  This test is no t yet approved or cleared by the [place] FDA and  has been authorized for detection and/or diagnosis of SARS-CoV-2 by FDA under an Emergency Use Authorization (EUA). This EUA will remain  in effect (meaning this test can be used) for the duration of the COVID-19 declaration under Section 564(b)(1) of the Act, 21 U.S.C.section 360bbb-3(b)(1), unless the authorization is terminated  or revoked sooner.       Influenza A by PCR NEGATIVE NEGATIVE Final   Influenza B by PCR NEGATIVE NEGATIVE Final    Comment: (NOTE) The Xpert Xpress SARS-CoV-2/FLU/RSV plus assay is intended as an aid in the diagnosis of influenza from Nasopharyngeal swab specimens and should not be used as a sole basis for treatment. Nasal washings and aspirates are unacceptable for Xpert Xpress SARS-CoV-2/FLU/RSV testing.  Fact Sheet for Patients: EntrepreneurPulse.com.au  Fact Sheet for Healthcare Providers: IncredibleEmployment.be  This test is not yet approved or cleared by the [place] FDA and has been authorized for detection and/or diagnosis of SARS-CoV-2 by FDA under an Emergency Use Authorization (EUA). This EUA will  remain in effect (meaning this test can be used) for the duration of the COVID-19 declaration under Section 564(b)(1) of the Act, 21 U.S.C. section 360bbb-3(b)(1), unless the authorization is terminated or revoked.  Performed at [facility], [address], [place], [place] [address]  Urine culture     Status: Abnormal   Collection Time: 10/20/20 12:52 PM   Specimen: Urine, Random  Result Value Ref Range Status   Specimen Description   Final    URINE, RANDOM Performed at [facility], [address], [place], [place] [address]    Special Requests   Final    NONE Performed at [facility], [address] [place],  [address]    Culture MULTIPLE SPECIES PRESENT, SUGGEST RECOLLECTION (A)  Final   Report Status 10/21/2020 FINAL  Final    Radiology Reports CT ABDOMEN PELVIS WO CONTRAST  Result Date: 10/20/2020 CLINICAL DATA:  Abdominal distension. Nursing home patient with dementia and sacral decubitus wound. EXAM: CT ABDOMEN AND PELVIS WITHOUT CONTRAST TECHNIQUE: Multidetector CT imaging of the abdomen and pelvis was performed following the standard protocol without IV contrast. COMPARISON:  Most recent CT 07/30/2011 FINDINGS: Lower chest: Mild cardiomegaly. Coronary artery calcifications. Atherosclerosis and tortuosity of the included thoracic aorta. Subsegmental atelectasis in the lingula and left lower lobe. No pleural effusion or acute airspace disease. Hepatobiliary: Elongated right lobe of the liver. No evidence of focal liver abnormality on this noncontrast exam. Cholecystectomy. No obvious biliary dilatation, motion obscures detailed assessment. There is no portal venous gas. Pancreas: Parenchymal atrophy. No ductal dilatation or inflammation. Spleen: Normal in size without focal abnormality. Adrenals/Urinary Tract: No adrenal nodule. 6 mm nonobstructing stone in the lower left kidney. 3 mm nonobstructing stone in the lower right kidney. There is  no hydronephrosis. Probable cyst in the upper right kidney, series 2, image 26, obscured by motion. Minor symmetric perinephric edema. No ureteral calculi. Foley catheter decompresses the urinary bladder. Equivocal bladder wall thickening. Small amount of air in the bladder is likely related to catheter. Stomach/Bowel: Bowel assessment is limited in the absence of enteric contrast as well as patient motion artifact. Diffuse gaseous distension of small bowel loops. There are areas of small bowel pneumatosis as well as free air in the adjacent mid abdomen, detailed assessment obscured by motion, however for example series 2, image 45. Pneumatosis appears to involve a moderate segment of small bowel. Occasional small air-fluid levels. Patient is post at least partial colectomy with sigmoid colon remaining in situ, and chain sutures noted in the left abdomen. Stool in the distal colon. Vascular/Lymphatic: Advanced aortic and branch atherosclerosis. No aortic aneurysm. There is no portal venous or mesenteric venous gas. Cannot assess for small-vessel mesenteric gas given motion. No bulky abdominopelvic adenopathy. Reproductive: Unenhanced uterus grossly normal.  No adnexal mass. Other: No significant ascites or free fluid. Small volume of free air suspected in the mid abdomen adjacent to small bowel pneumatosis, without tracking under the hemidiaphragms. Musculoskeletal: Skin defect consistent sacral decubitus ulcer extends to the sacrum and coccyx. There is no obvious bony resorption or destruction. No focal fluid collection. Laxity of anterior abdominal musculature. Bones are diffusely under mineralized with diffuse degenerative change throughout the spine. Gluteal and paravertebral muscle fatty atrophy. IMPRESSION: 1. Small bowel pneumatosis and free air in the mid abdomen, detailed assessment obscured by motion artifact. This is suspicious for bowel ischemia. No portal venous or mesenteric venous gas. 2. Skin defect  consistent with sacral decubitus ulcer extends to the sacrum and coccyx. No obvious bony resorption or destruction. 3. Bilateral nonobstructing renal calculi. Aortic Atherosclerosis (ICD10-I70.0). These results were called by telephone at the time of interpretation on 10/20/2020 at 3:44 pm to Dr Ellender Hose, who verbally acknowledged these results. Electronically Signed   By: Kaitlin Wagner  [place] M.D.   On: 10/20/2020 15:44   DG Chest Port 1 View  Result Date: 10/20/2020 CLINICAL DATA:  [place] EXAM: PORTABLE CHEST 1 VIEW COMPARISON:  None. FINDINGS: The heart size and mediastinal contours are within normal limits. Both lungs are clear. The visualized skeletal structures are unremarkable. IMPRESSION: No active disease. Electronically Signed   By: Miachel Roux M.D.   On: 10/20/2020 16:11   DG Abd 2 Views  Result Date: 10/22/2020 CLINICAL DATA:  History of Alzheimer's. EXAM: ABDOMEN - 2 VIEW COMPARISON:  None. FINDINGS: Visualized lung bases are normal. Small bowel dilatation remains in the central abdomen. The pneumatosis and free air seen on yesterday's CT scan are not as well visualized on this study. There is a small amount of probable extraluminal gas remaining in the right upper quadrant. There is a paucity of colonic gas. No other abnormalities. IMPRESSION: 1. The small bowel pneumatosis and free air seen on the CT scan from October 20, 2020 is not as well appreciated today. A small amount of probable extraluminal gas remains in the right upper quadrant. 2. Small bowel dilatation may be due to ileus or obstruction. Electronically Signed   By: Dorise Bullion III M.D   On: 10/22/2020 14:07

## 2020-10-24 NOTE — Evaluation (Signed)
Occupational Therapy Evaluation Patient Details Name: Kaitlin Wagner MRN: [id number] DOB: April 13, 1946 Today's Date: 10/24/2020    History of Present Illness Kaitlin Wagner is a 77yoF who comes to [facility] on 4/22 via transport van from [facility] facility due to thrush, diarrhea, poor PO intake, AMS. PMH: sacral wound, dementia, HTN, HLD, DM2, SOB, TIA, OSA. Pt is a PACE participant. Pt has had indwelling foley sicne 4/15 to facilitate scaral wound healing. Pt has been at [facility] for assistance in sacral ulcer healing for ~6 months. Here surgery consulted for "small bowel pneumatosis and free air," aiming for conservative management first, as she would not tolerate extensive surgical procedures. Recent baseline has only included a few weeks of ambulatory status due to falls anxiety, dementia, and pain inhibition from wound. Pt uses a tilt n space WC to go from [facility] to [facility] for therapies 5x/week.   Clinical Impression   Pt seen for OT evaluation this date. Pt alert, attends to OT, and oriented to self, place ("hospital"), and month. Per chart, pt has been at [facility] for skilled nursing care for ~39mo for wound care and a PACE program participant. Pt reports living with her daughter. Pt declines bed mobility attempts for repositioning. Agreeable to grooming tasks at bed level. Pt set up with warm washcloth and demo's ability to initiate washing her face with VC requiring MIN-MOD A to complete task. Pt set up with toothpaste and able to unscrew the cap with VC. Hand over hand assist to squeeze toothpaste onto toothbrush. VC to initiate brushing and MOD A to complete brushing. Cues required for task initiation and continuation, ultimately requiring assist to complete tasks to ensure thoroughness. Pt may benefit from additional skilled OT services to maximize return to PLOF and minimize caregiver burden. Recommend return to short term rehab at SNF to support eventual return home as appropriate.     Follow Up  Recommendations  SNF    Equipment Recommendations  Other (comment) (defer to next venue of care)    Recommendations for Other Services       Precautions / Restrictions Precautions Precautions: Fall Precaution Comments: Sacral wound Restrictions Weight Bearing Restrictions: No      Mobility Bed Mobility               General bed mobility comments: Pt declined despite encouragement for repositioning, note fear of falling; anticipate need for significant assist    Transfers                      Balance                                           ADL either performed or assessed with clinical judgement   ADL Overall ADL's : Needs assistance/impaired   Eating/Feeding Details (indicate cue type and reason): Pt reports being done with breakfast, note <25% of liquid meal items consumed Grooming: Wash/dry face;Oral care;Set up;Moderate assistance;Bed level Grooming Details (indicate cue type and reason): Pt set up with wash cloth and VC to initiate washing her face, requiring MOD A to complete for thoroughness; set up of toothpaste and VC to continue to unscrew cap, hand over hand MOD A for squeezing toothpaste onto brush, VC to initiate brushing and ultimately requiring MOD A to complete for thoroughness/sequencing Upper Body Bathing: Maximal assistance;Bed level   Lower Body Bathing: Bed level;Total  assistance   Upper Body Dressing : Bed level;Maximal assistance   Lower Body Dressing: Bed level;Total assistance                       Vision Baseline Vision/History: Wears glasses Wears Glasses: At all times (glasses do not appear to be present in pt's room) Patient Visual Report: No change from baseline       Perception     Praxis      Pertinent Vitals/Pain Pain Assessment: Faces Faces Pain Scale: No hurt     Hand Dominance     Extremity/Trunk Assessment Upper Extremity Assessment Upper Extremity Assessment: Generalized  weakness;Difficult to assess due to impaired cognition   Lower Extremity Assessment Lower Extremity Assessment: Difficult to assess due to impaired cognition;Generalized weakness       Communication     Cognition Arousal/Alertness: Awake/alert Behavior During Therapy: Flat affect Overall Cognitive Status: History of cognitive impairments - at baseline                                 General Comments: Pt alert, oriented to self, place, and month. Follows commands with increased time and occasional cues   General Comments       Exercises     Shoulder Instructions      Home Living Family/patient expects to be discharged to:: Skilled nursing facility                                 Additional Comments: PACE participate, has been at [facility] for 6 months, plans for return to home eventually if able.      Prior Functioning/Environment Level of Independence: Needs assistance  Gait / Transfers Assistance Needed: up to 49ft c RW, but mostly non ambulatory over the past 6 months. Pt using a STS lift at facility. ADL's / Homemaking Assistance Needed: total assist for ADL. Can self feed, unclear how much help she needs with meals. Wears glasses at baseline, difficulty reading the whiteboard in room without glasses.            OT Problem List: Decreased strength;Decreased cognition;Decreased knowledge of use of DME or AE;Impaired balance (sitting and/or standing)      OT Treatment/Interventions: Self-care/ADL training;Therapeutic exercise;Therapeutic activities;Cognitive remediation/compensation;DME and/or AE instruction;Patient/family education;Balance training    OT Goals(Current goals can be found in the care plan section) Acute Rehab OT Goals Patient Stated Goal: go home OT Goal Formulation: With patient Time For Goal Achievement: 11/07/20 Potential to Achieve Goals: Good ADL Goals Pt Will Perform Eating: with supervision;with set-up (bed  or w/c level, cues as needed for sequencing) Pt Will Perform Grooming: with set-up;with supervision;with min assist (bed or w/c level, cues as needed for sequencing, PRN MIN A to complete) Additional ADL Goal #1: Pt will perform bed mobility with MOD A +1 to support repositioning, wound care, and ADL participation.  OT Frequency: Min 1X/week   Barriers to D/C:            Co-evaluation              AM-PAC OT "6 Clicks" Daily Activity     Outcome Measure Help from another person eating meals?: A Little Help from another person taking care of personal grooming?: A Lot Help from another person toileting, which includes using toliet, bedpan, or urinal?: Total Help from  another person bathing (including washing, rinsing, drying)?: A Lot Help from another person to put on and taking off regular upper body clothing?: A Lot Help from another person to put on and taking off regular lower body clothing?: Total 6 Click Score: 11   End of Session    Activity Tolerance: Patient tolerated treatment well Patient left: in bed;with call bell/phone within reach;with bed alarm set  OT Visit Diagnosis: Other abnormalities of gait and mobility (R26.89);Other symptoms and signs involving cognitive function;Muscle weakness (generalized) (M62.81)                Time: 6160-7371 OT Time Calculation (min): 16 min Charges:  OT General Charges $OT Visit: 1 Visit OT Evaluation $OT Eval Moderate Complexity: 1 Mod  Hanley Hays, MPH, MS, OTR/L ascom [phone] 10/24/20, 9:51 AM

## 2020-10-24 NOTE — Progress Notes (Signed)
Physical Therapy Treatment Patient Details Name: Kaitlin Wagner MRN: [id number] DOB: 1946/03/04 Today's Date: 10/24/2020    History of Present Illness Kaitlin Wagner is a 77yoF who comes to [facility] on 4/22 via transport van from [facility] facility due to thrush, diarrhea, poor PO intake, AMS. PMH: sacral wound, dementia, HTN, HLD, DM2, SOB, TIA, OSA. Pt is a PACE participant. Pt has had indwelling foley sicne 4/15 to facilitate scaral wound healing. Pt has been at [facility] for assistance in sacral ulcer healing for ~6 months. Here surgery consulted for "small bowel pneumatosis and free air," aiming for conservative management first, as she would not tolerate extensive surgical procedures. Recent baseline has only included a few weeks of ambulatory status due to falls anxiety, dementia, and pain inhibition from wound. Pt uses a tilt n space WC to go from [facility] to [facility] for therapies 5x/week.    PT Comments    Patient educated on importance of routine repositioning to prevent against skin breakdown. Educated patient on techniques to facilitate independence with rolling in the bed, however patient unable to demonstrate. Patient required total assistance for rolling to the right and was resistive with movement. Patient refused to attempt further mobility. Patient participated with LE strengthening exercises in bed with maximal cues and encouragement. Recommend PT follow up to maximize independence as patient willing to participate. SNF recommended at discharge.    Follow Up Recommendations  SNF;Supervision for mobility/OOB;Supervision - Intermittent     Equipment Recommendations  None recommended by PT    Recommendations for Other Services       Precautions / Restrictions Precautions Precautions: Fall Precaution Comments: Sacral wound Restrictions Weight Bearing Restrictions: No    Mobility  Bed Mobility Overal bed mobility: Needs Assistance Bed Mobility: Rolling           General bed mobility  comments: faciliation and verbal cues for technique for rolling for routine repositioning in bed to prevent against skin breakdown. total assistance for rolling to right and patient resistive with mobility efforts. patient refused to attempt sitting up on edge of bed    Transfers                    Ambulation/Gait                 Stairs             Wheelchair Mobility    Modified Rankin (Stroke Patients Only)       Balance                                            Cognition Arousal/Alertness: Awake/alert Behavior During Therapy: Flat affect Overall Cognitive Status: History of cognitive impairments - at baseline                                 General Comments: patient able to follow commands with increased time      Exercises General Exercises - Lower Extremity Ankle Circles/Pumps: AAROM;Strengthening;Both;10 reps;Supine Heel Slides: AAROM;Strengthening;Both;10 reps;Supine Hip ABduction/ADduction: AAROM;Strengthening;Both;10 reps;Supine Straight Leg Raises: AAROM;Strengthening;Both;10 reps;Supine Other Exercises Other Exercises: verbal and visual cues for exercise technique. patient does actively participate with exercises with maximal cueing    General Comments        Pertinent Vitals/Pain Pain Assessment: Faces Faces Pain Scale: No hurt Pain  Location: patient reports buttock pain but unable to rate intensity level Pain Descriptors / Indicators: Discomfort Pain Intervention(s): Repositioned    Home Living Family/patient expects to be discharged to:: Skilled nursing facility               Additional Comments: PACE participate, has been at [facility] for 6 months, plans for return to home eventually if able.    Prior Function Level of Independence: Needs assistance  Gait / Transfers Assistance Needed: up to 22ft c RW, but mostly non ambulatory over the past 6 months. Pt using a STS lift at  facility. ADL's / Homemaking Assistance Needed: total assist for ADL. Can self feed, unclear how much help she needs with meals. Wears glasses at baseline, difficulty reading the whiteboard in room without glasses.     PT Goals (current goals can now be found in the care plan section) Acute Rehab PT Goals Patient Stated Goal: none stated PT Goal Formulation: Patient unable to participate in goal setting Time For Goal Achievement: 11/06/20 Progress towards PT goals: Not progressing toward goals - comment    Frequency    Min 2X/week      PT Plan Current plan remains appropriate    Co-evaluation              AM-PAC PT "6 Clicks" Mobility   Outcome Measure  Help needed turning from your back to your side while in a flat bed without using bedrails?: Total Help needed moving from lying on your back to sitting on the side of a flat bed without using bedrails?: Total Help needed moving to and from a bed to a chair (including a wheelchair)?: Total Help needed standing up from a chair using your arms (e.g., wheelchair or bedside chair)?: Total Help needed to walk in hospital room?: Total Help needed climbing 3-5 steps with a railing? : Total 6 Click Score: 6    End of Session   Activity Tolerance:  (limited by cognitive impariments, willingness to participate with mobility efforts) Patient left: in bed;with call bell/phone within reach;with bed alarm set Nurse Communication: Mobility status PT Visit Diagnosis: Muscle weakness (generalized) (M62.81);Adult, failure to thrive (R62.7)     Time: 1142-1206 PT Time Calculation (min) (ACUTE ONLY): 24 min  Charges:  $Therapeutic Exercise: 8-22 mins $Therapeutic Activity: 8-22 mins                     Minna Merritts, PT, MPT    Percell Locus 10/24/2020, 12:46 PM

## 2020-10-24 NOTE — TOC Initial Note (Signed)
Transition of Care [facility]) - Initial/Assessment Note    Patient Details  Name: Kaitlin Wagner MRN: [id number] Date of Birth: [birth year]  Transition of Care [facility]) CM/SW Contact:    Shelbie Hutching, RN Phone Number: 10/24/2020, 2:31 PM  Clinical Narrative:                 Patient admitted to the hospital with acute kidney injury and free air in small bowel.  Patient is from [facility], confirmed with Hilda Blades at [facility] that patient is there long term.  Patient is part of the [place] program.  Claudia Desanctis is aware of hospital admission, they should be able to transport patient back to facility at discharge.    Expected Discharge Plan: [place] Barriers to Discharge: Continued Medical Work up   Patient Goals and CMS Choice Patient states their goals for this hospitalization and ongoing recovery are:: PACE patient- plan for return to [facility] [email or web address] Compare Post Acute Care list provided to:: Patient Represenative (must comment) Choice offered to / list presented to : Adult Children  Expected Discharge Plan and Services Expected Discharge Plan: [place]   Discharge Planning Services: CM Consult Post Acute Care Choice: [place] Living arrangements for the past 2 months: [place]                 DME Arranged: N/A DME Agency: NA       HH Arranged: NA [place] Agency: NA        Prior Living Arrangements/Services Living arrangements for the past 2 months: [place] Lives with:: Facility Resident Patient language and need for interpreter reviewed:: Yes Do you feel safe going back to the place where you live?: Yes      Need for Family Participation in Patient Care: Yes (Comment) Care giver support system in place?: Yes (comment) (PACE and daughter) Current home services: DME (wheelchair) Criminal Activity/Legal Involvement Pertinent to Current Situation/Hospitalization: No - Comment as  needed  Activities of Daily Living Home Assistive Devices/Equipment: Bedside commode/3-in-1,Hoyer Lift,Hospital bed,Wheelchair ADL Screening (condition at time of admission) Patient's cognitive ability adequate to safely complete daily activities?: No Is the patient deaf or have difficulty hearing?: No Does the patient have difficulty seeing, even when wearing glasses/contacts?: No Does the patient have difficulty concentrating, remembering, or making decisions?: Yes Patient able to express need for assistance with ADLs?: Yes Does the patient have difficulty dressing or bathing?: Yes Independently performs ADLs?: No Communication: Needs assistance Is this a change from baseline?: Pre-admission baseline Dressing (OT): Dependent Is this a change from baseline?: Pre-admission baseline Grooming: Dependent Is this a change from baseline?: Pre-admission baseline Feeding: Needs assistance Is this a change from baseline?: Pre-admission baseline Bathing: Dependent Is this a change from baseline?: Pre-admission baseline Toileting: Dependent Is this a change from baseline?: Pre-admission baseline In/Out Bed: Dependent Is this a change from baseline?: Pre-admission baseline Walks in Home: Dependent Is this a change from baseline?: Pre-admission baseline Does the patient have difficulty walking or climbing stairs?: Yes Weakness of Legs: Both Weakness of Arms/Hands: Both  Permission Sought/Granted Permission sought to share information with : Case Nature conservation officer Permission granted to share information with : Yes, Verbal Permission Granted     Permission granted to share info w AGENCY: PACE        Emotional Assessment Appearance:: Appears stated age     Orientation: : Oriented to Self Alcohol / Substance Use: Not Applicable Psych Involvement:  No (comment)  Admission diagnosis:  Confusion [R41.0] Elevated BUN [R79.9] SIRS (systemic inflammatory response  syndrome) (HCC) [R65.10] AKI (acute kidney injury) ([place]) [N17.9] Pneumatosis of intestines [K63.89] Patient Active Problem List   Diagnosis Date Noted  . Pressure injury of skin 10/21/2020  . Pneumatosis of intestines 10/21/2020  . AKI (acute kidney injury) ([place]) 10/20/2020  . Advanced dementia ([place]) 10/20/2020  . GERD (gastroesophageal reflux disease) 10/20/2020  . 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  . Sacral wound, initial encounter 10/20/2020  . Pneumatosis intestinalis of small intestine 10/20/2020  . Pneumoperitoneum    PCP:  [place]:  No Pharmacies Listed    Social Determinants of Health (SDOH) Interventions    Readmission Risk Interventions No flowsheet data found.

## 2020-10-25 DIAGNOSIS — A419 Sepsis, unspecified organism: Secondary | ICD-10-CM

## 2020-10-25 DIAGNOSIS — I10 Essential (primary) hypertension: Secondary | ICD-10-CM

## 2020-10-25 DIAGNOSIS — E876 Hypokalemia: Secondary | ICD-10-CM

## 2020-10-25 DIAGNOSIS — B370 Candidal stomatitis: Secondary | ICD-10-CM

## 2020-10-25 DIAGNOSIS — F32A Depression, unspecified: Secondary | ICD-10-CM

## 2020-10-25 LAB — CBC
HCT: 35.8 % — ABNORMAL LOW (ref 36.0–46.0)
Hemoglobin: 11.7 g/dL — ABNORMAL LOW (ref 12.0–15.0)
MCH: 28.1 pg (ref 26.0–34.0)
MCHC: 32.7 g/dL (ref 30.0–36.0)
MCV: 85.9 fL (ref 80.0–100.0)
Platelets: 250 10*3/uL (ref 150–400)
RBC: 4.17 MIL/uL (ref 3.87–5.11)
RDW: 17.2 % — ABNORMAL HIGH (ref 11.5–15.5)
WBC: 10.6 10*3/uL — ABNORMAL HIGH (ref 4.0–10.5)
nRBC: 0 % (ref 0.0–0.2)

## 2020-10-25 LAB — PHOSPHORUS: Phosphorus: 2.7 mg/dL (ref 2.5–4.6)

## 2020-10-25 LAB — BASIC METABOLIC PANEL
Anion gap: 11 (ref 5–15)
BUN: 32 mg/dL — ABNORMAL HIGH (ref 8–23)
CO2: 23 mmol/L (ref 22–32)
Calcium: 8.8 mg/dL — ABNORMAL LOW (ref 8.9–10.3)
Chloride: 108 mmol/L (ref 98–111)
Creatinine, Ser: 1.67 mg/dL — ABNORMAL HIGH (ref 0.44–1.00)
GFR, Estimated: 32 mL/min — ABNORMAL LOW (ref >60)
Glucose, Bld: 71 mg/dL (ref 70–99)
Potassium: 3.7 mmol/L (ref 3.5–5.1)
Sodium: 142 mmol/L (ref 135–145)

## 2020-10-25 LAB — MAGNESIUM: Magnesium: 2.2 mg/dL (ref 1.7–2.4)

## 2020-10-25 MED ORDER — ALBUMIN HUMAN 25 % IV SOLN: 25.0000 g | Freq: Once | INTRAVENOUS | Status: DC

## 2020-10-25 MED ORDER — POTASSIUM CHLORIDE CRYS ER 20 MEQ PO TBCR
20.0000 meq | EXTENDED_RELEASE_TABLET | Freq: Once | ORAL | Status: AC
  Administered 2020-10-25: 09:00:00 20 meq via ORAL
  Filled 2020-10-25: qty 1

## 2020-10-25 NOTE — Progress Notes (Signed)
[facility] SURGICAL PROGRESS NOTE (cpt [id number])  Hospital Day(s): 4.   Interval History: Patient seen and examined, no acute events or new complaints overnight. Patient reports she is doing well, not the most reliable historian. she denies abdominal pain, nausea, emesis. Transient increase in leukocytosis yesterday now improved to almost normal at 10.6K. Her renal function continues to make improvements with scr - 1.67 this morning; UO - 910 ccs + unmeasured. No significant electrolyte derangements. No new imaging this morning. She was able to tolerate advancement to full liquids yesterday afternoon. Working with therapies; recommending SNF.   Review of Systems:  She is a poor historian, unable to reliably preform  Vital signs in last 24 hours: [min-max] current  Temp:  [97.6 F (36.4 C)-98.4 F (36.9 C)] 97.6 F (36.4 C) (04/27 0415) Pulse Rate:  [44-81] 62 (04/27 0415) Resp:  [15-20] 15 (04/27 0415) BP: (132-152)/(56-105) 142/60 (04/27 0415) SpO2:  [97 %-100 %] 100 % (04/27 0415)     Height: 5\' 1"  (154.9 cm) Weight: 72.6 kg BMI (Calculated): 30.25   Intake/Output last 2 shifts:  04/26 0701 - 04/27 0700 In: 683.6 [P.O.:180; I.V.:139.9; IV Piggyback:363.7] Out: 910 [Urine:910]   Physical Exam:  Constitutional: alert, cooperative and no distress  HENT: normocephalic without obvious abnormality  Eyes: PERRL, EOM's grossly intact and symmetric Respiratory: breathing non-labored at rest  Cardiovascular: regular rate and sinus rhythm  Gastrointestinal:Soft,stable soreness in the lower abdomen which is unchanged from previous examinations and appears less with distraction but again this is not reliable given mental status,suspect distension is baseline given her large hernia defect, no rebound/guarding/peritonitis Musculoskeletal: no edema or wounds, motor and sensation grossly intact, NT    Labs:  CBC Latest Ref Rng & Units 10/25/2020 10/24/2020 10/23/2020  WBC 4.0 -  10.5 K/uL 10.6(H) 12.4(H) 10.1  Hemoglobin 12.0 - 15.0 g/dL 11.7(L) 11.6(L) 11.7(L)  Hematocrit 36.0 - 46.0 % 35.8(L) 34.2(L) 34.7(L)  Platelets 150 - 400 K/uL 250 270 308   CMP Latest Ref Rng & Units 10/25/2020 10/24/2020 10/23/2020  Glucose 70 - 99 mg/dL 71 80 75  BUN 8 - 23 mg/dL 32(H) 48(H) 71(H)  Creatinine 0.44 - 1.00 mg/dL 1.67(H) 2.00(H) 2.40(H)  Sodium 135 - 145 mmol/L 142 145 145  Potassium 3.5 - 5.1 mmol/L 3.7 3.4(L) 3.6  Chloride 98 - 111 mmol/L 108 114(H) 114(H)  CO2 22 - 32 mmol/L 23 23 22   Calcium 8.9 - 10.3 mg/dL 8.8(L) 9.0 9.1  Total Protein 6.5 - 8.1 g/dL - - -  Total Bilirubin 0.3 - 1.2 mg/dL - - -  Alkaline Phos 38 - 126 U/L - - -  AST 15 - 41 U/L - - -  ALT 0 - 44 U/L - - -    Imaging studies: No new pertinent imaging studies   Assessment/Plan: (ICD-10's: K63.89) 75 y.o.femalefound to havepneumatosis, pneumoperitoneum, and small bowel distentionwhich most likely represents ileus in the setting of UTI as she is without evidence of peritonitis.   - Okay to advance diet to soft/regular diet  - No surgical interventions warranted at this time  - Monitor abdominal examination; on-going bowel function             - Pain control prn; antiemetics prn             - Monitor leukocytosis; improved   - Further management per primary service    - Nothing further to add from surgical perspective; we will sign off  All of the above findings  and recommendations were discussed with the medical team.   -- Edison Simon, PA-C [facility] 10/25/2020, 7:06 AM [phone] M-F: 7am - 4pm

## 2020-10-25 NOTE — Progress Notes (Signed)
PROGRESS NOTE    Kaitlin Wagner  BJY:[id number] DOB: December 19, 1945 DOA: 10/20/2020 PCP: [place]    Chief Complaint  Patient presents with  . Altered Mental Status  . Abnormal Lab    Brief Narrative:  Kaitlin Darin Normanis a 75 y.o.femalewith medical history significant foradvance dementia, weight lost, Barretts esophagus,sacral decubitus ulcer, presents to the emergency department for chief concerns of altered mentation, her work-up was significant for tented small bowels, with concern of pneumatosis and free air, she was seen by general surgery, she is poor surgical candidate, current plan to continue with conservative management close monitoring, and plan for surgical intervention if she deteriorates.   Assessment & Plan:   Principal Problem:   AKI (acute kidney injury) ([place]) Active Problems:   Advanced dementia ([place])   GERD (gastroesophageal reflux disease)   Barrett esophagus   Sacral wound, initial encounter   Pneumatosis intestinalis of small intestine   Pneumoperitoneum   Pressure injury of skin   Pneumatosis of intestines  #1 sepsis due to small bowel pneumatosis and UTI -Patient came in with a leukocytosis, acute kidney injury, hypothermia, source noted as the urine, small bowel pneumatosis. -Patient seen in consultation by general surgery and patient deemed a poor surgical candidate given her large ventral hernia. -Patient slowly improving clinically. -Patient was started on clear liquid diet and diet advanced to a full liquid diet which patient is tolerating. -WBC improved, abdomen soft, distended, abdominal x-ray today with no further evidence of pneumatosis. -General surgery recommending conservative management and advancing diet as tolerated to a soft diet -Continue full liquid diet through today and advance to a soft diet for breakfast and follow -General surgery following and appreciate input and recommendations.  2.  Acute kidney  injury -Felt secondary to prerenal azotemia in the setting of poor oral intake in the setting of oral thrush. -Creatinine on presentation was 3.49. - Baseline creatinine from PCPs notes was 0.73 (08/22/2020), 1.01(05/03/2020), 1.57(04/28/2020) -Renal function improving with gentle hydration and creatinine currently at 1.67 today. -Continue hydration and follow.  3.  Oral thrush -Continue Diflucan. -Magic mouthwash.  4.  Depression/anxiety -Was noted to have been on venlafaxine 25 mg twice daily however noted to have been discontinued by PCP. -Outpatient follow-up.  5.  Hypertension -Norvasc.  6.  Hyperlipidemia -No longer on a statin.  Outpatient follow-up with PCP.  7.  Hypokalemia -Potassium at 3.7.  Phosphorus at 2.7.  Magnesium at 2.2.  Follow.  8.  Presence of indwelling Foley catheter -It was noted per geriatrician note, Foley was placed 10/13/2020 for assistance with wound healing. -Foley catheter discontinued during this admission and patient currently on pure wick. -Dr. Waldron Labs discussed with patient's PCP, Dr. Wilford Grist, and decision was made to avoid reinserting Foley catheter given wound has significantly improved.  9.  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 Subcutaneous fat may be visible but bone, tendon or muscle are NOT exposed. reddened (Active)  10/20/20 1800  Location: Sacrum  Location Orientation: Posterior;Mid  Staging: Stage 3 -  Full thickness tissue loss. Subcutaneous fat may be visible but bone, tendon or muscle are NOT exposed.  Wound Description (Comments): reddened  Present on Admission: Yes        DVT prophylaxis: Heparin Code Status: DNR Family Communication: No family at bedside Disposition:   Status is: Inpatient    Dispo: The patient is from: SNF  Anticipated d/c is to: To be determined              Patient currently on full liquid  diet, diet being advanced, on IV antibiotics, not stable for discharge   Difficult to place patient undetermined       Consultants:   General surgery: Dr. Hampton Abbot 10/20/2020  Wound care: Phineas Douglas, RN 10/23/2020  Procedures:   CT abdomen and pelvis 10/20/2020  Abdominal films 10/22/2020  Chest x-ray 10/20/2020  Antimicrobials:   Diflucan  IV Zosyn 10/20/2020>>>> 10/25/2020   Subjective: Lasying in bed. Denies CP. No SOB. No ABD PAIN .  Patient denies any emesis or nausea.  Tolerating full liquids.    Objective: Vitals:   10/24/20 1934 10/24/20 2359 10/25/20 0415 10/25/20 0809  BP: (!) 132/105 139/75 (!) 142/60 (!) 140/96  Pulse: 81 66 62 71  Resp: 20 16 15 16   Temp: 98.3 F (36.8 C) 98.1 F (36.7 C) 97.6 F (36.4 C) 97.6 F (36.4 C)  TempSrc:  Oral Oral   SpO2: 98% 100% 100% 100%  Weight:      Height:        Intake/Output Summary (Last 24 hours) at 10/25/2020 1243 Last data filed at 10/25/2020 1009 Gross per 24 hour  Intake 503.55 ml  Output 910 ml  Net -406.45 ml   Filed Weights   10/20/20 1222  Weight: 72.6 kg    Examination:  General exam: Appears calm and comfortable  Respiratory system: Clear to auscultation. Respiratory effort normal. Cardiovascular system: S1 & S2 heard, RRR. No JVD, murmurs, rubs, gallops or clicks. No pedal edema. Gastrointestinal system: Abdomen is soft, distended, nontender to palpation, ventral hernia, positive bowel sounds.  No rebound.  No guarding.   Central nervous system: Alert. No focal neurological deficits. Extremities: Symmetric 5 x 5 power. Skin: No rashes, lesions or ulcers Psychiatry: Judgement and insight appear normal. Mood & affect appropriate.     Data Reviewed: I have personally reviewed following labs and imaging studies  CBC: Recent Labs  Lab 10/21/20 0548 10/22/20 0432 10/23/20 0527 10/24/20 0504 10/25/20 0358  WBC 10.0 7.2 10.1 12.4* 10.6*  HGB 11.8* 10.9* 11.7* 11.6* 11.7*  HCT 36.6 31.5*  34.7* 34.2* 35.8*  MCV 88.2 82.0 84.2 83.6 85.9  PLT 280 300 308 270 481    Basic Metabolic Panel: Recent Labs  Lab 10/21/20 0548 10/22/20 0432 10/23/20 0527 10/24/20 0504 10/25/20 0358  NA 139 143 145 145 142  K 3.8 3.2* 3.6 3.4* 3.7  CL 114* 116* 114* 114* 108  CO2 14* 19* 22 23 23   GLUCOSE 86 80 75 80 71  BUN 112* 88* 71* 48* 32*  CREATININE 2.97* 2.63* 2.40* 2.00* 1.67*  CALCIUM 9.3 9.0 9.1 9.0 8.8*  MG  --  2.2 2.2 1.7 2.2  PHOS  --  3.4 2.8 2.0* 2.7    GFR: Estimated Creatinine Clearance: 26.1 mL/min (A) (by C-G formula based on SCr of 1.67 mg/dL (H)).  Liver Function Tests: Recent Labs  Lab 10/20/20 1356  AST 16  ALT 23  ALKPHOS 118  BILITOT 1.0  PROT 9.2*  ALBUMIN 4.4    CBG: Recent Labs  Lab 10/20/20 1259 10/22/20 0755  GLUCAP 117* 79     Recent Results (from the past 240 hour(s))  Resp Panel by RT-PCR (Flu A&B, Covid) Nasopharyngeal Swab     Status: None   Collection Time: 10/20/20 12:52 PM   Specimen: Nasopharyngeal Swab; Nasopharyngeal(NP) swabs in vial transport medium  Result Value Ref Range Status   SARS Coronavirus 2 by RT PCR NEGATIVE NEGATIVE Final    Comment: (NOTE) SARS-CoV-2 target nucleic acids are NOT DETECTED.  The SARS-CoV-2 RNA is generally detectable in upper respiratory specimens during the acute phase of infection. The lowest concentration of SARS-CoV-2 viral copies this assay can detect is 138 copies/mL. A negative result does not preclude SARS-Cov-2 infection and should not be used as the sole basis for treatment or other patient management decisions. A negative result may occur with  improper specimen collection/handling, submission of specimen other than nasopharyngeal swab, presence of viral mutation(s) within the areas targeted by this assay, and inadequate number of viral copies(<138 copies/mL). A negative result must be combined with clinical observations, patient history, and epidemiological information. The  expected result is Negative.  Fact Sheet for Patients:  EntrepreneurPulse.com.au  Fact Sheet for Healthcare Providers:  IncredibleEmployment.be  This test is no t yet approved or cleared by the [place] FDA and  has been authorized for detection and/or diagnosis of SARS-CoV-2 by FDA under an Emergency Use Authorization (EUA). This EUA will remain  in effect (meaning this test can be used) for the duration of the COVID-19 declaration under Section 564(b)(1) of the Act, 21 U.S.C.section 360bbb-3(b)(1), unless the authorization is terminated  or revoked sooner.       Influenza A by PCR NEGATIVE NEGATIVE Final   Influenza B by PCR NEGATIVE NEGATIVE Final    Comment: (NOTE) The Xpert Xpress SARS-CoV-2/FLU/RSV plus assay is intended as an aid in the diagnosis of influenza from Nasopharyngeal swab specimens and should not be used as a sole basis for treatment. Nasal washings and aspirates are unacceptable for Xpert Xpress SARS-CoV-2/FLU/RSV testing.  Fact Sheet for Patients: EntrepreneurPulse.com.au  Fact Sheet for Healthcare Providers: IncredibleEmployment.be  This test is not yet approved or cleared by the [place] FDA and has been authorized for detection and/or diagnosis of SARS-CoV-2 by FDA under an Emergency Use Authorization (EUA). This EUA will remain in effect (meaning this test can be used) for the duration of the COVID-19 declaration under Section 564(b)(1) of the Act, 21 U.S.C. section 360bbb-3(b)(1), unless the authorization is terminated or revoked.  Performed at [facility], [address], [place], [place] [address]   Urine culture     Status: Abnormal   Collection Time: 10/20/20 12:52 PM   Specimen: Urine, Random  Result Value Ref Range Status   Specimen Description   Final    URINE, RANDOM Performed at [facility], [address] [place], [place]  [address]    Special Requests   Final    NONE Performed at [facility], [address] [place], [place] [address]    Culture MULTIPLE SPECIES PRESENT, SUGGEST RECOLLECTION (A)  Final   Report Status 10/21/2020 FINAL  Final         Radiology Studies: No results found.      Scheduled Meds: . amLODipine  5 mg Oral Daily  . chlorhexidine  15 mL Mouth Rinse BID  . Chlorhexidine Gluconate Cloth  6 each Topical Daily  . feeding supplement  237 mL Oral BID BM  . fluticasone  1 spray Each Nare Daily  . heparin  5,000 Units Subcutaneous Q8H  . magic mouthwash  5 mL Oral TID  . mouth rinse  15 mL Mouth Rinse q12n4p  . pantoprazole sodium  20 mg Oral QAC breakfast   Continuous Infusions: . sodium chloride 250 mL (10/20/20 1819)  .  lactated ringers 100 mL/hr at 10/25/20 0526  . piperacillin-tazobactam (ZOSYN)  IV 3.375 g (10/25/20 0915)     LOS: 4 days    Time spent: 35 minutes    Irine Seal, MD [facility]   To contact the attending provider between 7A-7P or the covering provider during after hours 7P-7A, please log into the web site [email or web address] and access using universal [place] password for that web site. If you do not have the password, please call the hospital operator.  10/25/2020, 12:43 PM

## 2020-10-25 NOTE — Progress Notes (Signed)
Occupational Therapy Treatment Patient Details Name: Kaitlin Wagner MRN: [id number] DOB: [birth year] Today's Date: 10/25/2020    History of present illness Kaitlin Wagner is a 59yoF who comes to [facility] on 4/22 via transport van from [facility] facility due to thrush, diarrhea, poor PO intake, AMS. PMH: sacral wound, dementia, HTN, HLD, DM2, SOB, TIA, OSA. Pt is a [facility] participant. Pt has had indwelling foley sicne 4/15 to facilitate scaral wound healing. Pt has been at [facility] for assistance in sacral ulcer healing for ~6 months. Here surgery consulted for "small bowel pneumatosis and free air," aiming for conservative management first, as she would not tolerate extensive surgical procedures. Recent baseline has only included a few weeks of ambulatory status due to falls anxiety, dementia, and pain inhibition from wound. Pt uses a tilt n space WC to go from [facility] to [facility] for therapies 5x/week.   OT comments  Pt seen for OT treatment this date to f/u re: safety with ADLs/ADL mobility. Pt requires MAX/TOTAL A with bed mobility. Pt requires MAX A for UB ADLs at bed level, requires MAX A/TOTAL A for LB dressing/bathing using lateral rolling technique. Pt fearful with coming to sitting with MAX/TOTAL A and requires at least MOD A to sustain static sitting balance. Pt left in bed with pure-wick replaced and sheets replaced. Pt left with all needs met and in reach.  Continue to recommend STR f/u in SNF setting.     SNF    Equipment Recommendations  Other (comment) (defer)    Recommendations for Other Services      Precautions / Restrictions Precautions Precautions: Fall Precaution Comments: Sacral wound Restrictions Weight Bearing Restrictions: No       Mobility Bed Mobility Overal bed mobility: Needs Assistance Bed Mobility: Rolling;Supine to Sit;Sit to Supine Rolling: Max assist   Supine to sit: Max assist;Total assist Sit to supine: Max assist;Total assist   General bed mobility comments: increased  time, encouragement, effort, cues to sequence    Transfers                 General transfer comment: deferred, pt fearful with even simple EOB static sitting    Balance Overall balance assessment: Needs assistance Sitting-balance support: Bilateral upper extremity supported Sitting balance-Leahy Scale: Poor Sitting balance - Comments: requires MOD/MAX A for static sitting balance, will not plant her feet on the ground, citing fear of falling.                                   ADL either performed or assessed with clinical judgement   ADL Overall ADL's : Needs assistance/impaired     Grooming: Wash/dry face;Oral care;Set up;Moderate assistance;Bed level Grooming Details (indicate cue type and reason): HOB elevated Upper Body Bathing: Maximal assistance;Bed level Upper Body Bathing Details (indicate cue type and reason): HOB elevated Lower Body Bathing: Bed level;Total assistance Lower Body Bathing Details (indicate cue type and reason): using lateral rolling technique Upper Body Dressing : Bed level;Maximal assistance           Toileting- Clothing Manipulation and Hygiene: Maximal assistance;Total assistance;Bed level Toileting - Clothing Manipulation Details (indicate cue type and reason): rolling in bed             Vision Patient Visual Report: No change from baseline     Perception     Praxis      Cognition Arousal/Alertness: Awake/alert Behavior During Therapy: Flat affect Overall  Cognitive Status: History of cognitive impairments - at baseline                                 General Comments: patient able to follow commands with increased time        Exercises Other Exercises Other Exercises: OT facilitates ed re: importance of OOB activity. In addition, engaged pt in bathing/dressing tasks.   Shoulder Instructions       General Comments      Pertinent Vitals/ Pain       Pain Assessment: Faces Faces Pain  Scale: Hurts little more Pain Location: patient reports buttock pain but unable to rate intensity level (with rolling) Pain Descriptors / Indicators: Discomfort;Grimacing Pain Intervention(s): Limited activity within patient's tolerance;Monitored during session;Repositioned  Home Living                                          Prior Functioning/Environment              Frequency  Min 1X/week        Progress Toward Goals  OT Goals(current goals can now be found in the care plan section)  Progress towards OT goals: Progressing toward goals  Acute Rehab OT Goals Patient Stated Goal: none stated OT Goal Formulation: With patient Time For Goal Achievement: 11/07/20 Potential to Achieve Goals: Good  Plan Discharge plan remains appropriate    Co-evaluation                 AM-PAC OT "6 Clicks" Daily Activity     Outcome Measure   Help from another person eating meals?: A Little Help from another person taking care of personal grooming?: A Lot Help from another person toileting, which includes using toliet, bedpan, or urinal?: Total Help from another person bathing (including washing, rinsing, drying)?: A Lot Help from another person to put on and taking off regular upper body clothing?: A Lot Help from another person to put on and taking off regular lower body clothing?: Total 6 Click Score: 11    End of Session    OT Visit Diagnosis: Other abnormalities of gait and mobility (R26.89);Other symptoms and signs involving cognitive function;Muscle weakness (generalized) (M62.81)   Activity Tolerance Patient tolerated treatment well   Patient Left in bed;with call bell/phone within reach;with bed alarm set   Nurse Communication Mobility status        Time: 9147-8295 OT Time Calculation (min): 23 min  Charges: OT General Charges $OT Visit: 1 Visit OT Treatments $Self Care/Home Management : 8-22 mins $Therapeutic Activity: 8-22  mins  Gerrianne Scale, [place], OTR/L ascom [phone] 10/25/20, 4:41 PM

## 2020-10-26 LAB — BASIC METABOLIC PANEL
Anion gap: 9 (ref 5–15)
BUN: 22 mg/dL (ref 8–23)
CO2: 26 mmol/L (ref 22–32)
Calcium: 8.8 mg/dL — ABNORMAL LOW (ref 8.9–10.3)
Chloride: 107 mmol/L (ref 98–111)
Creatinine, Ser: 1.54 mg/dL — ABNORMAL HIGH (ref 0.44–1.00)
GFR, Estimated: 35 mL/min — ABNORMAL LOW (ref >60)
Glucose, Bld: 75 mg/dL (ref 70–99)
Potassium: 3.4 mmol/L — ABNORMAL LOW (ref 3.5–5.1)
Sodium: 142 mmol/L (ref 135–145)

## 2020-10-26 LAB — CBC WITH DIFFERENTIAL/PLATELET
Abs Immature Granulocytes: 0.13 10*3/uL — ABNORMAL HIGH (ref 0.00–0.07)
Basophils Absolute: 0 10*3/uL (ref 0.0–0.1)
Basophils Relative: 0 %
Eosinophils Absolute: 0.2 10*3/uL (ref 0.0–0.5)
Eosinophils Relative: 2 %
HCT: 36 % (ref 36.0–46.0)
Hemoglobin: 11.9 g/dL — ABNORMAL LOW (ref 12.0–15.0)
Immature Granulocytes: 1 %
Lymphocytes Relative: 18 %
Lymphs Abs: 1.9 10*3/uL (ref 0.7–4.0)
MCH: 28.5 pg (ref 26.0–34.0)
MCHC: 33.1 g/dL (ref 30.0–36.0)
MCV: 86.1 fL (ref 80.0–100.0)
Monocytes Absolute: 0.8 10*3/uL (ref 0.1–1.0)
Monocytes Relative: 8 %
Neutro Abs: 7.7 10*3/uL (ref 1.7–7.7)
Neutrophils Relative %: 71 %
Platelets: 236 10*3/uL (ref 150–400)
RBC: 4.18 MIL/uL (ref 3.87–5.11)
RDW: 16.6 % — ABNORMAL HIGH (ref 11.5–15.5)
WBC: 10.8 10*3/uL — ABNORMAL HIGH (ref 4.0–10.5)
nRBC: 0 % (ref 0.0–0.2)

## 2020-10-26 LAB — PHOSPHORUS: Phosphorus: 2.3 mg/dL — ABNORMAL LOW (ref 2.5–4.6)

## 2020-10-26 LAB — MAGNESIUM: Magnesium: 1.8 mg/dL (ref 1.7–2.4)

## 2020-10-26 MED ORDER — POTASSIUM PHOSPHATES 15 MMOLE/5ML IV SOLN
30.0000 mmol | Freq: Once | INTRAVENOUS | Status: DC
  Filled 2020-10-26: qty 10

## 2020-10-26 MED ORDER — ASPIRIN EC 81 MG PO TBEC
81.0000 mg | DELAYED_RELEASE_TABLET | Freq: Every day | ORAL | Status: DC
  Administered 2020-10-26 – 2020-10-27 (×2): 81 mg via ORAL
  Filled 2020-10-26 (×2): qty 1

## 2020-10-26 MED ORDER — LACTATED RINGERS IV SOLN: INTRAVENOUS | Status: AC

## 2020-10-26 MED ORDER — K PHOS MONO-SOD PHOS DI & MONO 155-852-130 MG PO TABS
500.0000 mg | ORAL_TABLET | ORAL | Status: AC
  Administered 2020-10-26 – 2020-10-27 (×3): 500 mg via ORAL
  Filled 2020-10-26 (×3): qty 2

## 2020-10-26 NOTE — Progress Notes (Signed)
Physical Therapy Treatment Patient Details Name: Kaitlin Wagner MRN: [id number] DOB: Mar 07, 1946 Today's Date: 10/26/2020    History of Present Illness Monika Normal is a 60yoF who comes to [facility] on 4/22 via transport van from [facility] facility due to thrush, diarrhea, poor PO intake, AMS. PMH: sacral wound, dementia, HTN, HLD, DM2, SOB, TIA, OSA. Pt is a [facility] participant. Pt has had indwelling foley sicne 4/15 to facilitate scaral wound healing. Pt has been at [facility] for assistance in sacral ulcer healing for ~6 months. Here surgery consulted for "small bowel pneumatosis and free air," aiming for conservative management first, as she would not tolerate extensive surgical procedures. Recent baseline has only included a few weeks of ambulatory status due to falls anxiety, dementia, and pain inhibition from wound. Pt uses a tilt n space WC to go from [facility] to [facility] for therapies 5x/week.    PT Comments    Pt in bed watching Price is Right, appears comfortable. Pt interactive minimally, calm, agreeable to session. Pt guided through bed level exercises of mostly legs, some arms. Pt has little drive to attempt difficulty efforts, particularly with attempts to engage trunk based movements which are fairly weak. Pt has only partial ROM tolerated in bilat legs at knees, ankles, and hips, however tolerates moderate manual resistance in mid ranges. Pt voices opposition when pain interferes with exercise. Pt denies cues to attempt bed mobility this date. Heels elevated at end of session, educated patient on prevention of heel ulcers, however she likely will be unable to attend to this given chronic cognitive impairment.    Follow Up Recommendations  SNF;Supervision for mobility/OOB;Supervision - Intermittent     Equipment Recommendations  None recommended by PT    Recommendations for Other Services       Precautions / Restrictions Precautions Precautions: Fall Precaution Comments: Sacral wound Restrictions Weight  Bearing Restrictions: No    Mobility  Bed Mobility Overal bed mobility:  (deferred; pt uninterested, not agreeable.)                  Transfers                    Ambulation/Gait                 Stairs             Wheelchair Mobility    Modified Rankin (Stroke Patients Only)       Balance                                            Cognition Arousal/Alertness: Awake/alert Behavior During Therapy: Flat affect Overall Cognitive Status: History of cognitive impairments - at baseline                                 General Comments: patient able to follow commands with increased time ~50% of time or less      Exercises Other Exercises Other Exercises: SAQ 2x15 bilat, SLR 2x15 bilat; pillow squeez 1x15; heel slides 1x15 bilat; 1x15 resisted knee/hip extension; 1x15 resisted ankle PF; 1x10 hand to head bilat; 1x15 manually resisted bilat clamshells.    General Comments        Pertinent Vitals/Pain Pain Assessment: No/denies pain    Home Living  Prior Function            PT Goals (current goals can now be found in the care plan section) Acute Rehab PT Goals Patient Stated Goal: none stated PT Goal Formulation: Patient unable to participate in goal setting Time For Goal Achievement: 11/06/20    Frequency    Min 2X/week      PT Plan Current plan remains appropriate    Co-evaluation              AM-PAC PT "6 Clicks" Mobility   Outcome Measure  Help needed turning from your back to your side while in a flat bed without using bedrails?: Total Help needed moving from lying on your back to sitting on the side of a flat bed without using bedrails?: Total Help needed moving to and from a bed to a chair (including a wheelchair)?: Total Help needed standing up from a chair using your arms (e.g., wheelchair or bedside chair)?: Total Help needed to walk in hospital  room?: Total Help needed climbing 3-5 steps with a railing? : Total 6 Click Score: 6    End of Session   Activity Tolerance: No increased pain;Patient tolerated treatment well Patient left: in bed;with call bell/phone within reach;with bed alarm set Nurse Communication: Mobility status PT Visit Diagnosis: Muscle weakness (generalized) (M62.81);Adult, failure to thrive (R62.7)     Time: 1117-1130 PT Time Calculation (min) (ACUTE ONLY): 13 min  Charges:  $Therapeutic Exercise: 8-22 mins                     12:23 PM, 10/26/20 Etta Grandchild, PT, DPT Physical Therapist - [facility]  [phone] ([place])     [place] C 10/26/2020, 12:20 PM

## 2020-10-26 NOTE — Progress Notes (Signed)
PROGRESS NOTE    Kaitlin Wagner  ZOX:[id number] DOB: Feb 10, 1946 DOA: 10/20/2020 PCP: [place]    Chief Complaint  Patient presents with  . Altered Mental Status  . Abnormal Lab    Brief Narrative:  Kaitlin Poteat Normanis a 75 y.o.femalewith medical history significant foradvance dementia, weight lost, Barretts esophagus,sacral decubitus ulcer, presents to the emergency department for chief concerns of altered mentation, her work-up was significant for tented small bowels, with concern of pneumatosis and free air, she was seen by general surgery, she is poor surgical candidate, current plan to continue with conservative management close monitoring, and plan for surgical intervention if she deteriorates.   Assessment & Plan:   Principal Problem:   AKI (acute kidney injury) ([place]) Active Problems:   Advanced dementia ([place])   GERD (gastroesophageal reflux disease)   Barrett esophagus   Sacral wound, initial encounter   Pneumatosis intestinalis of small intestine   Pneumoperitoneum   Pressure injury of skin   Pneumatosis of intestines   Sepsis ([place])   Oral thrush   Hypertension   Hypokalemia   Depression  1 sepsis due to small bowel pneumatosis and UTI -Patient came in with a leukocytosis, acute kidney injury, hypothermia, source noted as the urine, small bowel pneumatosis. -Patient seen in consultation by general surgery and patient deemed a poor surgical candidate given her large ventral hernia. -Clinical improvement.  -Patient was started on clear liquid diet and diet advanced and currently on a soft diet for breakfast this morning which she seems to be tolerating.  -WBC improved, abdomen soft, distended, abdominal x-ray with no further evidence of pneumatosis. -General surgery recommending conservative management and advancing diet as tolerated to a soft diet -Patient started on a soft diet for breakfast which she seems to be tolerating.  -Continue IV  antibiotics of Zosyn would likely not require any further antibiotics on discharge.  -General surgery following and have signed off as of 10/25/2020.    2.  Acute kidney injury -Felt secondary to prerenal azotemia in the setting of poor oral intake in the setting of oral thrush. -Creatinine on presentation was 3.49. - Baseline creatinine from PCPs notes was 0.73 (08/22/2020), 1.01(05/03/2020), 1.57(04/28/2020) -Renal function improving with gentle hydration and creatinine currently at 1.54 today.  -Gentle hydration.  Follow.    3.  Oral thrush -Status post Diflucan.   -Magic mouthwash.    4.  Depression/anxiety -Was noted to have been on venlafaxine 25 mg twice daily however noted to have been discontinued by PCP. -Outpatient follow-up.  5.  Hypertension -Controlled on current regimen of Norvasc.    6.  Hyperlipidemia -No longer on a statin.  Outpatient follow-up with PCP.  7.  Hypokalemia/hypophosphatemia -Potassium currently at 3.4, phosphorus at 2.3, magnesium of 1.8.  Replete.    8.  Presence of indwelling Foley catheter -It was noted per geriatrician note, Foley was placed 10/13/2020 for assistance with wound healing. -Foley catheter discontinued during this admission and patient currently on pure wick. -Dr. Waldron Labs discussed with patient's PCP, Dr. Wilford Grist, and decision was made to avoid reinserting Foley catheter given wound has significantly improved.  9.  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 Subcutaneous fat may be visible but bone, tendon or muscle are NOT exposed. reddened (Active)  10/20/20 1800  Location: Sacrum  Location Orientation: Posterior;Mid  Staging: Stage 3 -  Full thickness tissue loss. Subcutaneous fat may be  visible but bone, tendon or muscle are NOT exposed.  Wound Description (Comments): reddened  Present on Admission: Yes        DVT prophylaxis:  Heparin Code Status: DNR Family Communication: Updated PCP: Dr. Wilford Grist on the telephone Disposition:   Status is: Inpatient    Dispo: The patient is from: SNF              Anticipated d/c is to: Back to SNF hopefully tomorrow.              Patient currently advanced to a soft diet, on IV antibiotics, not stable for discharge.    Difficult to place patient undetermined       Consultants:   General surgery: Dr. Hampton Abbot 10/20/2020  Wound care: Phineas Douglas, RN 10/23/2020  Procedures:   CT abdomen and pelvis 10/20/2020  Abdominal films 10/22/2020  Chest x-ray 10/20/2020  Antimicrobials:   Diflucan  IV Zosyn 10/20/2020>>>>    Subjective: In bed.  No chest pain.  No shortness of breath.  Denies any abdominal pain.  Tolerated full liquids yesterday.  Stated had some eggs for breakfast and bacon which she tolerated.  No nausea or emesis.    Objective: Vitals:   10/26/20 0356 10/26/20 0740 10/26/20 1135 10/26/20 1719  BP: 140/85 (!) 147/70 134/66 130/85  Pulse: 71 (!) 56 78 74  Resp: 16 17 17 17   Temp: (!) 97.4 F (36.3 C) 98.4 F (36.9 C) 97.8 F (36.6 C) 98.5 F (36.9 C)  TempSrc:  Oral    SpO2: 99% 100% 100% 100%  Weight:      Height:        Intake/Output Summary (Last 24 hours) at 10/26/2020 1747 Last data filed at 10/26/2020 1500 Gross per 24 hour  Intake 1079.65 ml  Output 450 ml  Net 629.65 ml   Filed Weights   10/20/20 1222  Weight: 72.6 kg    Examination:  General exam: NAD Respiratory system: CTA B.  No wheezes, no crackles, no rhonchi.  Normal respiratory effort.   Cardiovascular system: Regular rate and rhythm.  No murmurs rubs or gallops.  No JVD.  No lower extremity edema.   Gastrointestinal system: Abdomen is soft, distended, ventral hernia, nontender to palpation, positive bowel sounds.  No rebound.  No guarding.   Central nervous system: Alert. No focal neurological deficits. Extremities: Symmetric 5 x 5 power. Skin: No rashes, lesions or  ulcers Psychiatry: Judgement and insight appear fair. Mood & affect appropriate.     Data Reviewed: I have personally reviewed following labs and imaging studies  CBC: Recent Labs  Lab 10/22/20 0432 10/23/20 0527 10/24/20 0504 10/25/20 0358 10/26/20 0502  WBC 7.2 10.1 12.4* 10.6* 10.8*  NEUTROABS  --   --   --   --  7.7  HGB 10.9* 11.7* 11.6* 11.7* 11.9*  HCT 31.5* 34.7* 34.2* 35.8* 36.0  MCV 82.0 84.2 83.6 85.9 86.1  PLT 300 308 270 250 [id number]    Basic Metabolic Panel: Recent Labs  Lab 10/22/20 0432 10/23/20 0527 10/24/20 0504 10/25/20 0358 10/26/20 0502  NA 143 145 145 142 142  K 3.2* 3.6 3.4* 3.7 3.4*  CL 116* 114* 114* 108 107  CO2 19* 22 23 23 26   GLUCOSE 80 75 80 71 75  BUN 88* 71* 48* 32* 22  CREATININE 2.63* 2.40* 2.00* 1.67* 1.54*  CALCIUM 9.0 9.1 9.0 8.8* 8.8*  MG 2.2 2.2 1.7 2.2 1.8  PHOS 3.4 2.8 2.0* 2.7 2.3*  GFR: Estimated Creatinine Clearance: 28.3 mL/min (A) (by C-G formula based on SCr of 1.54 mg/dL (H)).  Liver Function Tests: Recent Labs  Lab 10/20/20 1356  AST 16  ALT 23  ALKPHOS 118  BILITOT 1.0  PROT 9.2*  ALBUMIN 4.4    CBG: Recent Labs  Lab 10/20/20 1259 10/22/20 0755  GLUCAP 117* 79     Recent Results (from the past 240 hour(s))  Resp Panel by RT-PCR (Flu A&B, Covid) Nasopharyngeal Swab     Status: None   Collection Time: 10/20/20 12:52 PM   Specimen: Nasopharyngeal Swab; Nasopharyngeal(NP) swabs in vial transport medium  Result Value Ref Range Status   SARS Coronavirus 2 by RT PCR NEGATIVE NEGATIVE Final    Comment: (NOTE) SARS-CoV-2 target nucleic acids are NOT DETECTED.  The SARS-CoV-2 RNA is generally detectable in upper respiratory specimens during the acute phase of infection. The lowest concentration of SARS-CoV-2 viral copies this assay can detect is 138 copies/mL. A negative result does not preclude SARS-Cov-2 infection and should not be used as the sole basis for treatment or other patient management  decisions. A negative result may occur with  improper specimen collection/handling, submission of specimen other than nasopharyngeal swab, presence of viral mutation(s) within the areas targeted by this assay, and inadequate number of viral copies(<138 copies/mL). A negative result must be combined with clinical observations, patient history, and epidemiological information. The expected result is Negative.  Fact Sheet for Patients:  EntrepreneurPulse.com.au  Fact Sheet for Healthcare Providers:  IncredibleEmployment.be  This test is no t yet approved or cleared by the [place] FDA and  has been authorized for detection and/or diagnosis of SARS-CoV-2 by FDA under an Emergency Use Authorization (EUA). This EUA will remain  in effect (meaning this test can be used) for the duration of the COVID-19 declaration under Section 564(b)(1) of the Act, 21 U.S.C.section 360bbb-3(b)(1), unless the authorization is terminated  or revoked sooner.       Influenza A by PCR NEGATIVE NEGATIVE Final   Influenza B by PCR NEGATIVE NEGATIVE Final    Comment: (NOTE) The Xpert Xpress SARS-CoV-2/FLU/RSV plus assay is intended as an aid in the diagnosis of influenza from Nasopharyngeal swab specimens and should not be used as a sole basis for treatment. Nasal washings and aspirates are unacceptable for Xpert Xpress SARS-CoV-2/FLU/RSV testing.  Fact Sheet for Patients: EntrepreneurPulse.com.au  Fact Sheet for Healthcare Providers: IncredibleEmployment.be  This test is not yet approved or cleared by the [place] FDA and has been authorized for detection and/or diagnosis of SARS-CoV-2 by FDA under an Emergency Use Authorization (EUA). This EUA will remain in effect (meaning this test can be used) for the duration of the COVID-19 declaration under Section 564(b)(1) of the Act, 21 U.S.C. section 360bbb-3(b)(1), unless the  authorization is terminated or revoked.  Performed at [facility], [address], [place], [place] [address]   Urine culture     Status: Abnormal   Collection Time: 10/20/20 12:52 PM   Specimen: Urine, Random  Result Value Ref Range Status   Specimen Description   Final    URINE, RANDOM Performed at [facility], [address], [place], [place] [address]    Special Requests   Final    NONE Performed at [facility], [address] [place], [place] [address]    Culture MULTIPLE SPECIES PRESENT, SUGGEST RECOLLECTION (A)  Final   Report Status 10/21/2020 FINAL  Final  Radiology Studies: No results found.      Scheduled Meds: . amLODipine  5 mg Oral Daily  . aspirin EC  81 mg Oral Daily  . chlorhexidine  15 mL Mouth Rinse BID  . Chlorhexidine Gluconate Cloth  6 each Topical Daily  . feeding supplement  237 mL Oral BID BM  . fluticasone  1 spray Each Nare Daily  . heparin  5,000 Units Subcutaneous Q8H  . magic mouthwash  5 mL Oral TID  . mouth rinse  15 mL Mouth Rinse q12n4p  . pantoprazole sodium  20 mg Oral QAC breakfast  . phosphorus  500 mg Oral Q4H   Continuous Infusions: . sodium chloride 250 mL (10/20/20 1819)  . piperacillin-tazobactam (ZOSYN)  IV 3.375 g (10/26/20 0959)     LOS: 5 days    Time spent: 35 minutes    Irine Seal, MD [facility]   To contact the attending provider between 7A-7P or the covering provider during after hours 7P-7A, please log into the web site [email or web address] and access using universal [place] password for that web site. If you do not have the password, please call the hospital operator.  10/26/2020, 5:47 PM

## 2020-10-27 DIAGNOSIS — R6520 Severe sepsis without septic shock: Secondary | ICD-10-CM

## 2020-10-27 DIAGNOSIS — K219 Gastro-esophageal reflux disease without esophagitis: Secondary | ICD-10-CM

## 2020-10-27 LAB — BASIC METABOLIC PANEL
Anion gap: 8 (ref 5–15)
BUN: 15 mg/dL (ref 8–23)
CO2: 28 mmol/L (ref 22–32)
Calcium: 8.5 mg/dL — ABNORMAL LOW (ref 8.9–10.3)
Chloride: 105 mmol/L (ref 98–111)
Creatinine, Ser: 1.59 mg/dL — ABNORMAL HIGH (ref 0.44–1.00)
GFR, Estimated: 33 mL/min — ABNORMAL LOW (ref >60)
Glucose, Bld: 75 mg/dL (ref 70–99)
Potassium: 3.3 mmol/L — ABNORMAL LOW (ref 3.5–5.1)
Sodium: 141 mmol/L (ref 135–145)

## 2020-10-27 LAB — PHOSPHORUS: Phosphorus: 2.9 mg/dL (ref 2.5–4.6)

## 2020-10-27 LAB — MAGNESIUM: Magnesium: 1.6 mg/dL — ABNORMAL LOW (ref 1.7–2.4)

## 2020-10-27 LAB — SARS CORONAVIRUS 2 (TAT 6-24 HRS): SARS Coronavirus 2: NEGATIVE

## 2020-10-27 MED ORDER — POTASSIUM CHLORIDE 20 MEQ PO PACK
40.0000 meq | PACK | Freq: Once | ORAL | Status: AC
  Administered 2020-10-27: 12:00:00 40 meq via ORAL
  Filled 2020-10-27: qty 2

## 2020-10-27 MED ORDER — ENSURE ENLIVE PO LIQD: 237.0000 mL | Freq: Two times a day (BID) | ORAL | 0 refills | Status: DC

## 2020-10-27 MED ORDER — MAGIC MOUTHWASH: 5.0000 mL | Freq: Three times a day (TID) | ORAL | 0 refills | Status: AC

## 2020-10-27 MED ORDER — AMLODIPINE BESYLATE 5 MG PO TABS: 5.0000 mg | ORAL_TABLET | Freq: Every day | ORAL | 0 refills | Status: DC

## 2020-10-27 MED ORDER — MAGNESIUM SULFATE 4 GM/100ML IV SOLN
4.0000 g | Freq: Once | INTRAVENOUS | Status: AC
  Administered 2020-10-27: 11:00:00 4 g via INTRAVENOUS
  Filled 2020-10-27: qty 100

## 2020-10-27 NOTE — Discharge Summary (Signed)
Physician Discharge Summary  Kaitlin Wagner QAS:[id number] DOB: 1945/07/10 DOA: 10/20/2020  PCP: [facility] date: 10/20/2020 Discharge date: 10/27/2020  Time spent: 55 minutes  Recommendations for Outpatient Follow-up:  1. Follow-up with [facility] in 1 week.  On follow-up patient will need a renal panel, magnesium done to follow-up on electrolytes and renal function.  Patient need a CBC done to follow-up on counts.   Discharge Diagnoses:  Principal Problem:   AKI (acute kidney injury) ([place]) Active Problems:   Advanced dementia ([place])   GERD (gastroesophageal reflux disease)   Barrett esophagus   Sacral wound, initial encounter   Pneumatosis intestinalis of small intestine   Pneumoperitoneum   Pressure injury of skin   Pneumatosis of intestines   Sepsis ([place])   Oral thrush   Hypertension   Hypokalemia   Depression   Discharge Condition: Stable and improved  Diet recommendation: Dysphagia 3 diet.  Filed Weights   10/20/20 1222  Weight: 72.6 kg    History of present illness:  HPI per Dr. Tobie Poet. Heavyn Wagner is a 75 y.o. female with medical history significant for advance dementia, weight lost, Barretts esophagus, sacral decubitus ulcer, presents to the emergency department for chief concerns of altered mentation.  At bedside, she is able to tell me her full name. She was not able to tell me her age (attempted 53 and then 29), current year, current location, current president of the [place].  On physical exam patient states she is very cold and wants to be comfortable.  With abdominal light palpation she tries to push my hand away and states that she is cold and wants to be covered up again.  Per facility, patient has had poor Po intake since Saturday 10/14/20.   Social history: lives at full time facility.  Vaccinations: Patient states she is vaccinated for COVID-19  ROS: Unable to complete as patient has advanced  dementia Constitutional: no weight change, no fever ENT/Mouth: no sore throat, no rhinorrhea Eyes: no eye pain, no vision changes Cardiovascular: no chest pain, no dyspnea, no edema, no palpitations Respiratory: no cough, no sputum, no wheezing Gastrointestinal: no nausea, no vomiting, no diarrhea, no constipation Genitourinary: no urinary incontinence, no dysuria, no hematuria Musculoskeletal: no arthralgias, no myalgias Skin: no skin lesions, no pruritus, Neuro:+weakness, no loss of consciousness, no syncope Psych: no anxiety, no depression,+decrease appetite Heme/Lymph: no bruising, no bleeding  ED Course: Discussed with ED provider, patient requiring hospitalization due to acute kidney injury and meeting SIRS criteria.  Vitals in the emergency department was remarkable for temperature of 96.6, respiration rate of 27, blood pressure 127/71, heart rate 115, SPO2 at 99% on room air.  Labs in the emergency department was remarkable for sodium level of 137, potassium 4.1, chloride 107, bicarb 18, BUN 122, serum creatinine 3.49, nonfasting blood glucose 107, WBC 13.4, hemoglobin 14, platelets 392.  EGFR 13, COVID was negative, UA showed large leukocytes.  EDP ordered normal saline 1 L bolus and CT abdomen and pelvis without contrast.  I spoke with geriatrician, Dr. Wilford Grist regarding Kaitlin Wagner.   Hospital Course:  1 severe sepsis due to small bowel pneumatosis and UTI -Patient came in with a leukocytosis, acute kidney injury, hypothermia, source noted as the urine, small bowel pneumatosis. -Patient seen in consultation by general surgery and patient deemed a poor surgical candidate given her large ventral hernia. -Patient placed empirically on IV antibiotics of IV Zosyn. -Patient improved clinically during the hospitalization. -  Patient was started on clear liquid diet and diet advanced  to a dysphagia 3 diet which patient tolerated without any significant abdominal pain.   Patient also noted to be having bowel movements passing flatus.  -WBC improved, abdomen soft, distended, abdominal x-ray with no further evidence of pneumatosis. -General surgery recommended conservative management and advancing diet as tolerated to a soft diet -Patient received a total of 7 days of IV Zosyn/antibiotics during the hospitalization Further antibiotics on discharge.  -Outpatient follow-up with PCP.  2.  Acute kidney injury -Felt secondary to prerenal azotemia in the setting of poor oral intake in the setting of oral thrush. -Creatinine on presentation was 3.49. - Baseline creatinine from PCPs notes was 0.73 (08/22/2020), 1.01(05/03/2020), 1.57(04/28/2020) -Patient placed on IV fluids with improvement with renal function such that by day of discharge creatinine was down to 1.59.   -Outpatient follow-up with PCP.    3.  Oral thrush -Status post Diflucan.   -Patient placed on Magic mouthwash.   -Patient be discharged on 2-3 more days of oral Magic mouthwash.  4.  Depression/anxiety -Was noted to have been on venlafaxine 25 mg twice daily however noted to have been discontinued by PCP. -Outpatient follow-up.  5.  Hypertension -Patient placed on Norvasc 5 mg daily with better blood pressure control.  Outpatient follow-up with PCP may need up titration of antihypertensive medication.   6.  Hyperlipidemia -No longer on a statin.  Outpatient follow-up with PCP.  7.  Hypokalemia/hypophosphatemia -Repleted prior to discharge.  Outpatient follow-up.   8.  Presence of indwelling Foley catheter -It was noted per geriatrician note, Foley was placed 10/13/2020 for assistance with wound healing. -Foley catheter discontinued during this admission and patient currently on pure wick. -Dr. Waldron Labs discussed with patient's PCP, Dr. Wilford Grist, and decision was made to avoid reinserting Foley catheter given wound has significantly improved. -Outpatient follow-up with PCP.  9.  Stage  III pressure injury to the sacral area Pressure Injury 10/20/20 Sacrum Posterior;Mid Stage 3 -  Full thickness Wagner loss. Subcutaneous fat may be visible but bone, tendon or muscle are NOT exposed. reddened (Active)  10/20/20 1800  Location: Sacrum  Location Orientation: Posterior;Mid  Staging: Stage 3 -  Full thickness Wagner loss. Subcutaneous fat may be visible but bone, tendon or muscle are NOT exposed.  Wound Description (Comments): reddened  Present on Admission: Yes       Procedures:  CT abdomen and pelvis 10/20/2020  Abdominal films 10/22/2020  Chest x-ray 10/20/2020    Consultations:  General surgery: Dr. Hampton Abbot 10/20/2020  Wound care: Phineas Douglas, RN 10/23/2020    Discharge Exam: Vitals:   10/27/20 0900 10/27/20 1144  BP:  (!) 141/70  Pulse:  67  Resp:  16  Temp: 97.9 F (36.6 C) 98 F (36.7 C)  SpO2:  100%    General: NAD Cardiovascular: RRR no murmurs rubs or gallops. Respiratory: Clear to auscultation bilaterally anterior lung fields GI: Abdomen is soft, distended,  ventral hernia, nontender to palpation, positive bowel sounds.  No rebound.  No guarding.  Discharge Instructions   Discharge Instructions    Diet - low sodium heart healthy   Complete by: As directed    Dysphagia 3 diet   Discharge wound care:   Complete by: As directed    As above   Increase activity slowly   Complete by: As directed      Allergies as of 10/27/2020   No Known Allergies     Medication List  STOP taking these medications   amLODipine-atorvastatin 10-40 MG tablet Commonly known as: CADUET   ammonium lactate 12 % cream Commonly known as: AMLACTIN   atorvastatin 40 MG tablet Commonly known as: LIPITOR   Diflucan 100 MG tablet Generic drug: fluconazole   lisinopril-hydrochlorothiazide 20-12.5 MG tablet Commonly known as: ZESTORETIC   metoprolol succinate 25 MG 24 hr tablet Commonly known as: TOPROL-XL   pioglitazone 15 MG tablet Commonly  known as: ACTOS   venlafaxine 25 MG tablet Commonly known as: EFFEXOR     TAKE these medications   Acetaminophen Childrens 160 MG/5ML Soln Take 20 mLs by mouth 3 (three) times daily as needed for pain.   amLODipine 5 MG tablet Commonly known as: NORVASC Take 1 tablet (5 mg total) by mouth daily. Start taking on: October 28, 2020   aspirin EC 81 MG tablet Take 81 mg by mouth daily.   chlorhexidine 0.12 % solution Commonly known as: PERIDEX Use as directed 15 mLs in the mouth or throat daily.   Cholecalciferol 25 MCG (1000 UT) tablet Take 1,000 Units by mouth daily.   DULoxetine 30 MG capsule Commonly known as: CYMBALTA Take 30 mg by mouth daily.   esomeprazole 10 MG packet Commonly known as: NEXIUM Take 10 mg by mouth daily before breakfast.   feeding supplement Liqd Take 237 mLs by mouth 2 (two) times daily between meals.   fluticasone 50 MCG/ACT nasal spray Commonly known as: FLONASE Place 1 spray into both nostrils daily.   loperamide 2 MG capsule Commonly known as: IMODIUM Take by mouth 4 (four) times daily as needed for diarrhea or loose stools.   magic mouthwash Soln Take 5 mLs by mouth 3 (three) times daily for 3 days.   PrePLUS 27-1 MG Tabs Take 1 tablet by mouth daily.   triamcinolone 0.1 % cream : eucerin Crea Apply 1 application topically as needed.   zinc oxide 20 % ointment Apply 1 application topically as needed for irritation.            Discharge Care Instructions  (From admission, onward)         Start     Ordered   10/27/20 0000  Discharge wound care:       Comments: As above   10/27/20 1417         No Known Allergies  Follow-up Information    [facility]. Schedule an appointment as soon as possible for a visit in 1 week(s).   Contact information: [place] [address] [phone]                The results of significant diagnostics from this hospitalization (including imaging,  microbiology, ancillary and laboratory) are listed below for reference.    Significant Diagnostic Studies: CT ABDOMEN PELVIS WO CONTRAST  Result Date: 10/20/2020 CLINICAL DATA:  Abdominal distension. Nursing home patient with dementia and sacral decubitus wound. EXAM: CT ABDOMEN AND PELVIS WITHOUT CONTRAST TECHNIQUE: Multidetector CT imaging of the abdomen and pelvis was performed following the standard protocol without IV contrast. COMPARISON:  Most recent CT 07/30/2011 FINDINGS: Lower chest: Mild cardiomegaly. Coronary artery calcifications. Atherosclerosis and tortuosity of the included thoracic aorta. Subsegmental atelectasis in the lingula and left lower lobe. No pleural effusion or acute airspace disease. Hepatobiliary: Elongated right lobe of the liver. No evidence of focal liver abnormality on this noncontrast exam. Cholecystectomy. No obvious biliary dilatation, motion obscures detailed assessment. There is no portal venous gas. Pancreas: Parenchymal atrophy. No  ductal dilatation or inflammation. Spleen: Normal in size without focal abnormality. Adrenals/Urinary Tract: No adrenal nodule. 6 mm nonobstructing stone in the lower left kidney. 3 mm nonobstructing stone in the lower right kidney. There is no hydronephrosis. Probable cyst in the upper right kidney, series 2, image 26, obscured by motion. Minor symmetric perinephric edema. No ureteral calculi. Foley catheter decompresses the urinary bladder. Equivocal bladder wall thickening. Small amount of air in the bladder is likely related to catheter. Stomach/Bowel: Bowel assessment is limited in the absence of enteric contrast as well as patient motion artifact. Diffuse gaseous distension of small bowel loops. There are areas of small bowel pneumatosis as well as free air in the adjacent mid abdomen, detailed assessment obscured by motion, however for example series 2, image 45. Pneumatosis appears to involve a moderate segment of small bowel.  Occasional small air-fluid levels. Patient is post at least partial colectomy with sigmoid colon remaining in situ, and chain sutures noted in the left abdomen. Stool in the distal colon. Vascular/Lymphatic: Advanced aortic and branch atherosclerosis. No aortic aneurysm. There is no portal venous or mesenteric venous gas. Cannot assess for small-vessel mesenteric gas given motion. No bulky abdominopelvic adenopathy. Reproductive: Unenhanced uterus grossly normal.  No adnexal mass. Other: No significant ascites or free fluid. Small volume of free air suspected in the mid abdomen adjacent to small bowel pneumatosis, without tracking under the hemidiaphragms. Musculoskeletal: Skin defect consistent sacral decubitus ulcer extends to the sacrum and coccyx. There is no obvious bony resorption or destruction. No focal fluid collection. Laxity of anterior abdominal musculature. Bones are diffusely under mineralized with diffuse degenerative change throughout the spine. Gluteal and paravertebral muscle fatty atrophy. IMPRESSION: 1. Small bowel pneumatosis and free air in the mid abdomen, detailed assessment obscured by motion artifact. This is suspicious for bowel ischemia. No portal venous or mesenteric venous gas. 2. Skin defect consistent with sacral decubitus ulcer extends to the sacrum and coccyx. No obvious bony resorption or destruction. 3. Bilateral nonobstructing renal calculi. Aortic Atherosclerosis (ICD10-I70.0). These results were called by telephone at the time of interpretation on 10/20/2020 at 3:44 pm to Dr Ellender Hose, who verbally acknowledged these results. Electronically Signed   By: Keith Rake M.D.   On: 10/20/2020 15:44   DG Chest Port 1 View  Result Date: 10/20/2020 CLINICAL DATA:  [place] EXAM: PORTABLE CHEST 1 VIEW COMPARISON:  None. FINDINGS: The heart size and mediastinal contours are within normal limits. Both lungs are clear. The visualized skeletal structures are unremarkable. IMPRESSION: No  active disease. Electronically Signed   By: Miachel Roux M.D.   On: 10/20/2020 16:11   DG Abd 2 Views  Result Date: 10/22/2020 CLINICAL DATA:  History of Alzheimer's. EXAM: ABDOMEN - 2 VIEW COMPARISON:  None. FINDINGS: Visualized lung bases are normal. Small bowel dilatation remains in the central abdomen. The pneumatosis and free air seen on yesterday's CT scan are not as well visualized on this study. There is a small amount of probable extraluminal gas remaining in the right upper quadrant. There is a paucity of colonic gas. No other abnormalities. IMPRESSION: 1. The small bowel pneumatosis and free air seen on the CT scan from October 20, 2020 is not as well appreciated today. A small amount of probable extraluminal gas remains in the right upper quadrant. 2. Small bowel dilatation may be due to ileus or obstruction. Electronically Signed   By: Dorise Bullion III M.D   On: 10/22/2020 14:07    Microbiology: Recent Results (from  the past 240 hour(s))  Resp Panel by RT-PCR (Flu A&B, Covid) Nasopharyngeal Swab     Status: None   Collection Time: 10/20/20 12:52 PM   Specimen: Nasopharyngeal Swab; Nasopharyngeal(NP) swabs in vial transport medium  Result Value Ref Range Status   SARS Coronavirus 2 by RT PCR NEGATIVE NEGATIVE Final    Comment: (NOTE) SARS-CoV-2 target nucleic acids are NOT DETECTED.  The SARS-CoV-2 RNA is generally detectable in upper respiratory specimens during the acute phase of infection. The lowest concentration of SARS-CoV-2 viral copies this assay can detect is 138 copies/mL. A negative result does not preclude SARS-Cov-2 infection and should not be used as the sole basis for treatment or other patient management decisions. A negative result may occur with  improper specimen collection/handling, submission of specimen other than nasopharyngeal swab, presence of viral mutation(s) within the areas targeted by this assay, and inadequate number of viral copies(<138  copies/mL). A negative result must be combined with clinical observations, patient history, and epidemiological information. The expected result is Negative.  Fact Sheet for Patients:  EntrepreneurPulse.com.au  Fact Sheet for Healthcare Providers:  IncredibleEmployment.be  This test is no t yet approved or cleared by the [place] FDA and  has been authorized for detection and/or diagnosis of SARS-CoV-2 by FDA under an Emergency Use Authorization (EUA). This EUA will remain  in effect (meaning this test can be used) for the duration of the COVID-19 declaration under Section 564(b)(1) of the Act, 21 U.S.C.section 360bbb-3(b)(1), unless the authorization is terminated  or revoked sooner.       Influenza A by PCR NEGATIVE NEGATIVE Final   Influenza B by PCR NEGATIVE NEGATIVE Final    Comment: (NOTE) The Xpert Xpress SARS-CoV-2/FLU/RSV plus assay is intended as an aid in the diagnosis of influenza from Nasopharyngeal swab specimens and should not be used as a sole basis for treatment. Nasal washings and aspirates are unacceptable for Xpert Xpress SARS-CoV-2/FLU/RSV testing.  Fact Sheet for Patients: EntrepreneurPulse.com.au  Fact Sheet for Healthcare Providers: IncredibleEmployment.be  This test is not yet approved or cleared by the [place] FDA and has been authorized for detection and/or diagnosis of SARS-CoV-2 by FDA under an Emergency Use Authorization (EUA). This EUA will remain in effect (meaning this test can be used) for the duration of the COVID-19 declaration under Section 564(b)(1) of the Act, 21 U.S.C. section 360bbb-3(b)(1), unless the authorization is terminated or revoked.  Performed at [facility], [address], [place], [place] [address]   Urine culture     Status: Abnormal   Collection Time: 10/20/20 12:52 PM   Specimen: Urine, Random  Result Value Ref Range  Status   Specimen Description   Final    URINE, RANDOM Performed at [facility], [address], [place], [place] [address]    Special Requests   Final    NONE Performed at [facility], [address] [place], [place] [address]    Culture MULTIPLE SPECIES PRESENT, SUGGEST RECOLLECTION (A)  Final   Report Status 10/21/2020 FINAL  Final  SARS CORONAVIRUS 2 (TAT 6-24 HRS) Nasopharyngeal Nasopharyngeal Swab     Status: None   Collection Time: 10/26/20  3:51 PM   Specimen: Nasopharyngeal Swab  Result Value Ref Range Status   SARS Coronavirus 2 NEGATIVE NEGATIVE Final    Comment: (NOTE) SARS-CoV-2 target nucleic acids are NOT DETECTED.  The SARS-CoV-2 RNA is generally detectable in upper and lower respiratory specimens during the acute phase of infection. Negative results  do not preclude SARS-CoV-2 infection, do not rule out co-infections with other pathogens, and should not be used as the sole basis for treatment or other patient management decisions. Negative results must be combined with clinical observations, patient history, and epidemiological information. The expected result is Negative.  Fact Sheet for Patients: SugarRoll.be  Fact Sheet for Healthcare Providers: https://www.woods-mathews.com/  This test is not yet approved or cleared by the [place] FDA and  has been authorized for detection and/or diagnosis of SARS-CoV-2 by FDA under an Emergency Use Authorization (EUA). This EUA will remain  in effect (meaning this test can be used) for the duration of the COVID-19 declaration under Se ction 564(b)(1) of the Act, 21 U.S.C. section 360bbb-3(b)(1), unless the authorization is terminated or revoked sooner.  Performed at [facility], [place] [address], [place], [place] [address]      Labs: Basic Metabolic Panel: Recent Labs  Lab 10/23/20 0527 10/24/20 0504 10/25/20 0358 10/26/20 0502  10/27/20 0443  NA 145 145 142 142 141  K 3.6 3.4* 3.7 3.4* 3.3*  CL 114* 114* 108 107 105  CO2 22 23 23 26 28   GLUCOSE 75 80 71 75 75  BUN 71* 48* 32* 22 15  CREATININE 2.40* 2.00* 1.67* 1.54* 1.59*  CALCIUM 9.1 9.0 8.8* 8.8* 8.5*  MG 2.2 1.7 2.2 1.8 1.6*  PHOS 2.8 2.0* 2.7 2.3* 2.9   Liver Function Tests: No results for input(s): AST, ALT, ALKPHOS, BILITOT, PROT, ALBUMIN in the last 168 hours. No results for input(s): LIPASE, AMYLASE in the last 168 hours. No results for input(s): AMMONIA in the last 168 hours. CBC: Recent Labs  Lab 10/22/20 0432 10/23/20 0527 10/24/20 0504 10/25/20 0358 10/26/20 0502  WBC 7.2 10.1 12.4* 10.6* 10.8*  NEUTROABS  --   --   --   --  7.7  HGB 10.9* 11.7* 11.6* 11.7* 11.9*  HCT 31.5* 34.7* 34.2* 35.8* 36.0  MCV 82.0 84.2 83.6 85.9 86.1  PLT 300 308 270 250 236   Cardiac Enzymes: No results for input(s): CKTOTAL, CKMB, CKMBINDEX, TROPONINI in the last 168 hours. BNP: BNP (last 3 results) No results for input(s): BNP in the last 8760 hours.  ProBNP (last 3 results) No results for input(s): PROBNP in the last 8760 hours.  CBG: Recent Labs  Lab 10/22/20 0755  GLUCAP 79       Signed:  Irine Seal MD.  [facility] 10/27/2020, 2:18 PM

## 2020-10-27 NOTE — Progress Notes (Signed)
Occupational Therapy Treatment Patient Details Name: Kaitlin Wagner MRN: [id number] DOB: 1946-06-30 Today's Date: 10/27/2020    History of present illness Kaitlin Wagner is a 66yoF who comes to [facility] on 4/22 via transport van from [facility] facility due to thrush, diarrhea, poor PO intake, AMS. PMH: sacral wound, dementia, HTN, HLD, DM2, SOB, TIA, OSA. Pt is a [facility] participant. Pt has had indwelling foley sicne 4/15 to facilitate scaral wound healing. Pt has been at [facility] for assistance in sacral ulcer healing for ~6 months. Here surgery consulted for "small bowel pneumatosis and free air," aiming for conservative management first, as she would not tolerate extensive surgical procedures. Recent baseline has only included a few weeks of ambulatory status due to falls anxiety, dementia, and pain inhibition from wound. Pt uses a tilt n space WC to go from [facility] to [facility] for [facility] 5x/week.   OT comments  Pt seen for OT treatment today to f/u re: safety with ADLs. Pt requires cues to initiate and sequence basic self care tasks throughout. OT engages pt in grooming tasks such as oral care and applying deodorant at bed level with HOB elevated and pt requires MIN/MOD A showing some slight progression from last session with physical aspect of task, but still requiring increased cues. Pt able to participate in UB dressing with MIN/MOD A. Pt left with all needs met and in reach. Continue to progress with OT efforts. Will continue to follow.    Follow Up Recommendations  SNF    Equipment Recommendations  Other (comment) (defer)    Recommendations for Other Services      Precautions / Restrictions Precautions Precautions: Fall Precaution Comments: Sacral wound Restrictions Weight Bearing Restrictions: No       Mobility Bed Mobility                    Transfers                      Balance                                           ADL either performed or assessed  with clinical judgement   ADL Overall ADL's : Needs assistance/impaired     Grooming: Wash/dry face;Oral care;Set up;Bed level;Minimal assistance;Moderate assistance;Applying deodorant Grooming Details (indicate cue type and reason): HOB elevated, increased time and cues, hand over hand to help pt initiate task and consistent cueing as she starts and stops task repeatedly Upper Body Bathing: Moderate assistance;Bed level Upper Body Bathing Details (indicate cue type and reason): HOB elevated, cues to initiate and sequence     Upper Body Dressing : Moderate assistance;Bed level Upper Body Dressing Details (indicate cue type and reason): increased time and cues to intiaite as well as sequence throughout to doff dirty gown and don clean one. HOB elevated.                         Vision Baseline Vision/History: Wears glasses Wears Glasses: At all times Patient Visual Report: No change from baseline     Perception     Praxis      Cognition Arousal/Alertness: Awake/alert Behavior During Therapy: Flat affect Overall Cognitive Status: History of cognitive impairments - at baseline  General Comments: Pt requires initiation and sequencing cues throughout, follows simple one step commands ~80% of the time, but with very poor short term memory requring re-direct to attend to task after only meaningfully participating in ~10-15 second bouts.        Exercises Other Exercises Other Exercises: grooming, bathing and dressing tasks during OT session with assist and cues to sequence   Shoulder Instructions       General Comments      Pertinent Vitals/ Pain       Pain Assessment: No/denies pain Faces Pain Scale: Hurts little more Pain Location: patient reports buttock pain but unable to rate intensity level (with rolling) Pain Descriptors / Indicators: Discomfort;Grimacing Pain Intervention(s): Limited activity within patient's  tolerance;Monitored during session  Home Living                                          Prior Functioning/Environment              Frequency  Min 1X/week        Progress Toward Goals  OT Goals(current goals can now be found in the care plan section)  Progress towards OT goals: Progressing toward goals  Acute Rehab OT Goals Patient Stated Goal: none stated OT Goal Formulation: With patient Time For Goal Achievement: 11/07/20 Potential to Achieve Goals: Good  Plan Discharge plan remains appropriate    Co-evaluation                 AM-PAC OT "6 Clicks" Daily Activity     Outcome Measure   Help from another person eating meals?: A Little Help from another person taking care of personal grooming?: A Lot Help from another person toileting, which includes using toliet, bedpan, or urinal?: Total Help from another person bathing (including washing, rinsing, drying)?: A Lot Help from another person to put on and taking off regular upper body clothing?: A Lot Help from another person to put on and taking off regular lower body clothing?: Total 6 Click Score: 11    End of Session    OT Visit Diagnosis: Other abnormalities of gait and mobility (R26.89);Other symptoms and signs involving cognitive function;Muscle weakness (generalized) (M62.81)   Activity Tolerance Patient tolerated treatment well   Patient Left in bed;with call bell/phone within reach;with bed alarm set   Nurse Communication          Time: 0092-3300 OT Time Calculation (min): 9 min  Charges: OT General Charges $OT Visit: 1 Visit OT Treatments $Self Care/Home Management : 8-22 mins  Gerrianne Scale, MS, OTR/L ascom [phone] 10/27/20, 10:59 AM

## 2020-10-27 NOTE — TOC Transition Note (Signed)
Transition of Care [facility]) - CM/SW Discharge Note   Patient Details  Name: Kaitlin Wagner MRN: [id number] Date of Birth: [birth year]  Transition of Care [facility]) CM/SW Contact:  Shelbie Hutching, RN Phone Number: 10/27/2020, 2:39 PM   Clinical Narrative:    Patient is medically cleared for discharge to [facility] today.  [facility] will be picking the patient up at 1515.  [facility] will do a discharge assessment and then take the patient over to [facility].  Attempted to notify patient's daughter, left a message for that patient is discharging.     Final next level of care: [place] Barriers to Discharge: Barriers Resolved   Patient Goals and CMS Choice Patient states their goals for this hospitalization and ongoing recovery are:: [facility] patient- plan for return to [facility] [employer]gov Compare Post Acute Care list provided to:: Patient Represenative (must comment) Choice offered to / list presented to : Adult Children  Discharge Placement              Patient chooses bed at: [facility] Patient to be transferred to facility by: [facility] Name of family member notified: [facility] notified Patient and family notified of of transfer: 10/27/20  Discharge Plan and Services   Discharge Planning Services: CM Consult Post Acute Care Choice: [place]          DME Arranged: N/A DME Agency: NA       HH Arranged: NA [place] Agency: NA        Social Determinants of Health (SDOH) Interventions     Readmission Risk Interventions No flowsheet data found.

## 2020-10-27 NOTE — TOC Progression Note (Signed)
Transition of Care [facility]) - Progression Note    Patient Details  Name: Kaitlin Wagner MRN: [id number] Date of Birth: 02/03/1946  Transition of Care [facility]) CM/SW Contact  Shelbie Hutching, RN Phone Number: 10/27/2020, 4:19 PM  Clinical Narrative:    [facility] was scheduled to pick patient up at 1515 but when they arrived patient was not ready and they had other transports.  [facility] MD says just to call EMS.  EMS transport arranged- patient is number 4 on their list for pick up.    Expected Discharge Plan: [place] Barriers to Discharge: Barriers Resolved  Expected Discharge Plan and Services Expected Discharge Plan: [place]   Discharge Planning Services: CM Consult Post Acute Care Choice: [place] Living arrangements for the past 2 months: [place] Expected Discharge Date: 10/27/20               DME Arranged: N/A DME Agency: NA       HH Arranged: NA HH Agency: NA         Social Determinants of Health (SDOH) Interventions    Readmission Risk Interventions No flowsheet data found.

## 2020-10-27 NOTE — Progress Notes (Signed)
Physical Therapy Treatment Patient Details Name: Kaitlin Wagner MRN: [id number] DOB: Jul 22, 1945 Today's Date: 10/27/2020    History of Present Illness Kaitlin Wagner is a 2yoF who comes to [facility] on 4/22 via transport van from [facility] facility due to thrush, diarrhea, poor PO intake, AMS. PMH: sacral wound, dementia, HTN, HLD, DM2, SOB, TIA, OSA. Pt is a [facility] participant. Pt has had indwelling foley sicne 4/15 to facilitate scaral wound healing. Pt has been at [facility] for assistance in sacral ulcer healing for ~6 months. Here surgery consulted for "small bowel pneumatosis and free air," aiming for conservative management first, as she would not tolerate extensive surgical procedures. Recent baseline has only included a few weeks of ambulatory status due to falls anxiety, dementia, and pain inhibition from wound. Pt uses a tilt n space WC to go from [facility] to [facility] for [facility] 5x/week.    PT Comments    Kaitlin Wagner was seen for OT/PT co-treatment on this date. Pt ready to d/c in personal w/c, RN requesting assistance for w/c transfer. Pt found to have BM, required TOTAL A x2 toileting at bed level. TOTAL A x2 sup<>sit. MaxA x2 don B socks seated EOB.  Once in seated position, pt able to perform LAQ at EOB with good technique.  Pt demonstrated poor-fair static sitting balance with BUE support - intermittent CGA tolerates ~10 secs for x3 trials. Pt making good progress toward goals. Pt continues to benefit from skilled PT services to maximize return to PLOF and minimize risk of future falls, injury, caregiver burden, and readmission. Will continue to follow POC. Discharge recommendation remains appropriate.      Follow Up Recommendations  SNF;Supervision for mobility/OOB;Supervision - Intermittent     Equipment Recommendations  None recommended by PT    Recommendations for Other Services       Precautions / Restrictions Precautions Precautions: Fall Precaution Comments: Sacral wound Restrictions Weight  Bearing Restrictions: No    Mobility  Bed Mobility Overal bed mobility: Needs Assistance Bed Mobility: Rolling;Supine to Sit;Sit to Supine Rolling: Max assist;+2 for physical assistance   Supine to sit: Total assist;+2 for physical assistance Sit to supine: Total assist;+2 for physical assistance   General bed mobility comments: increased time, encouragement, effort, cues to sequence    Transfers                 General transfer comment: not tested, pt fearful  Ambulation/Gait                 Stairs             Wheelchair Mobility    Modified Rankin (Stroke Patients Only)       Balance Overall balance assessment: Needs assistance Sitting-balance support: Bilateral upper extremity supported Sitting balance-Leahy Scale: Fair Sitting balance - Comments: intermittent CGA sitting EOB c BUE support, toelrates ~10 secs for x3 trials                                    Cognition Arousal/Alertness: Awake/alert Behavior During Therapy: Flat affect Overall Cognitive Status: History of cognitive impairments - at baseline                                 General Comments: Pt requires initiation and sequencing cues throughout, follows simple one step commands ~80% of the time, but with very  poor short term memory requring re-direct to attend to task after only meaningfully participating in ~10-15 second bouts.      Exercises General Exercises - Lower Extremity Ankle Circles/Pumps: AAROM;Strengthening;Both;10 reps;Supine Long Arc Quad: AROM;Strengthening;Both;10 reps;Seated Other Exercises Other Exercises: Pt educated re: OT role, DME recs, d/c recs, falls prevention Other Exercises: LBD, UBD, sup<>sit, toileting, rolling, sitting balnace/tolerance    General Comments        Pertinent Vitals/Pain Pain Assessment: No/denies pain    Home Living                      Prior Function            PT Goals (current  goals can now be found in the care plan section) Acute Rehab PT Goals Patient Stated Goal: none stated    Frequency    Min 2X/week      PT Plan Current plan remains appropriate    Co-evaluation              AM-PAC PT "6 Clicks" Mobility   Outcome Measure  Help needed turning from your back to your side while in a flat bed without using bedrails?: Total Help needed moving from lying on your back to sitting on the side of a flat bed without using bedrails?: Total Help needed moving to and from a bed to a chair (including a wheelchair)?: Total Help needed standing up from a chair using your arms (e.g., wheelchair or bedside chair)?: Total Help needed to walk in hospital room?: Total Help needed climbing 3-5 steps with a railing? : Total 6 Click Score: 6    End of Session   Activity Tolerance: No increased pain;Patient tolerated treatment well Patient left: in bed;with call bell/phone within reach;with bed alarm set Nurse Communication: Mobility status PT Visit Diagnosis: Muscle weakness (generalized) (M62.81);Adult, failure to thrive (R62.7)     Time: 5400-8676 PT Time Calculation (min) (ACUTE ONLY): 23 min  Charges:  $Therapeutic Exercise: 8-22 mins                     Gwenlyn Saran, PT, DPT 10/27/20, 5:01 PM    Christie Nottingham 10/27/2020, 4:59 PM

## 2020-10-27 NOTE — Progress Notes (Signed)
Occupational Therapy Treatment Patient Details Name: Kaitlin Wagner MRN: [id number] DOB: Aug 30, 1945 Today's Date: 10/27/2020    History of present illness Kaitlin Wagner is a 8yoF who comes to [facility] on 4/22 via transport van from [facility] facility due to thrush, diarrhea, poor PO intake, AMS. PMH: sacral wound, dementia, HTN, HLD, DM2, SOB, TIA, OSA. Pt is a [facility] participant. Pt has had indwelling foley sicne 4/15 to facilitate scaral wound healing. Pt has been at [facility] for assistance in sacral ulcer healing for ~6 months. Here surgery consulted for "small bowel pneumatosis and free air," aiming for conservative management first, as she would not tolerate extensive surgical procedures. Recent baseline has only included a few weeks of ambulatory status due to falls anxiety, dementia, and pain inhibition from wound. Pt uses a tilt n space WC [facility] to [facility] 5x[facility]week.   OT comments  Kaitlin Wagner was seen for OT/PT co-treatment on this date. Pt ready to d/c in personal w/c, RN requesting assistance for w/c transfer. Pt found to have BM, required TOTAL A x2 toileting at bed level. TOTAL A x2 sup<>sit. MAX A x2 don/doff dress sitting EOB, pt assisted with threading over BUE. MAX A x2 don B socks seated EOB. Pt demonstrated poor-fair static sitting balance with BUE support - intermittent CGA tolerates ~10 secs for x3 trials. Pt making good progress toward goals. Pt continues to benefit from skilled OT services to maximize return to PLOF and minimize risk of future falls, injury, caregiver burden, and readmission. Will continue to follow POC. Discharge recommendation remains appropriate.    Follow Up Recommendations  SNF    Equipment Recommendations  Other (comment) (defer)    Recommendations for Other Services      Precautions / Restrictions Precautions Precautions: Fall Precaution Comments: Sacral wound Restrictions Weight Bearing Restrictions: No       Mobility Bed Mobility Overal  bed mobility: Needs Assistance Bed Mobility: Rolling;Supine to Sit;Sit to Supine Rolling: Max assist;+2 for physical assistance   Supine to sit: Total assist;+2 for physical assistance Sit to supine: Total assist;+2 for physical assistance   General bed mobility comments: increased time, encouragement, effort, cues to sequence    Transfers                 General transfer comment: not tested, pt fearful    Balance Overall balance assessment: Needs assistance Sitting-balance support: Bilateral upper extremity supported Sitting balance-Leahy Scale: Fair Sitting balance - Comments: intermittent CGA sitting EOB c BUE support, toelrates ~10 secs for x3 trials                                   ADL either performed or assessed with clinical judgement   ADL Overall ADL's : Needs assistance/impaired                                       General ADL Comments: MAX A x2 don/doff dress sitting EOB. MAX A x2 don B socks seated EOB. TOTAL A x2 toileting at bed level               Cognition Arousal/Alertness: Awake/alert Behavior During Therapy: Flat affect Overall Cognitive Status: History of cognitive impairments - at baseline  General Comments: Pt requires initiation and sequencing cues throughout, follows simple one step commands ~80% of the time, but with very poor short term memory requring re-direct to attend to task after only meaningfully participating in ~10-15 second bouts.        Exercises Exercises: Other exercises Other Exercises Other Exercises: Pt educated re: OT role, DME recs, d/c recs, falls prevention Other Exercises: LBD, UBD, sup<>sit, toileting, rolling, sitting balnace/tolerance           Pertinent Vitals/ Pain       Pain Assessment: No/denies pain         Frequency  Min 1X/week        Progress Toward Goals  OT Goals(current goals can now be found in the care  plan section)  Progress towards OT goals: Progressing toward goals  Acute Rehab OT Goals Patient Stated Goal: none stated OT Goal Formulation: With patient Time For Goal Achievement: 11/07/20 Potential to Achieve Goals: Good ADL Goals Pt Will Perform Eating: with supervision;with set-up Pt Will Perform Grooming: with set-up;with supervision;with min assist Additional ADL Goal #1: Pt will perform bed mobility with MOD A +1 to support repositioning, wound care, and ADL participation.  Plan Discharge plan remains appropriate;Frequency remains appropriate       AM-PAC OT "6 Clicks" Daily Activity     Outcome Measure   Help from another person eating meals?: A Little Help from another person taking care of personal grooming?: A Lot Help from another person toileting, which includes using toliet, bedpan, or urinal?: Total Help from another person bathing (including washing, rinsing, drying)?: A Lot Help from another person to put on and taking off regular upper body clothing?: A Lot Help from another person to put on and taking off regular lower body clothing?: Total 6 Click Score: 11    End of Session    OT Visit Diagnosis: Other abnormalities of gait and mobility (R26.89);Other symptoms and signs involving cognitive function;Muscle weakness (generalized) (M62.81)   Activity Tolerance Patient tolerated treatment well   Patient Left in bed;with call bell/phone within reach;with bed alarm set   Nurse Communication Mobility status        Time: 6503-5465 OT Time Calculation (min): 23 min  Charges: OT General Charges $OT Visit: 1 Visit OT Treatments $Self Care/Home Management : 8-22 mins   Dessie Coma, M.S. OTR/L  10/27/20, 4:21 PM  ascom [phone]

## 2020-10-27 NOTE — Care Management Important Message (Signed)
Important Message  Patient Details  Name: Kaitlin Wagner MRN: [id number] Date of Birth: 03-01-1946   Medicare Important Message Given:  Yes     [name] 10/27/2020, 10:38 AM

## 2021-07-31 ENCOUNTER — Inpatient Hospital Stay: Payer: Medicare (Managed Care)

## 2021-07-31 ENCOUNTER — Emergency Department: Payer: Medicare (Managed Care)

## 2021-07-31 ENCOUNTER — Other Ambulatory Visit: Payer: Self-pay

## 2021-07-31 ENCOUNTER — Inpatient Hospital Stay
Admission: EM | Admit: 2021-07-31 | Discharge: 2021-08-16 | DRG: 682 | Disposition: A | Discharge status: Home / Self Care | Payer: Medicare (Managed Care) | Attending: Internal Medicine | Admitting: Internal Medicine

## 2021-07-31 DIAGNOSIS — F0393 Unspecified dementia, unspecified severity, with mood disturbance: Secondary | ICD-10-CM | POA: Diagnosis present

## 2021-07-31 DIAGNOSIS — E1122 Type 2 diabetes mellitus with diabetic chronic kidney disease: Secondary | ICD-10-CM | POA: Diagnosis present

## 2021-07-31 DIAGNOSIS — F03C18 Unspecified dementia, severe, with other behavioral disturbance: Secondary | ICD-10-CM

## 2021-07-31 DIAGNOSIS — Z79899 Other long term (current) drug therapy: Secondary | ICD-10-CM

## 2021-07-31 DIAGNOSIS — E785 Hyperlipidemia, unspecified: Secondary | ICD-10-CM | POA: Diagnosis present

## 2021-07-31 DIAGNOSIS — Z7189 Other specified counseling: Secondary | ICD-10-CM | POA: Diagnosis not present

## 2021-07-31 DIAGNOSIS — F32A Depression, unspecified: Secondary | ICD-10-CM | POA: Diagnosis present

## 2021-07-31 DIAGNOSIS — R040 Epistaxis: Secondary | ICD-10-CM | POA: Diagnosis not present

## 2021-07-31 DIAGNOSIS — D631 Anemia in chronic kidney disease: Secondary | ICD-10-CM | POA: Diagnosis present

## 2021-07-31 DIAGNOSIS — F03C0 Unspecified dementia, severe, without behavioral disturbance, psychotic disturbance, mood disturbance, and anxiety: Secondary | ICD-10-CM | POA: Diagnosis not present

## 2021-07-31 DIAGNOSIS — R54 Age-related physical debility: Secondary | ICD-10-CM | POA: Diagnosis present

## 2021-07-31 DIAGNOSIS — N19 Unspecified kidney failure: Secondary | ICD-10-CM

## 2021-07-31 DIAGNOSIS — I700 Atherosclerosis of aorta: Secondary | ICD-10-CM | POA: Diagnosis present

## 2021-07-31 DIAGNOSIS — R140 Abdominal distension (gaseous): Secondary | ICD-10-CM | POA: Diagnosis not present

## 2021-07-31 DIAGNOSIS — Z66 Do not resuscitate: Secondary | ICD-10-CM

## 2021-07-31 DIAGNOSIS — Z9049 Acquired absence of other specified parts of digestive tract: Secondary | ICD-10-CM

## 2021-07-31 DIAGNOSIS — Z515 Encounter for palliative care: Secondary | ICD-10-CM | POA: Diagnosis not present

## 2021-07-31 DIAGNOSIS — N179 Acute kidney failure, unspecified: Principal | ICD-10-CM

## 2021-07-31 DIAGNOSIS — Z20822 Contact with and (suspected) exposure to covid-19: Secondary | ICD-10-CM | POA: Diagnosis present

## 2021-07-31 DIAGNOSIS — L89312 Pressure ulcer of right buttock, stage 2: Secondary | ICD-10-CM | POA: Diagnosis present

## 2021-07-31 DIAGNOSIS — Z7401 Bed confinement status: Secondary | ICD-10-CM

## 2021-07-31 DIAGNOSIS — K219 Gastro-esophageal reflux disease without esophagitis: Secondary | ICD-10-CM | POA: Diagnosis present

## 2021-07-31 DIAGNOSIS — Z681 Body mass index (BMI) 19 or less, adult: Secondary | ICD-10-CM | POA: Diagnosis not present

## 2021-07-31 DIAGNOSIS — R339 Retention of urine, unspecified: Secondary | ICD-10-CM | POA: Diagnosis not present

## 2021-07-31 DIAGNOSIS — D696 Thrombocytopenia, unspecified: Secondary | ICD-10-CM | POA: Diagnosis not present

## 2021-07-31 DIAGNOSIS — I10 Essential (primary) hypertension: Secondary | ICD-10-CM

## 2021-07-31 DIAGNOSIS — G9341 Metabolic encephalopathy: Secondary | ICD-10-CM | POA: Diagnosis present

## 2021-07-31 DIAGNOSIS — E870 Hyperosmolality and hypernatremia: Secondary | ICD-10-CM | POA: Diagnosis present

## 2021-07-31 DIAGNOSIS — E872 Acidosis, unspecified: Secondary | ICD-10-CM | POA: Diagnosis present

## 2021-07-31 DIAGNOSIS — E43 Unspecified severe protein-calorie malnutrition: Secondary | ICD-10-CM | POA: Diagnosis present

## 2021-07-31 DIAGNOSIS — I129 Hypertensive chronic kidney disease with stage 1 through stage 4 chronic kidney disease, or unspecified chronic kidney disease: Secondary | ICD-10-CM | POA: Diagnosis present

## 2021-07-31 DIAGNOSIS — E876 Hypokalemia: Secondary | ICD-10-CM | POA: Diagnosis not present

## 2021-07-31 DIAGNOSIS — E8770 Fluid overload, unspecified: Secondary | ICD-10-CM | POA: Diagnosis not present

## 2021-07-31 DIAGNOSIS — D72829 Elevated white blood cell count, unspecified: Secondary | ICD-10-CM | POA: Diagnosis not present

## 2021-07-31 DIAGNOSIS — N1832 Chronic kidney disease, stage 3b: Secondary | ICD-10-CM | POA: Diagnosis present

## 2021-07-31 DIAGNOSIS — L89511 Pressure ulcer of right ankle, stage 1: Secondary | ICD-10-CM | POA: Diagnosis not present

## 2021-07-31 DIAGNOSIS — E875 Hyperkalemia: Secondary | ICD-10-CM | POA: Diagnosis present

## 2021-07-31 DIAGNOSIS — R627 Adult failure to thrive: Secondary | ICD-10-CM | POA: Diagnosis present

## 2021-07-31 DIAGNOSIS — E860 Dehydration: Secondary | ICD-10-CM | POA: Diagnosis present

## 2021-07-31 DIAGNOSIS — N200 Calculus of kidney: Secondary | ICD-10-CM | POA: Diagnosis present

## 2021-07-31 DIAGNOSIS — Z7982 Long term (current) use of aspirin: Secondary | ICD-10-CM

## 2021-07-31 DIAGNOSIS — K068 Other specified disorders of gingiva and edentulous alveolar ridge: Secondary | ICD-10-CM | POA: Diagnosis not present

## 2021-07-31 LAB — COMPREHENSIVE METABOLIC PANEL
ALT: 47 U/L — ABNORMAL HIGH (ref 0–44)
AST: 34 U/L (ref 15–41)
Albumin: 3.8 g/dL (ref 3.5–5.0)
Alkaline Phosphatase: 127 U/L — ABNORMAL HIGH (ref 38–126)
Anion gap: 20 — ABNORMAL HIGH (ref 5–15)
BUN: 236 mg/dL — ABNORMAL HIGH (ref 8–23)
CO2: 12 mmol/L — ABNORMAL LOW (ref 22–32)
Calcium: 9 mg/dL (ref 8.9–10.3)
Chloride: 106 mmol/L (ref 98–111)
Creatinine, Ser: 13.62 mg/dL — ABNORMAL HIGH (ref 0.44–1.00)
GFR, Estimated: 3 mL/min — ABNORMAL LOW (ref >60)
Glucose, Bld: 122 mg/dL — ABNORMAL HIGH (ref 70–99)
Potassium: 5.7 mmol/L — ABNORMAL HIGH (ref 3.5–5.1)
Sodium: 138 mmol/L (ref 135–145)
Total Bilirubin: 0.5 mg/dL (ref 0.3–1.2)
Total Protein: 8 g/dL (ref 6.5–8.1)

## 2021-07-31 LAB — URINALYSIS, COMPLETE (UACMP) WITH MICROSCOPIC
Bacteria, UA: NONE SEEN
Bilirubin Urine: NEGATIVE
Glucose, UA: NEGATIVE mg/dL
Ketones, ur: NEGATIVE mg/dL
Nitrite: NEGATIVE
Protein, ur: NEGATIVE mg/dL
Specific Gravity, Urine: 1.01 (ref 1.005–1.030)
pH: 5 (ref 5.0–8.0)

## 2021-07-31 LAB — CBC
HCT: 37.9 % (ref 36.0–46.0)
Hemoglobin: 13 g/dL (ref 12.0–15.0)
MCH: 29.1 pg (ref 26.0–34.0)
MCHC: 34.3 g/dL (ref 30.0–36.0)
MCV: 85 fL (ref 80.0–100.0)
Platelets: 200 10*3/uL (ref 150–400)
RBC: 4.46 MIL/uL (ref 3.87–5.11)
RDW: 15.3 % (ref 11.5–15.5)
WBC: 10.3 10*3/uL (ref 4.0–10.5)
nRBC: 0 % (ref 0.0–0.2)

## 2021-07-31 LAB — CBG MONITORING, ED: Glucose-Capillary: 111 mg/dL — ABNORMAL HIGH (ref 70–99)

## 2021-07-31 LAB — RESP PANEL BY RT-PCR (FLU A&B, COVID) ARPGX2
Influenza A by PCR: NEGATIVE
Influenza B by PCR: NEGATIVE
SARS Coronavirus 2 by RT PCR: NEGATIVE

## 2021-07-31 MED ORDER — HEPARIN SODIUM (PORCINE) 5000 UNIT/ML IJ SOLN
5000.0000 [IU] | Freq: Three times a day (TID) | INTRAMUSCULAR | Status: DC
  Administered 2021-07-31 – 2021-08-01 (×2): 5000 [IU] via SUBCUTANEOUS
  Filled 2021-07-31 (×3): qty 1

## 2021-07-31 MED ORDER — PANTOPRAZOLE SODIUM 20 MG PO TBEC
20.0000 mg | DELAYED_RELEASE_TABLET | Freq: Every day | ORAL | Status: DC
  Administered 2021-08-01 – 2021-08-16 (×15): 20 mg via ORAL
  Filled 2021-07-31 (×17): qty 1

## 2021-07-31 MED ORDER — ACETAMINOPHEN 650 MG RE SUPP: 650.0000 mg | Freq: Four times a day (QID) | RECTAL | Status: DC | PRN

## 2021-07-31 MED ORDER — ASPIRIN EC 81 MG PO TBEC: 81.0000 mg | DELAYED_RELEASE_TABLET | Freq: Every day | ORAL | Status: DC

## 2021-07-31 MED ORDER — ESOMEPRAZOLE MAGNESIUM 10 MG PO PACK: 10.0000 mg | PACK | Freq: Every day | ORAL | Status: DC

## 2021-07-31 MED ORDER — SODIUM CHLORIDE 0.9 % IV BOLUS
1000.0000 mL | Freq: Once | INTRAVENOUS | Status: AC
  Administered 2021-07-31: 1000 mL via INTRAVENOUS

## 2021-07-31 MED ORDER — ACETAMINOPHEN 325 MG PO TABS
650.0000 mg | ORAL_TABLET | Freq: Four times a day (QID) | ORAL | Status: DC | PRN
  Administered 2021-08-10: 650 mg via ORAL
  Filled 2021-07-31: qty 2

## 2021-07-31 MED ORDER — SODIUM CHLORIDE 0.9 % IV SOLN: INTRAVENOUS | Status: DC

## 2021-07-31 MED ORDER — SODIUM ZIRCONIUM CYCLOSILICATE 10 G PO PACK
10.0000 g | PACK | Freq: Once | ORAL | Status: AC
  Administered 2021-07-31: 10 g via ORAL
  Filled 2021-07-31: qty 1

## 2021-07-31 MED ORDER — SODIUM BICARBONATE 650 MG PO TABS
650.0000 mg | ORAL_TABLET | Freq: Three times a day (TID) | ORAL | Status: DC
  Administered 2021-07-31 – 2021-08-02 (×5): 650 mg via ORAL
  Filled 2021-07-31 (×7): qty 1

## 2021-07-31 NOTE — Assessment & Plan Note (Addendum)
--  At baseline, family report she is oriented to self and family, aware of home surroundings, can converse with them appropriately. -- Suspect very close to baseline

## 2021-07-31 NOTE — Assessment & Plan Note (Addendum)
Continue PPI ?

## 2021-07-31 NOTE — ED Triage Notes (Signed)
Pt was sent to the ER for renal failure, pt denies feeling bad or having pain Bun in the 200's and creatinine of 13

## 2021-07-31 NOTE — ED Notes (Signed)
Pt back from CT at this time 

## 2021-07-31 NOTE — ACP (Advance Care Planning) (Signed)
°  Advance Care Planning  Reason for Advance Care Planning Conversation: Acute hospitalization Principal Problem:   Acute renal failure (ARF) ([place]) Active Problems:   Advanced dementia   GERD (gastroesophageal reflux disease)   Depression   DNR (do not resuscitate)/DNI(Do Not Intubate)    I discussed with  pt's dtr toinette stout  about advance care planning. Specifically, we discussed whether patient would desire cardiopulmonary resuscitation (CPR) in the event of acute cardiopulmonary arrest. We also discussed whether endotracheal intubation and temporary ventilator life support would be desired in the event of acute cardio- or pulmonary decompensation.   Code status order of DNR has been entered in accord with the patient's wishes. no intubation  Living will: no  Health Care Agent / Davonna Belling              Name: toinette stout: Relationship to Patient: dtr   Is agent appointed in legal document no  Time spent today in ACP discussion was  5 mins  Kristopher Oppenheim, DO [facility]

## 2021-07-31 NOTE — ED Provider Notes (Signed)
[facility] Provider Note    Event Date/Time   First MD Initiated Contact with Patient 07/31/21 1318     (approximate)   History   Acute Renal Failure   HPI  Kaitlin Wagner is a 76 y.o. female who lives at home with her daughter.  Her daughter had to go out of town for a little bit and put her in [facility] for respite care.  There patient was not eating or drinking much.  She went to the doctor today to have labs done and found that she was in acute renal failure.  Patient comes here with BUN and creatinine which are markedly elevated.  Patient herself denies any chest pain or shortness of breath or abdominal pain nausea vomiting or diarrhea.  She is afebrile has normal vital signs.      Physical Exam   Triage Vital Signs: ED Triage Vitals  Enc Vitals Group     BP 07/31/21 1020 (!) 133/53     Pulse Rate 07/31/21 1020 72     Resp 07/31/21 1020 16     Temp 07/31/21 1020 (!) 96 F (35.6 C)     Temp Source 07/31/21 1020 Axillary     SpO2 07/31/21 1020 100 %     Weight 07/31/21 1021 145 lb (65.8 kg)     Height 07/31/21 1021 5\' 6"  (1.676 m)     Head Circumference --      Peak Flow --      Pain Score 07/31/21 1021 0     Pain Loc --      Pain Edu? --      Excl. in [place]? --     Most recent vital signs: Vitals:   07/31/21 1303 07/31/21 1304  BP: (!) 129/51 (!) 129/51  Pulse: 75 75  Resp:  16  Temp:  (!) 97.2 F (36.2 C)  SpO2: 100% 100%    General: Awake, alert, no distress.  CV:  Good peripheral perfusion.  Heart regular rate and rhythm no audible murmurs Resp:  Normal effort.  Lungs are clear Abd:  No distention.  Abdomen soft bowel sounds positive nontender Extremities: No edema   ED Results / Procedures / Treatments   Labs (all labs ordered are listed, but only abnormal results are displayed) Labs Reviewed  COMPREHENSIVE METABOLIC PANEL - Abnormal; Notable for the following components:      Result Value   Potassium 5.7 (*)     CO2 12 (*)    Glucose, Bld 122 (*)    BUN 236 (*)    Creatinine, Ser 13.62 (*)    ALT 47 (*)    Alkaline Phosphatase 127 (*)    GFR, Estimated 3 (*)    Anion gap 20 (*)    All other components within normal limits  URINALYSIS, COMPLETE (UACMP) WITH MICROSCOPIC - Abnormal; Notable for the following components:   Hgb urine dipstick TRACE (*)    Leukocytes,Ua SMALL (*)    All other components within normal limits  RESP PANEL BY RT-PCR (FLU A&B, COVID) [id number]  CBC     EKG     RADIOLOGY CT done to evaluate patient for any renal obstruction does not show any renal obstruction there is there are a lot of air-filled loops of bowel.  Radiologist does not see an obstruction but notes that this could represent ileus.   PROCEDURES:  Critical Care performed:   Procedures   MEDICATIONS ORDERED IN ED: Medications  sodium chloride 0.9 % bolus 1,000 mL (1,000 mLs Intravenous New Bag/Given 07/31/21 1324)  sodium zirconium cyclosilicate (LOKELMA) packet 10 g (10 g Oral Given 07/31/21 1637)     IMPRESSION / MDM / ASSESSMENT AND PLAN / ED COURSE  I reviewed the triage vital signs and the nursing notes. Patient with acute kidney injury.  Markedly elevated BUN/creatinine.  Her H&H is stable actually slightly higher than previously.  This may be due to dehydration.  I cannot do a rectal to check on the BUN because patient is in the hallway.  We will give the patient fluids.  There is no sign of any bowel or kidney obstruction.  Patient's EKG does not show any signs of hyperkalemia and her potassium which was 5.9 earlier is 5.7 here.  This is slightly elevated but not dangerously so.  I do not think we need to do anything about it currently except for the hydration and careful observation of the potassium.    he patient is on the cardiac monitor to evaluate for evidence of arrhythmia and/or significant heart rate changes.        FINAL CLINICAL IMPRESSION(S) / ED DIAGNOSES   Final  diagnoses:  AKI (acute kidney injury) ([place])     Rx / DC Orders   ED Discharge Orders     None        Note:  This document was prepared using Dragon voice recognition software and may include unintentional dictation errors.   Nena Polio, MD 07/31/21 [id number]

## 2021-07-31 NOTE — Subjective & Objective (Signed)
CC: elevated BUN/Scr HPI: 76 year old African-American female who lives at home with her daughter, presents to the ER today from PCP clinic.  Patient noted to have an elevated BUN and creatinine.  Daughter states the patient was in respite care last week for only 4 days.  She was admitted to a local nursing home from the 19th through 22 January.  Patient's been home since then.  Daughter has noticed decreased appetite.  Patient has still been drinking Ensure.  Daughter has noted no melena.  She states the patient's urine is still light yellow in color.  She has not noted any blood in her urine.  Daughter has noted that the patient has been increasingly weak over the last 7 days.  Is now having to use the wheelchair when she was normally ambulatory with a walker.  Patient not had any fevers, diarrhea, vomiting.  Daughter states the patient did get some Pedialyte last couple days due to poor p.o. intake.  She did notice maybe 1 dark stools but definitely no melanotic/tarry stools.  Daughter states the patient does have occasional rectal bleeding due to rectal fissures.  Patient sent from the PCP office due to elevated BUN and creatinine.  Vital signs in ER temp 96, heart rate 72, blood pressure 133/53.  Labs showed a BUN of 236, creatinine 13.6, bicarbonate of 12, potassium 5.7.  White count of 10.3, hemoglobin 13, platelets of 200.  CT abdomen pelvis without contrast demonstrated bilateral renal calculi without hydronephrosis.  To the patient's elevated BUN/creatinine, [facility] hospitalist contacted for admission.

## 2021-07-31 NOTE — H&P (Signed)
History and Physical    Corretta Munce FWY:[id number] DOB: 01/03/1946 DOA: 07/31/2021  PCP: [place]   Patient coming from: Clinic  I have personally briefly reviewed patient's old medical records in [place]  CC: elevated BUN/Scr HPI: 76 year old African-American female who lives at home with her daughter, presents to the ER today from PCP clinic.  Patient noted to have an elevated BUN and creatinine.  Daughter states the patient was in respite care last week for only 4 days.  She was admitted to a local nursing home from the 19th through 22 January.  Patient's been home since then.  Daughter has noticed decreased appetite.  Patient has still been drinking Ensure.  Daughter has noted no melena.  She states the patient's urine is still light yellow in color.  She has not noted any blood in her urine.  Daughter has noted that the patient has been increasingly weak over the last 7 days.  Is now having to use the wheelchair when she was normally ambulatory with a walker.  Patient not had any fevers, diarrhea, vomiting.  Daughter states the patient did get some Pedialyte last couple days due to poor p.o. intake.  She did notice maybe 1 dark stools but definitely no melanotic/tarry stools.  Daughter states the patient does have occasional rectal bleeding due to rectal fissures.  Patient sent from the PCP office due to elevated BUN and creatinine.  Vital signs in ER temp 96, heart rate 72, blood pressure 133/53.  Labs showed a BUN of 236, creatinine 13.6, bicarbonate of 12, potassium 5.7.  White count of 10.3, hemoglobin 13, platelets of 200.  CT abdomen pelvis without contrast demonstrated bilateral renal calculi without hydronephrosis.  To the patient's elevated BUN/creatinine, [facility] hospitalist contacted for admission.   ED Course: elevated BUN/Scr. Pt in hallway. Making checking hemoccult difficult due to lack of patient privacy.  Review of Systems:  Review  of Systems  Constitutional:  Positive for malaise/fatigue. Negative for chills and fever.  HENT: Negative.    Eyes: Negative.   Respiratory: Negative.    Cardiovascular: Negative.   Gastrointestinal:  Negative for diarrhea, melena and vomiting.       Occ blood in stool from what dtr states is rectal fissures  Skin: Negative.   Neurological:        Acute on chronic weakness. Pt normally has to use walker but has needed more assistance this week and having to use wheelchair  Endo/Heme/Allergies: Negative.   Psychiatric/Behavioral: Negative.    All other systems reviewed and are negative.  Past Medical History:  Diagnosis Date   Cancer ([place]) colon   Diabetes mellitus without complication ([place])    GERD (gastroesophageal reflux disease)    Hyperlipidemia    Hypertension     Past Surgical History:  Procedure Laterality Date   CHOLECYSTECTOMY     colon resection     laparoscopic   COLON SURGERY     COLONOSCOPY WITH PROPOFOL N/A 05/12/2015   Procedure: COLONOSCOPY WITH PROPOFOL;  Surgeon: Hulen Luster, MD;  Location: [facility] ENDOSCOPY;  Service: Gastroenterology;  Laterality: N/A;   COLONOSCOPY WITH PROPOFOL N/A 05/12/2018   Procedure: COLONOSCOPY WITH PROPOFOL;  Surgeon: Toledo, Benay Pike, MD;  Location: [facility] ENDOSCOPY;  Service: Gastroenterology;  Laterality: N/A;   ESOPHAGOGASTRODUODENOSCOPY N/A 05/12/2015   Procedure: ESOPHAGOGASTRODUODENOSCOPY (EGD);  Surgeon: Hulen Luster, MD;  Location: [facility] ENDOSCOPY;  Service: Gastroenterology;  Laterality: N/A;     reports that she has never  smoked. She has never used smokeless tobacco. She reports that she does not drink alcohol and does not use drugs.  No Known Allergies  No family history on file.  Prior to Admission medications   Medication Sig Start Date End Date Taking? Authorizing Provider  Acetaminophen Childrens 160 MG/5ML SOLN Take 20 mLs by mouth 3 (three) times daily as needed for pain. 04/28/20   [provider]   amLODipine (NORVASC) 5 MG tablet Take 1 tablet (5 mg total) by mouth daily. 10/28/20   Eugenie Filler, MD  aspirin EC 81 MG tablet Take 81 mg by mouth daily.    [provider]  chlorhexidine (PERIDEX) 0.12 % solution Use as directed 15 mLs in the mouth or throat daily. 04/28/20   [provider]  Cholecalciferol 25 MCG (1000 UT) tablet Take 1,000 Units by mouth daily.    [provider]  DULoxetine (CYMBALTA) 30 MG capsule Take 30 mg by mouth daily. 10/19/20   [provider]  esomeprazole (NEXIUM) 10 MG packet Take 10 mg by mouth daily before breakfast.    [provider]  feeding supplement (ENSURE ENLIVE / ENSURE PLUS) LIQD Take 237 mLs by mouth 2 (two) times daily between meals. 10/27/20   Eugenie Filler, MD  fluticasone (FLONASE) 50 MCG/ACT nasal spray Place 1 spray into both nostrils daily. Patient not taking: No sig reported    [provider]  loperamide (IMODIUM) 2 MG capsule Take by mouth 4 (four) times daily as needed for diarrhea or loose stools. Patient not taking: No sig reported    [provider]  Prenatal Vit-Fe Fumarate-FA (PREPLUS) 27-1 MG TABS Take 1 tablet by mouth daily. 05/05/20   [provider]  Triamcinolone Acetonide (TRIAMCINOLONE 0.1 % CREAM : EUCERIN) CREA Apply 1 application topically as needed. Patient not taking: No sig reported    [provider]  zinc oxide 20 % ointment Apply 1 application topically as needed for irritation. Patient not taking: No sig reported    [provider]    Physical Exam: Vitals:   07/31/21 1021 07/31/21 1137 07/31/21 1303 07/31/21 1304  BP:  (!) 124/48 (!) 129/51 (!) 129/51  Pulse:  73 75 75  Resp:  16  16  Temp:    (!) 97.2 F (36.2 C)  TempSrc:      SpO2:  100% 100% 100%  Weight: 65.8 kg     Height: 5\' 6"  (1.676 m)       Physical Exam Vitals and nursing note reviewed.  Constitutional:      General: She is not in acute  distress.    Appearance: She is not toxic-appearing or diaphoretic.     Comments: Appears chronically ill  HENT:     Head: Normocephalic and atraumatic.     Nose: Nose normal.  Cardiovascular:     Rate and Rhythm: Normal rate and regular rhythm.     Heart sounds: Murmur heard.  Pulmonary:     Effort: Pulmonary effort is normal. No respiratory distress.     Breath sounds: No wheezing or rales.  Abdominal:     General: Bowel sounds are normal. There is distension.     Palpations: Abdomen is soft.     Tenderness: There is no abdominal tenderness.  Musculoskeletal:     Right lower leg: No edema.     Left lower leg: No edema.  Skin:    General: Skin is warm and dry.     Capillary  Refill: Capillary refill takes less than 2 seconds.  Neurological:     Mental Status: She is disoriented.     Labs on Admission: I have personally reviewed following labs and imaging studies  CBC: Recent Labs  Lab 07/31/21 1035  WBC 10.3  HGB 13.0  HCT 37.9  MCV 85.0  PLT 983   Basic Metabolic Panel: Recent Labs  Lab 07/31/21 1035  NA 138  K 5.7*  CL 106  CO2 12*  GLUCOSE 122*  BUN 236*  CREATININE 13.62*  CALCIUM 9.0   GFR: Estimated Creatinine Clearance: 3.3 mL/min (A) (by C-G formula based on SCr of 13.62 mg/dL (H)). Liver Function Tests: Recent Labs  Lab 07/31/21 1035  AST 34  ALT 47*  ALKPHOS 127*  BILITOT 0.5  PROT 8.0  ALBUMIN 3.8   No results for input(s): LIPASE, AMYLASE in the last 168 hours. No results for input(s): AMMONIA in the last 168 hours. Coagulation Profile: No results for input(s): INR, PROTIME in the last 168 hours. Cardiac Enzymes: No results for input(s): CKTOTAL, CKMB, CKMBINDEX, TROPONINI in the last 168 hours. BNP (last 3 results) No results for input(s): PROBNP in the last 8760 hours. HbA1C: No results for input(s): HGBA1C in the last 72 hours. CBG: No results for input(s): GLUCAP in the last 168 hours. Lipid Profile: No results for input(s):  CHOL, HDL, LDLCALC, TRIG, CHOLHDL, LDLDIRECT in the last 72 hours. Thyroid Function Tests: No results for input(s): TSH, T4TOTAL, FREET4, T3FREE, THYROIDAB in the last 72 hours. Anemia Panel: No results for input(s): VITAMINB12, FOLATE, FERRITIN, TIBC, IRON, RETICCTPCT in the last 72 hours. Urine analysis:   Radiological Exams on Admission: I have personally reviewed images CT ABDOMEN PELVIS WO CONTRAST  Result Date: 07/31/2021 CLINICAL DATA:  Acute renal failure. EXAM: CT ABDOMEN AND PELVIS WITHOUT CONTRAST TECHNIQUE: Multidetector CT imaging of the abdomen and pelvis was performed following the standard protocol without IV contrast. RADIATION DOSE REDUCTION: This exam was performed according to the departmental dose-optimization program which includes automated exposure control, adjustment of the mA and/or kV according to patient size and/or use of iterative reconstruction technique. COMPARISON:  10/20/2020 FINDINGS: Lower chest: Motion artifact at the lung bases. No large pleural effusion. Hepatobiliary: Cholecystectomy.  Normal appearance of the liver. Pancreas: Unremarkable. No pancreatic ductal dilatation or surrounding inflammatory changes. Spleen: Normal in size without focal abnormality. Adrenals/Urinary Tract: Normal adrenal glands. 8 mm stone in left kidney lower pole without hydronephrosis. Normal appearance of the urinary bladder with small to moderate amount of fluid. 2 mm stone in the right kidney lower pole without hydronephrosis. Motion artifact throughout the abdomen. Stomach/Bowel: Again noted are at least 2 areas with bowel anastomosis. Dilated gas-filled loops of bowel throughout the anterior aspect of the abdomen. Findings are similar to the exam from 10/20/2020. No focal bowel inflammation. Vascular/Lymphatic: Diffuse atherosclerotic calcifications in the aorta and iliac arteries. Negative for abdominal aortic aneurysm. No significant lymph node enlargement in the abdomen or pelvis.  Lymph node along the right external iliac nodal chain on sequence 2 image 61 measures 8 mm in the short axis and stable. Reproductive: Uterus and bilateral adnexa are unremarkable. Other: Again noted is laxity along the anterior abdominal wall with minimal subcutaneous fat along the anterior abdominal wall. Findings along the anterior abdominal wall are likely postoperative in etiology. Negative for ascites. Negative for free air. Musculoskeletal: Extensive heterogeneity in the lumbar spine is similar to the previous examination. Partial healing of the sacral decubitus ulcer. There continues  to be a small ulceration in this area. IMPRESSION: 1. Bilateral renal calculi without hydronephrosis. 2. Study has limitations from excessive motion artifact. 3. Gas-filled dilated loops of bowel throughout the abdomen. Postoperative bowel changes. Bowel gas distension is nonspecific but could represent an ileus. Limited evaluation due to extensive motion artifact on this examination. 4. Sacral decubitus ulceration has partially healed since 10/20/2020. 5.  Aortic Atherosclerosis (ICD10-I70.0). Electronically Signed   By: Markus Daft M.D.   On: 07/31/2021 13:15    EKG: I have personally reviewed EKG: NSR    Assessment/Plan Principal Problem:   Acute renal failure (ARF) (HCC) Active Problems:   Advanced dementia   GERD (gastroesophageal reflux disease)   Depression   DNR (do not resuscitate)/DNI(Do Not Intubate)    Acute renal failure (ARF) ([place]) Admit to med/surg bed. Inpatient status. Check renal U/S. Nephrology consult. Insert foley. Check UA. dtr denies pt taking any NSAIDs. Dtr([name]) may consider temporary HD but would not want chronic HD for patient. Check hemoccult.  Advanced dementia Chronic.  GERD (gastroesophageal reflux disease) Chronic.  Depression Chronic.  DNR (do not resuscitate)/DNI(Do Not Intubate) Verified DNR/DNI status with dtr [name].  DVT prophylaxis: SQ Heparin Code  Status: DNR/DNI(Do NOT Intubate) verified with pt's dtr [name] Family Communication: spoke with pt's dtr [place] at bedside  Disposition Plan: return home  Consults called: [place](nephrology). Sent secure chart for consult  Admission status: Inpatient, Med-Surg   Kristopher Oppenheim, DO [facility] 07/31/2021, 1:31 PM

## 2021-07-31 NOTE — Assessment & Plan Note (Signed)
Verified DNR/DNI status with dtr [name].

## 2021-07-31 NOTE — Assessment & Plan Note (Addendum)
-  Hold Celexa

## 2021-07-31 NOTE — Progress Notes (Signed)
Spoke with Dr. Wilford Grist, pt's PCP at [place]. She is the one that sent patient into ER for evaluation. She would like to be kept up to date with pt's progress.  I have left Dr. Holland Commons cell number in [facility] communication section.

## 2021-07-31 NOTE — Progress Notes (Signed)
Conducted a chart review and agree with current measures being taken including rehydration with IVF. No acute indication for dialysis at this time. Will conduct formal consult in am.

## 2021-07-31 NOTE — Assessment & Plan Note (Addendum)
-   Slowly improving.  Renal ultrasound was unremarkable.  Continue management as per nephrology.

## 2021-07-31 NOTE — ED Notes (Signed)
Report received from Olivia,RN

## 2021-08-01 ENCOUNTER — Encounter: Payer: Self-pay | Admitting: Internal Medicine

## 2021-08-01 ENCOUNTER — Inpatient Hospital Stay: Payer: Medicare (Managed Care)

## 2021-08-01 DIAGNOSIS — N179 Acute kidney failure, unspecified: Secondary | ICD-10-CM | POA: Diagnosis not present

## 2021-08-01 DIAGNOSIS — F03C0 Unspecified dementia, severe, without behavioral disturbance, psychotic disturbance, mood disturbance, and anxiety: Secondary | ICD-10-CM | POA: Diagnosis not present

## 2021-08-01 DIAGNOSIS — Z7189 Other specified counseling: Secondary | ICD-10-CM | POA: Diagnosis not present

## 2021-08-01 DIAGNOSIS — Z515 Encounter for palliative care: Secondary | ICD-10-CM | POA: Diagnosis not present

## 2021-08-01 DIAGNOSIS — R140 Abdominal distension (gaseous): Secondary | ICD-10-CM | POA: Insufficient documentation

## 2021-08-01 DIAGNOSIS — L89312 Pressure ulcer of right buttock, stage 2: Secondary | ICD-10-CM | POA: Diagnosis present

## 2021-08-01 DIAGNOSIS — E43 Unspecified severe protein-calorie malnutrition: Secondary | ICD-10-CM

## 2021-08-01 DIAGNOSIS — N19 Unspecified kidney failure: Secondary | ICD-10-CM

## 2021-08-01 DIAGNOSIS — G9341 Metabolic encephalopathy: Secondary | ICD-10-CM | POA: Diagnosis not present

## 2021-08-01 DIAGNOSIS — K219 Gastro-esophageal reflux disease without esophagitis: Secondary | ICD-10-CM

## 2021-08-01 DIAGNOSIS — N1832 Chronic kidney disease, stage 3b: Secondary | ICD-10-CM

## 2021-08-01 LAB — CBC WITH DIFFERENTIAL/PLATELET
Abs Immature Granulocytes: 0.04 10*3/uL (ref 0.00–0.07)
Basophils Absolute: 0 10*3/uL (ref 0.0–0.1)
Basophils Relative: 0 %
Eosinophils Absolute: 0.1 10*3/uL (ref 0.0–0.5)
Eosinophils Relative: 1 %
HCT: 29.9 % — ABNORMAL LOW (ref 36.0–46.0)
Hemoglobin: 10.5 g/dL — ABNORMAL LOW (ref 12.0–15.0)
Immature Granulocytes: 1 %
Lymphocytes Relative: 9 %
Lymphs Abs: 0.7 10*3/uL (ref 0.7–4.0)
MCH: 30.3 pg (ref 26.0–34.0)
MCHC: 35.1 g/dL (ref 30.0–36.0)
MCV: 86.4 fL (ref 80.0–100.0)
Monocytes Absolute: 0.6 10*3/uL (ref 0.1–1.0)
Monocytes Relative: 8 %
Neutro Abs: 6.2 10*3/uL (ref 1.7–7.7)
Neutrophils Relative %: 81 %
Platelets: 159 10*3/uL (ref 150–400)
RBC: 3.46 MIL/uL — ABNORMAL LOW (ref 3.87–5.11)
RDW: 14.9 % (ref 11.5–15.5)
WBC: 7.6 10*3/uL (ref 4.0–10.5)
nRBC: 0 % (ref 0.0–0.2)

## 2021-08-01 LAB — COMPREHENSIVE METABOLIC PANEL
ALT: 34 U/L (ref 0–44)
AST: 25 U/L (ref 15–41)
Albumin: 3.1 g/dL — ABNORMAL LOW (ref 3.5–5.0)
Alkaline Phosphatase: 104 U/L (ref 38–126)
Anion gap: 17 — ABNORMAL HIGH (ref 5–15)
BUN: 254 mg/dL — ABNORMAL HIGH (ref 8–23)
CO2: 11 mmol/L — ABNORMAL LOW (ref 22–32)
Calcium: 8.1 mg/dL — ABNORMAL LOW (ref 8.9–10.3)
Chloride: 113 mmol/L — ABNORMAL HIGH (ref 98–111)
Creatinine, Ser: 13.17 mg/dL — ABNORMAL HIGH (ref 0.44–1.00)
GFR, Estimated: 3 mL/min — ABNORMAL LOW (ref >60)
Glucose, Bld: 78 mg/dL (ref 70–99)
Potassium: 5.1 mmol/L (ref 3.5–5.1)
Sodium: 141 mmol/L (ref 135–145)
Total Bilirubin: 0.8 mg/dL (ref 0.3–1.2)
Total Protein: 6.4 g/dL — ABNORMAL LOW (ref 6.5–8.1)

## 2021-08-01 LAB — MAGNESIUM: Magnesium: 3.4 mg/dL — ABNORMAL HIGH (ref 1.7–2.4)

## 2021-08-01 MED ORDER — RENA-VITE PO TABS
1.0000 | ORAL_TABLET | Freq: Every day | ORAL | Status: DC
  Administered 2021-08-01 – 2021-08-09 (×7): 1 via ORAL
  Filled 2021-08-01 (×9): qty 1

## 2021-08-01 MED ORDER — SODIUM BICARBONATE 8.4 % IV SOLN
INTRAVENOUS | Status: DC
  Filled 2021-08-01: qty 1000
  Filled 2021-08-01 (×2): qty 150
  Filled 2021-08-01: qty 1000
  Filled 2021-08-01 (×2): qty 150

## 2021-08-01 MED ORDER — CHLORHEXIDINE GLUCONATE CLOTH 2 % EX PADS
6.0000 | MEDICATED_PAD | Freq: Every day | CUTANEOUS | Status: DC
  Administered 2021-08-01 – 2021-08-08 (×7): 6 via TOPICAL

## 2021-08-01 MED ORDER — NEPRO/CARBSTEADY PO LIQD
237.0000 mL | Freq: Three times a day (TID) | ORAL | Status: DC
  Administered 2021-08-01 – 2021-08-08 (×10): 237 mL via ORAL

## 2021-08-01 NOTE — Progress Notes (Signed)
°  Progress Note   Patient: Kaitlin Wagner VVO:[id number] DOB: 1946-03-25 DOA: 07/31/2021     1 DOS: the patient was seen and examined on 08/01/2021       Brief hospital course: Mrs. Trovato is a 76 y.o. F with advanced dementia, HTN, and CKD IIIb who was sent to ER by PCP for renal failure.  Patient recently spent few days in SNF for respite care.  On return home, family noted she was less responsive, weaker (requiring wheelchair) and had minimal appetite.  At PCP's office, found to have Cr 13 from baseline 1.6.       Assessment and Plan: * Acute renal failure (ARF) ([place])- (present on admission) - Continue IV fluids - Hold ARB and nephrotoxins - Consult nephrology - Trend BMP     Distended abdomen CT showed distended bowel loops. No vomiting or pain to suggest symptomatic ileus, but I would expect one with a BUN >200.  - Continue IV fluids     Advanced dementia- (present on admission) At baseline, family report she is oriented to self and family, aware of home surroundings, can converse with them appropriately.      Acute metabolic encephalopathy See above baseline.  Here, she is somnolent due to uremia.  Uremia Epistaxis and gums bleeding noted. -Hold ppx heparin   Protein-calorie malnutrition, severe  Stage 3b chronic kidney disease (CKD) (HCC) Baseline GFR 33, Cr ~1.5  Pressure injury of right buttock, stage 2 ([place])- (present on admission) - WOC  DNR (do not resuscitate)/DNI(Do Not Intubate) Verified DNR/DNI status with dtr [name].  Depression- (present on admission) -Hold Celexa  Primary hypertension BP normal off meds  GERD (gastroesophageal reflux disease)- (present on admission) -Continue PPI      Subjective: Patient is somnolent, she has had some epistaxis and some gums bleeding, no current fever, no vomiting, no pain complaints.  Physical Exam: Vitals:   08/01/21 0455 08/01/21 0752 08/01/21 0900 08/01/21 1513  BP: (!) 115/44 (!)  128/48  (!) 121/47  Pulse: 84 93  87  Resp: 20 16  16   Temp: 97.9 F (36.6 C) 98.7 F (37.1 C) 98.8 F (37.1 C) 98.7 F (37.1 C)  TempSrc:   Axillary   SpO2: 99% 99%  99%  Weight:      Height:       Elderly adult female, somnolent, lying in bed, arouses sluggishly then falls back asleep Heart rate normal, rhythm normal, soft systolic murmur, no lower extremity edema, no JVD Normal respiratory rate and rhythm, lungs clear without rales or wheezes No abdomen distention, no tenderness palpation Sluggish, sleepy, psychomotor slowing noted, not oriented to self, place, or situation.      Data Reviewed: Discussed with nephrology and type of care. Patient's lab studies and imaging remarkable for creatinine with no improvement overnight, BUN up to 54, bicarb 11 Renal ultrasound unremarkable Hemoglobin 10.5, otherwise hemogram normal  Family Communication: Daughter at the bedside  Disposition: Status is: Inpatient Remains inpatient appropriate because: She has acute renal failure.  Her prior functional status (walk short distances, otherwise in a wheelchair), dementia, severe uremia causing epistaxis, encephalopathy, and possibly ileus, I suspect the patient will have minimal tolerance for even short-term dialysis.  Defer to nephrology if they wish to offer this temporarily          Planned Discharge Destination:  To be determined       Author: Edwin Dada, MD 08/01/2021 7:09 PM  For on call review [email or web address].

## 2021-08-01 NOTE — Plan of Care (Signed)
  Problem: Health Behavior/Discharge Planning: Goal: Ability to manage health-related needs will improve Outcome: Progressing   Problem: Clinical Measurements: Goal: Ability to maintain clinical measurements within normal limits will improve Outcome: Progressing   

## 2021-08-01 NOTE — Assessment & Plan Note (Addendum)
--  Baseline GFR 33, Cr ~1.5

## 2021-08-01 NOTE — Hospital Course (Addendum)
76 year old woman with advanced dementia presented with history of being less responsive, weaker and minimal appetite. Found to have creatinine of 13 from baseline of 1.6.  Admitted for acute renal failure. --Seen by nephrology, slowly improving with conservative management.

## 2021-08-01 NOTE — Progress Notes (Signed)
I informed K. Foust, NP that after applying several warm blanks to the pt's body and one to her head I was unable to a temperature on her. Foust, NP ordered a [employer] and it was applied to the pt at [place].

## 2021-08-01 NOTE — Assessment & Plan Note (Addendum)
--   Secondary to acute renal failure, slowly improving

## 2021-08-01 NOTE — Plan of Care (Signed)
  Problem: Health Behavior/Discharge Planning: Goal: Ability to manage health-related needs will improve Outcome: Progressing   Problem: Clinical Measurements: Goal: Diagnostic test results will improve Outcome: Progressing   

## 2021-08-01 NOTE — Consult Note (Addendum)
[facility]  CONSULT NOTE    Date: 08/01/2021                  Kaitlin Wagner Name:  Kaitlin Wagner  MRN: [id number]  DOB: [birth year]  Age / Sex: 76 y.o., female         PCP: Marnee Guarneri, MD                 Service Requesting Consult: [place]                 Reason for Consult: Acute kidney injury            History of Present Illness: Kaitlin Wagner is a 76 y.o.  female with a past medical history of GERD, hypertension, diabetes, and hyperlipidemia, who was admitted to [facility] on 07/31/2021 for Acute renal failure (ARF) ([place]) [N17.9] AKI (acute kidney injury) [facility]) [N17.9]  Kaitlin Wagner presents to the emergency department from primary care with abnormal labs.  Outpatient lab work indicated elevated BUN and creatinine.  Kaitlin Wagner seen resting in bed quietly, extremely drowsy.  No family at bedside.  Chart reviewed review Kaitlin Wagner currently lives at home with daughter after a short stay in a nursing home.  Daughter reports the respite care in the previous week for 4 days.  Daughter reports decreased appetite and Ensure supplementation.  Daughter reports Kaitlin Wagner has become increasingly weak and requires a wheelchair, previously ambulatory with walker.  Denies nausea, vomiting, and diarrhea.  Denies fever, cough, shortness of breath.  Daughter reports 1 dark stool.  Reports Kaitlin Wagner has history of rectal fissure causing occasional rectal bleeding.  Lab work on ED arrival include potassium 5.7, CO2 12, BUN to 36, creatinine 13.6 with GFR 3, alkaline phos 127.  CT abdomen showed bilateral renal calculi without obstruction with gas-filled dilated loops of bowel may indicate ileus.  Respiratory panel negative for flu and COVID-19.  We have been consulted to evaluate renal function   Medications: Outpatient medications: Medications Prior to Admission  Medication Sig Dispense Refill Last Dose   aspirin EC 81 MG tablet Take 81 mg by mouth daily.   07/31/2021 at 0800   escitalopram  (LEXAPRO) 5 MG tablet Take 5 mg by mouth daily.   07/31/2021 at 0800   esomeprazole (NEXIUM) 10 MG packet Take 10 mg by mouth daily before breakfast.   07/30/2021 at 0800   Prenatal Vit-Fe Fumarate-FA (PREPLUS) 27-1 MG TABS Take 1 tablet by mouth daily.   07/31/2021 at 0800   Acetaminophen Childrens 160 MG/5ML SOLN Take 20 mLs by mouth 3 (three) times daily as needed for pain.   prn at unknown   chlorhexidine (PERIDEX) 0.12 % solution Use as directed 15 mLs in the mouth or throat daily.   prn at unknown   Cholecalciferol 25 MCG (1000 UT) tablet Take 1,000 Units by mouth daily. (Kaitlin Wagner not taking: Reported on 07/31/2021)   Not Taking   feeding supplement (ENSURE ENLIVE / ENSURE PLUS) LIQD Take 237 mLs by mouth 2 (two) times daily between meals. 237 mL 0     Current medications: Current Facility-Administered Medications  Medication Dose Route Frequency Provider Last Rate Last Admin   acetaminophen (TYLENOL) tablet 650 mg  650 mg Oral Q6H PRN Kristopher Oppenheim, DO       Or   acetaminophen (TYLENOL) suppository 650 mg  650 mg Rectal Q6H PRN Kristopher Oppenheim, DO       Chlorhexidine Gluconate Cloth 2 % PADS 6 each  6 each Topical Daily Kristopher Oppenheim, DO   6 each at 08/01/21 0840   pantoprazole (PROTONIX) EC tablet 20 mg  20 mg Oral Daily Kristopher Oppenheim, DO   20 mg at 08/01/21 4496   sodium bicarbonate 150 mEq in dextrose 5 % 1,150 mL infusion   Intravenous Continuous Colon Flattery, NP 100 mL/hr at 08/01/21 1138 New Bag at 08/01/21 1138   sodium bicarbonate tablet 650 mg  650 mg Oral TID Kristopher Oppenheim, DO   650 mg at 08/01/21 0840      Allergies: No Known Allergies    Past Medical History: Past Medical History:  Diagnosis Date   Cancer ([place]) colon   Diabetes mellitus without complication ([place])    GERD (gastroesophageal reflux disease)    Hyperlipidemia    Hypertension      Past Surgical History: Past Surgical History:  Procedure Laterality Date   CHOLECYSTECTOMY     colon resection     laparoscopic    COLON SURGERY     COLONOSCOPY WITH PROPOFOL N/A 05/12/2015   Procedure: COLONOSCOPY WITH PROPOFOL;  Surgeon: Hulen Luster, MD;  Location: [facility] ENDOSCOPY;  Service: Gastroenterology;  Laterality: N/A;   COLONOSCOPY WITH PROPOFOL N/A 05/12/2018   Procedure: COLONOSCOPY WITH PROPOFOL;  Surgeon: Toledo, Benay Pike, MD;  Location: [facility] ENDOSCOPY;  Service: Gastroenterology;  Laterality: N/A;   ESOPHAGOGASTRODUODENOSCOPY N/A 05/12/2015   Procedure: ESOPHAGOGASTRODUODENOSCOPY (EGD);  Surgeon: Hulen Luster, MD;  Location: [facility] ENDOSCOPY;  Service: Gastroenterology;  Laterality: N/A;     Family History: History reviewed. No pertinent family history.   Social History: Social History   Socioeconomic History   Marital status: Divorced    Spouse name: Not on file   Number of children: Not on file   Years of education: Not on file   Highest education level: Not on file  Occupational History   Not on file  Tobacco Use   Smoking status: Never   Smokeless tobacco: Never  Vaping Use   Vaping Use: Never used  Substance and Sexual Activity   Alcohol use: No   Drug use: No   Sexual activity: Not on file  Other Topics Concern   Not on file  Social History Narrative   Not on file   Social Determinants of Health   Financial Resource Strain: Not on file  Food Insecurity: Not on file  Transportation Needs: Not on file  Physical Activity: Not on file  Stress: Not on file  Social Connections: Not on file  Intimate Partner Violence: Not on file     Review of Systems: Review of Systems  Constitutional:  Negative for chills, fever and malaise/fatigue.  HENT:  Negative for congestion, sore throat and tinnitus.   Eyes:  Negative for blurred vision and redness.  Respiratory:  Negative for cough, shortness of breath and wheezing.   Cardiovascular:  Negative for chest pain, palpitations, claudication and leg swelling.  Gastrointestinal:  Negative for abdominal pain, blood in stool, diarrhea, nausea  and vomiting.  Genitourinary:  Negative for flank pain, frequency and hematuria.  Musculoskeletal:  Negative for back pain, falls and myalgias.  Skin:  Negative for rash.  Neurological:  Positive for weakness. Negative for dizziness and headaches.  Endo/Heme/Allergies:  Does not bruise/bleed easily.  Psychiatric/Behavioral:  Negative for depression. The Kaitlin Wagner is not nervous/anxious and does not have insomnia.    Vital Signs: Blood pressure (!) 128/48, pulse 93, temperature 98.8 F (37.1 C), temperature source Axillary, resp. rate 16, height 5\' 6"  (1.676  m), weight 52.7 kg, SpO2 99 %.  Weight trends: Filed Weights   07/31/21 1021 07/31/21 2338  Weight: 65.8 kg 52.7 kg    Physical Exam: General: NAD, somnolent  Head: Normocephalic, atraumatic. Moist oral mucosal membranes  Eyes: Anicteric  Lungs:  Clear to auscultation, normal effort  Heart: Regular rate and rhythm  Abdomen:  Soft, nontender,   Extremities: Trace peripheral edema.  Neurologic: Somnolent, moving all four extremities  Skin: No lesions  Access: No dialysis access     Lab results: Basic Metabolic Panel: Recent Labs  Lab 07/31/21 1035 08/01/21 0417  NA 138 141  K 5.7* 5.1  CL 106 113*  CO2 12* 11*  GLUCOSE 122* 78  BUN 236* 254*  CREATININE 13.62* 13.17*  CALCIUM 9.0 8.1*  MG  --  3.4*    Liver Function Tests: Recent Labs  Lab 07/31/21 1035 08/01/21 0417  AST 34 25  ALT 47* 34  ALKPHOS 127* 104  BILITOT 0.5 0.8  PROT 8.0 6.4*  ALBUMIN 3.8 3.1*   No results for input(s): LIPASE, AMYLASE in the last 168 hours. No results for input(s): AMMONIA in the last 168 hours.  CBC: Recent Labs  Lab 07/31/21 1035 08/01/21 0417  WBC 10.3 7.6  NEUTROABS  --  6.2  HGB 13.0 10.5*  HCT 37.9 29.9*  MCV 85.0 86.4  PLT 200 159    Cardiac Enzymes: No results for input(s): CKTOTAL, CKMB, CKMBINDEX, TROPONINI in the last 168 hours.  BNP: Invalid input(s): POCBNP  CBG: Recent Labs  Lab  07/31/21 2047  GLUCAP 111*    Microbiology: Results for orders placed or performed during the hospital encounter of 07/31/21  Resp Panel by RT-PCR (Flu A&B, Covid) Nasopharyngeal Swab     Status: None   Collection Time: 07/31/21  3:01 PM   Specimen: Nasopharyngeal Swab; Nasopharyngeal(NP) swabs in vial transport medium  Result Value Ref Range Status   SARS Coronavirus 2 by RT PCR NEGATIVE NEGATIVE Final    Comment: (NOTE) SARS-CoV-2 target nucleic acids are NOT DETECTED.  The SARS-CoV-2 RNA is generally detectable in upper respiratory specimens during the acute phase of infection. The lowest concentration of SARS-CoV-2 viral copies this assay can detect is 138 copies/mL. A negative result does not preclude SARS-Cov-2 infection and should not be used as the sole basis for treatment or other Kaitlin Wagner management decisions. A negative result may occur with  improper specimen collection/handling, submission of specimen other than nasopharyngeal swab, presence of viral mutation(s) within the areas targeted by this assay, and inadequate number of viral copies(<138 copies/mL). A negative result must be combined with clinical observations, Kaitlin Wagner history, and epidemiological information. The expected result is Negative.  Fact Sheet for Patients:  EntrepreneurPulse.com.au  Fact Sheet for Healthcare Providers:  IncredibleEmployment.be  This test is no t yet approved or cleared by the [place] FDA and  has been authorized for detection and/or diagnosis of SARS-CoV-2 by FDA under an Emergency Use Authorization (EUA). This EUA will remain  in effect (meaning this test can be used) for the duration of the COVID-19 declaration under Section 564(b)(1) of the Act, 21 U.S.C.section 360bbb-3(b)(1), unless the authorization is terminated  or revoked sooner.       Influenza A by PCR NEGATIVE NEGATIVE Final   Influenza B by PCR NEGATIVE NEGATIVE Final     Comment: (NOTE) The Xpert Xpress SARS-CoV-2/FLU/RSV plus assay is intended as an aid in the diagnosis of influenza from Nasopharyngeal swab specimens and should not be used  as a sole basis for treatment. Nasal washings and aspirates are unacceptable for Xpert Xpress SARS-CoV-2/FLU/RSV testing.  Fact Sheet for Patients: EntrepreneurPulse.com.au  Fact Sheet for Healthcare Providers: IncredibleEmployment.be  This test is not yet approved or cleared by the [place] FDA and has been authorized for detection and/or diagnosis of SARS-CoV-2 by FDA under an Emergency Use Authorization (EUA). This EUA will remain in effect (meaning this test can be used) for the duration of the COVID-19 declaration under Section 564(b)(1) of the Act, 21 U.S.C. section 360bbb-3(b)(1), unless the authorization is terminated or revoked.  Performed at [facility], [address] [place] [address], [place] [address]     Coagulation Studies: No results for input(s): LABPROT, INR in the last 72 hours.  Urinalysis: Recent Labs    07/31/21 1352  COLORURINE YELLOW  LABSPEC 1.010  PHURINE 5.0  GLUCOSEU NEGATIVE  HGBUR TRACE*  BILIRUBINUR NEGATIVE  KETONESUR NEGATIVE  PROTEINUR NEGATIVE  NITRITE NEGATIVE  LEUKOCYTESUR SMALL*      Imaging: CT ABDOMEN PELVIS WO CONTRAST  Result Date: 07/31/2021 CLINICAL DATA:  Acute renal failure. EXAM: CT ABDOMEN AND PELVIS WITHOUT CONTRAST TECHNIQUE: Multidetector CT imaging of the abdomen and pelvis was performed following the standard protocol without IV contrast. RADIATION DOSE REDUCTION: This exam was performed according to the departmental dose-optimization program which includes automated exposure control, adjustment of the mA and/or kV according to Kaitlin Wagner size and/or use of iterative reconstruction technique. COMPARISON:  10/20/2020 FINDINGS: Lower chest: Motion artifact at the lung bases. No large pleural effusion.  Hepatobiliary: Cholecystectomy.  Normal appearance of the liver. Pancreas: Unremarkable. No pancreatic ductal dilatation or surrounding inflammatory changes. Spleen: Normal in size without focal abnormality. Adrenals/Urinary Tract: Normal adrenal glands. 8 mm stone in left kidney lower pole without hydronephrosis. Normal appearance of the urinary bladder with small to moderate amount of fluid. 2 mm stone in the right kidney lower pole without hydronephrosis. Motion artifact throughout the abdomen. Stomach/Bowel: Again noted are at least 2 areas with bowel anastomosis. Dilated gas-filled loops of bowel throughout the anterior aspect of the abdomen. Findings are similar to the exam from 10/20/2020. No focal bowel inflammation. Vascular/Lymphatic: Diffuse atherosclerotic calcifications in the aorta and iliac arteries. Negative for abdominal aortic aneurysm. No significant lymph node enlargement in the abdomen or pelvis. Lymph node along the right external iliac nodal chain on sequence 2 image 61 measures 8 mm in the short axis and stable. Reproductive: Uterus and bilateral adnexa are unremarkable. Other: Again noted is laxity along the anterior abdominal wall with minimal subcutaneous fat along the anterior abdominal wall. Findings along the anterior abdominal wall are likely postoperative in etiology. Negative for ascites. Negative for free air. Musculoskeletal: Extensive heterogeneity in the lumbar spine is similar to the previous examination. Partial healing of the sacral decubitus ulcer. There continues to be a small ulceration in this area. IMPRESSION: 1. Bilateral renal calculi without hydronephrosis. 2. Study has limitations from excessive motion artifact. 3. Gas-filled dilated loops of bowel throughout the abdomen. Postoperative bowel changes. Bowel gas distension is nonspecific but could represent an ileus. Limited evaluation due to extensive motion artifact on this examination. 4. Sacral decubitus ulceration  has partially healed since 10/20/2020. 5.  Aortic Atherosclerosis (ICD10-I70.0). Electronically Signed   By: Markus Daft M.D.   On: 07/31/2021 13:15   US RENAL  Result Date: 08/01/2021 CLINICAL DATA:  Acute renal failure EXAM: RENAL / URINARY TRACT ULTRASOUND COMPLETE COMPARISON:  CT abdomen 07/31/2021 FINDINGS: Right Kidney: Renal measurements: 10.4 x 4.7  x 4.2 cm = volume: 108 solid mL. Echogenicity within normal limits. No solid mass or hydronephrosis visualized. 1.2 x 1.5 x 1.8 cm right renal cyst along the upper pole. Left Kidney: Renal measurements: 11.4 x 4.6 x 4.8 cm = volume: 132 mL. Echogenicity within normal limits. No mass or hydronephrosis visualized. Bladder: Bladder is unremarkable with a Foley catheter present. Other: None. IMPRESSION: 1. No obstructive uropathy. Electronically Signed   By: Kathreen Devoid M.D.   On: 08/01/2021 10:33     Assessment & Plan: Ms. Shevy Yaney is a 76 y.o.  female with a past medical history of GERD, hypertension, diabetes, and hyperlipidemia, who was admitted to [facility] on 07/31/2021 for Acute renal failure (ARF) ([place]) [N17.9] AKI (acute kidney injury) ([place]) [N17.9]  Acute Kidney Injury on chronic kidney disease stage IV with baseline creatinine 1.5 and GFR of 33 on 014/26/22.  Acute kidney injury secondary to dehydration, Kaitlin Wagner appears hypovolemic Chronic kidney disease risk factors include diabetes and hypertension Renal ultrasound negative for obstruction No IV contrast exposure.  No indication for dialysis at this time.   Agree with IV fluids for rehydration.  We will continue to monitor Kaitlin Wagner closely and evaluate need for dialysis evaluation.  Avoid nephrotoxic agents and therapies if possible.  Avoid hypotension  2. Metabolic acidosis.IVF changed to sodium bicarb   3.  Anemia of chronic kidney disease Lab Results  Component Value Date   HGB 10.5 (L) 08/01/2021  Hemoglobin remains within target.    4. Diabetes mellitus type II with chronic  kidney disease.  Non-insulin-dependent. Glucose stable.    LOS: [place] 2/1/20232:43 PM

## 2021-08-01 NOTE — Assessment & Plan Note (Signed)
-   WOC °

## 2021-08-01 NOTE — Assessment & Plan Note (Signed)
CT showed distended bowel loops. No vomiting or pain to suggest symptomatic ileus, but I would expect one with a BUN >200.  - Continue IV fluids

## 2021-08-01 NOTE — Assessment & Plan Note (Signed)
See above baseline.  Here, she is somnolent due to uremia.

## 2021-08-01 NOTE — Assessment & Plan Note (Signed)
BP normal off meds 

## 2021-08-01 NOTE — Consult Note (Signed)
Consultation Note Date: 08/01/2021   Patient Name: Kaitlin Wagner  DOB: May 21, 1946  MRN: [id number]  Age / Sex: 76 y.o., female  PCP: Marnee Guarneri, MD Referring Physician: Edwin Dada, *  Reason for Consultation: Establishing goals of care  HPI/Patient Profile: 76 y.o. female  with past medical history of dementia, hypertension, hyperlipidemia, Barretts esophagus, CKD stage 3b, GERD, bowel cancer, sacral decubitus, rectal fissures, depression admitted on 07/31/2021 with acute renal failure after recent temporary placement for respite 07/19/21-07/22/21 followed by decreased appetite/intake and weakness. Also noted that she is enrolled with [facility] program.   Clinical Assessment and Goals of Care: I met today at Kaitlin Wagner's bedside along with daughter, [name]. Ms. Lovina sleeps throughout my visit and she will nod her head yes or no if I awaken her and ask a question then she goes right back to sleep. Kaitlin Wagner is one of 4 daughters (another daughter also lives with them and assists with care). Kaitlin Wagner shares that her mother's decline has been very sudden although she has noted gradual decline over time. Her mother has lives with her for 10 years now and she goes to [facility]ACE in day time 5 times weekly. Prior to past few days she was mostly in wheelchair but able to walk with walker very short distances. She reports that her mother previously had good appetite at home but when at the facility for respite they tried to give her pureed food which she would not eat. Kaitlin Wagner says that when her mother returned back home she began eating and drinking again as usual so she was surprised when she declined.   We spent much time discussing Ms. Domenique prior to dementia and her health decline and she was very independent and hard working. She put 4 daughters through private school. We reflected on Ms. Diora's values as a person and what  quality of life she would be happy with. Kaitlin Wagner shares that this is the conversation she has been having with her sister. Kaitlin Wagner shares that if it were her that she would not want to be prolonged in this state (she notes that she is much like her mother). However, she struggles with the knowledge that her mother has previously bounced back much better than expected. She also notes other family that are more hopeful for improvement. Kaitlin Wagner confirms DNR and confirms she would never want outpatient dialysis for her mother. At this time she is conflicted if she would want to pursue dialysis in the hospital.   We discussed the different scenarios of not pursuing dialysis and having hospice at home vs hospice facility. We discussed the various scenarios of pursuing dialysis and that she may not tolerate, it may improve her temporarily but she may decline when stopped, and chances that even if she were to improve from this acute state with short trial of hospital dialysis that she is at high risk for something like this happening again. We also discussed the importance of keeping in mind the expected trajectory of her dementia and  knowledge that her health will continue to worsen (as will her quality of life) with progression of her disease process.   All questions/concerns addressed. Emotional support provided.   Primary Decision Maker NEXT OF KIN daughter Kaitlin Wagner and makes decisions with her sisters    SUMMARY OF RECOMMENDATIONS   - DNR - No desire for outpatient dialysis - Undecided about short term trial of hospital dialysis at this time - I will continue to follow and support  Code Status/Advance Care Planning: DNR   Symptom Management:  Per attending, nephrology.   Palliative Prophylaxis:  Aspiration, Bowel Regimen, Delirium Protocol, Frequent Pain Assessment, Oral Care, and Turn Reposition  Psycho-social/Spiritual:  Desire for further Chaplaincy support:yes Additional  Recommendations: Education on Hospice and Grief/Bereavement Support  Prognosis:  Overall prognosis poor with kidney failure and underlying dementia.   Discharge Planning: To Be Determined      Primary Diagnoses: Present on Admission:  Acute renal failure (ARF) ([place])  Advanced dementia  GERD (gastroesophageal reflux disease)  Depression  Pressure injury of right buttock, stage 2 ([place])   I have reviewed the medical record, interviewed the patient and family, and examined the patient. The following aspects are pertinent.  Past Medical History:  Diagnosis Date   Cancer ([place]) colon   Diabetes mellitus without complication ([facility])    GERD (gastroesophageal reflux disease)    Hyperlipidemia    Hypertension    Social History   Socioeconomic History   Marital status: Divorced    Spouse name: Not on file   Number of children: Not on file   Years of education: Not on file   Highest education level: Not on file  Occupational History   Not on file  Tobacco Use   Smoking status: Never   Smokeless tobacco: Never  Vaping Use   Vaping Use: Never used  Substance and Sexual Activity   Alcohol use: No   Drug use: No   Sexual activity: Not on file  Other Topics Concern   Not on file  Social History Narrative   Not on file   Social Determinants of Health   Financial Resource Strain: Not on file  Food Insecurity: Not on file  Transportation Needs: Not on file  Physical Activity: Not on file  Stress: Not on file  Social Connections: Not on file   No family history on file. Scheduled Meds:  Chlorhexidine Gluconate Cloth  6 each Topical Daily   heparin  5,000 Units Subcutaneous Q8H   pantoprazole  20 mg Oral Daily   sodium bicarbonate  650 mg Oral TID   Continuous Infusions:  sodium bicarbonate 150 mEq in D5W infusion     PRN Meds:.acetaminophen **OR** acetaminophen No Known Allergies Review of Systems  Unable to perform ROS: Acuity of condition   Physical Exam Vitals  and nursing note reviewed.  Constitutional:      General: She is not in acute distress.    Appearance: She is ill-appearing.  Cardiovascular:     Rate and Rhythm: Normal rate.  Pulmonary:     Effort: No tachypnea, accessory muscle usage or respiratory distress.  Abdominal:     Palpations: Abdomen is soft.  Neurological:     Mental Status: She is lethargic.    Vital Signs: BP (!) 128/48 (BP Location: Right Arm)    Pulse 93    Temp 98.7 F (37.1 C)    Resp 16    Ht 5' 6"  (1.676 m)    Wt 52.7 kg  SpO2 99%    BMI 18.75 kg/m  Pain Scale: 0-10   Pain Score: 0-No pain   SpO2: SpO2: 99 % O2 Device:SpO2: 99 % O2 Flow Rate: .   IO: Intake/output summary:  Intake/Output Summary (Last 24 hours) at 08/01/2021 0913 Last data filed at 08/01/2021 0502 Gross per 24 hour  Intake 621.67 ml  Output 620 ml  Net 1.67 ml    LBM: Last BM Date: 08/01/21 Baseline Weight: Weight: 65.8 kg Most recent weight: Weight: 52.7 kg     Palliative Assessment/Data:     Total time: 80 min  Greater than 50%  of this time was spent counseling and coordinating care related to the above assessment and plan.  Signed by: Vinie Sill, NP Palliative Medicine Team Pager # [phone] (M-F 8a-5p) Team Phone # [phone] (Nights/Weekends)

## 2021-08-01 NOTE — Progress Notes (Signed)
Initial Nutrition Assessment  DOCUMENTATION CODES:  Severe malnutrition in context of chronic illness  INTERVENTION:  Add Nepro Shake po TID, each supplement provides 425 kcal and 19 grams protein.  Add Rena-Vite daily.  Encourage PO and supplement intake.  Recommend feeding assistance with meals.  NUTRITION DIAGNOSIS:  Severe Malnutrition related to chronic illness (dementia) as evidenced by severe fat depletion, severe muscle depletion, energy intake < or equal to 50% for > or equal to 5 days.  GOAL:  Patient will meet greater than or equal to 90% of their needs  MONITOR:  PO intake, Supplement acceptance, Labs, Weight trends, Skin, I & O's  REASON FOR ASSESSMENT:  Malnutrition Screening Tool    ASSESSMENT:  76 yo female with a PMH of dementia, hypertension, hyperlipidemia, Barretts esophagus, CKD stage 3b, GERD, bowel cancer, sacral decubitus, rectal fissures, depression admitted on 07/31/2021 with acute renal failure after recent temporary placement for respite 07/19/21-07/22/21 followed by decreased appetite/intake and weakness.  Spoke with daughter at bedside. Pt asleep soundly. Daughter reports that pt was eating well up until a week ago when her intake decreased.  Per Epic, pt ate 0% of breakfast and had only a few bites of lunch per RD observation.  No recent weight history in Epic or Care Everywhere. Daughter reports weight stability recently.  Pt may benefit from feeding assistance given significant lethargy. RD to order.  Medications: reviewed; Protonix, Na Bicarb TID  Labs: reviewed;  BUN 254 (H), Crt 13.17 (H), Mag 3.4 (H), CBG 111 (H)  NUTRITION - FOCUSED PHYSICAL EXAM: Flowsheet Row Most Recent Value  Orbital Region Severe depletion  Upper Arm Region Moderate depletion  Thoracic and Lumbar Region Severe depletion  Buccal Region Severe depletion  Temple Region Severe depletion  Clavicle Bone Region Severe depletion  Clavicle and Acromion Bone Region  Severe depletion  Scapular Bone Region Unable to assess  Dorsal Hand Severe depletion  Patellar Region Severe depletion  Anterior Thigh Region Moderate depletion  Posterior Calf Region Severe depletion  Edema (RD Assessment) None  Hair Reviewed  Eyes Unable to assess  Mouth Reviewed  [Missing a few teeth]  Skin Reviewed  Nails Reviewed   Diet Order:   Diet Order             Diet renal with fluid restriction Fluid restriction: 2000 mL Fluid; Room service appropriate? Yes; Fluid consistency: Thin  Diet effective now                  EDUCATION NEEDS:  Education needs have been addressed  Skin:  Skin Assessment: Skin Integrity Issues: Skin Integrity Issues:: Stage II Stage II: Buttocks  Last BM:  08/01/21  Height:  Ht Readings from Last 1 Encounters:  07/31/21 5\' 6"  (1.676 m)   Weight:  Wt Readings from Last 1 Encounters:  07/31/21 52.7 kg   BMI:  Body mass index is 18.75 kg/m.  Estimated Nutritional Needs:  Kcal:  2000-2200 Protein:  75-90 grams Fluid:  >2 L  Derrel Nip, RD, LDN (she/her/hers) Clinical Inpatient Dietitian RD Pager/After-Hours/Weekend Pager # in [place]

## 2021-08-02 DIAGNOSIS — Z7189 Other specified counseling: Secondary | ICD-10-CM | POA: Diagnosis not present

## 2021-08-02 DIAGNOSIS — Z515 Encounter for palliative care: Secondary | ICD-10-CM | POA: Diagnosis not present

## 2021-08-02 DIAGNOSIS — N179 Acute kidney failure, unspecified: Secondary | ICD-10-CM | POA: Diagnosis not present

## 2021-08-02 LAB — BASIC METABOLIC PANEL
Anion gap: 16 — ABNORMAL HIGH (ref 5–15)
BUN: 224 mg/dL — ABNORMAL HIGH (ref 8–23)
CO2: 19 mmol/L — ABNORMAL LOW (ref 22–32)
Calcium: 7.7 mg/dL — ABNORMAL LOW (ref 8.9–10.3)
Chloride: 109 mmol/L (ref 98–111)
Creatinine, Ser: 12.78 mg/dL — ABNORMAL HIGH (ref 0.44–1.00)
GFR, Estimated: 3 mL/min — ABNORMAL LOW (ref >60)
Glucose, Bld: 115 mg/dL — ABNORMAL HIGH (ref 70–99)
Potassium: 3.4 mmol/L — ABNORMAL LOW (ref 3.5–5.1)
Sodium: 144 mmol/L (ref 135–145)

## 2021-08-02 MED ORDER — POTASSIUM CHLORIDE 20 MEQ PO PACK
20.0000 meq | PACK | ORAL | Status: AC
  Administered 2021-08-02 (×2): 20 meq via ORAL
  Filled 2021-08-02 (×2): qty 1

## 2021-08-02 MED ORDER — POTASSIUM CHLORIDE CRYS ER 20 MEQ PO TBCR
20.0000 meq | EXTENDED_RELEASE_TABLET | Freq: Two times a day (BID) | ORAL | Status: DC
  Filled 2021-08-02: qty 1

## 2021-08-02 NOTE — Evaluation (Signed)
Physical Therapy Evaluation Patient Details Name: Kaitlin Wagner MRN: [id number] DOB: Nov 09, 1945 Today's Date: 08/02/2021  History of Present Illness  Pt is a 76 y.o. F  who was sent to ER by PCP for renal failure. Recent SNF stay, and per family pt with functional decline. PMH of advanced dementia, HTN, and CKD IIIb, sacral wound, HLD, DM2, TIA.   Clinical Impression  Patient lethargic initially, but did become more alert, eyes open during session, disoriented x4. Per family the patient was ambulating short household distances with a walker, but predominantly utilized WC now.  Chart reviewed from 9 months ago references pt staying at [facility] ~70months with intention to return home if able.   The patient was able to follow simple commands intermittently. Noted wet linens despite foley, and bed change/linen change and pericare provided. MaxAx2 for rolling R and L, with extended cueing, time and hand over hand the patient was able to reach and hold onto bed rails, and very minimally able to participate in bed mobility. Breakfast arrival and RN in room, pt set up for comfort. Pt placed on trial PT services 3-5 visits to continue to assess patients ability/appropriateness to participate with rehab services. Recommendation at this time is a transition to higher levels of care (LTC, home with caregivers to provide physical assist,c etc) with supervision/assistance 24/7 for all mobility/ADLs.        Recommendations for follow up therapy are one component of a multi-disciplinary discharge planning process, led by the attending physician.  Recommendations may be updated based on patient status, additional functional criteria and insurance authorization.  Follow Up Recommendations Other (comment) (higher levels of care with 24/7 supervision/assistance for all ADLs/mobility)    Assistance Recommended at Discharge Frequent or constant Supervision/Assistance  Patient can return home with the following   Two people to help with walking and/or transfers;Two people to help with bathing/dressing/bathroom;Direct supervision/assist for medications management;Help with stairs or ramp for entrance;Assist for transportation;Assistance with feeding;Assistance with cooking/housework;Direct supervision/assist for financial management    Equipment Recommendations Other (comment) (TBD, anticipate hoyer lift if returning home)  Recommendations for Other Services       Functional Status Assessment  (per chart family reported a decline in functional mobility recently)     Precautions / Restrictions Precautions Precautions: Fall Restrictions Weight Bearing Restrictions: No      Mobility  Bed Mobility Overal bed mobility: Needs Assistance Bed Mobility: Rolling Rolling: Max assist, +2 for physical assistance         General bed mobility comments: with cues, hand over hand assistance pt able to reach for bed rails, and hold during pericare. did initiate rolling back into supine, ultimately maxAx2    Transfers                        Ambulation/Gait                  Stairs            Wheelchair Mobility    Modified Rankin (Stroke Patients Only)       Balance                                             Pertinent Vitals/Pain Pain Assessment Pain Assessment: Faces Faces Pain Scale: Hurts a little bit Pain Location: pt unable to state Pain Descriptors /  Indicators: Grimacing Pain Intervention(s): Monitored during session, Limited activity within patient's tolerance, Repositioned    Home Living                     Additional Comments: Per admission 9 months ago, pt was a [facility]ACE participant had been at [facility] for 6 months    Prior Function Prior Level of Function : Patient poor historian/Family not available             Mobility Comments: per chart pt is able to walk short distances with walker, predominantly WC.        Hand Dominance        Extremity/Trunk Assessment   Upper Extremity Assessment Upper Extremity Assessment: Defer to OT evaluation;Generalized weakness    Lower Extremity Assessment Lower Extremity Assessment: Generalized weakness (pt unable/unwilling to bend knees)       Communication      Cognition Arousal/Alertness: Awake/alert Behavior During Therapy: Flat affect Overall Cognitive Status: No family/caregiver present to determine baseline cognitive functioning                                 General Comments: pt disoriented x4, pt reported she lives with her mother. able to follow intermittent one step commands with extended time        General Comments      Exercises Other Exercises Other Exercises: Rolled R and L in bed, pericare total A, linens changed due to urine (though foley in place)   Assessment/Plan    PT Assessment Patient needs continued PT services (Pt placed on trial of PT services pending pt appropriateness/ability to participate with therapy services)  PT Problem List Decreased strength;Decreased mobility;Decreased activity tolerance;Decreased balance;Decreased skin integrity;Decreased knowledge of use of DME;Decreased range of motion;Decreased safety awareness       PT Treatment Interventions DME instruction;Therapeutic exercise;Gait training;Balance training;Neuromuscular re-education;Wheelchair mobility training;Functional mobility training;Cognitive remediation;Therapeutic activities;Patient/family education    PT Goals (Current goals can be found in the Care Plan section)  Acute Rehab PT Goals PT Goal Formulation: Patient unable to participate in goal setting Time For Goal Achievement: 08/16/21 Potential to Achieve Goals: Poor    Frequency Min 2X/week     Co-evaluation               AM-PAC PT "6 Clicks" Mobility  Outcome Measure Help needed turning from your back to your side while in a flat bed without using  bedrails?: Total Help needed moving from lying on your back to sitting on the side of a flat bed without using bedrails?: Total Help needed moving to and from a bed to a chair (including a wheelchair)?: Total Help needed standing up from a chair using your arms (e.g., wheelchair or bedside chair)?: Total Help needed to walk in hospital room?: Total Help needed climbing 3-5 steps with a railing? : Total 6 Click Score: 6    End of Session   Activity Tolerance: Patient limited by lethargy Patient left: in bed;with call bell/phone within reach;with nursing/sitter in room;with bed alarm set;with SCD's reapplied Nurse Communication: Mobility status PT Visit Diagnosis: Other abnormalities of gait and mobility (R26.89);Difficulty in walking, not elsewhere classified (R26.2);Muscle weakness (generalized) (M62.81)    Time: 2505-3976 PT Time Calculation (min) (ACUTE ONLY): 18 min   Charges:   PT Evaluation $PT Eval Moderate Complexity: 1 Mod PT Treatments $Therapeutic Activity: 8-22 mins       Beverlee Nims  Megan Salon PT, DPT 11:08 AM,08/02/21

## 2021-08-02 NOTE — Evaluation (Signed)
Occupational Therapy Evaluation Patient Details Name: Kaitlin Wagner MRN: [id number] DOB: 11-22-1945 Today's Date: 08/02/2021   History of Present Illness Pt is a 76 y.o. F  who was sent to ER by PCP for renal failure. Recent SNF stay, and per family pt with functional decline. PMH of advanced dementia, HTN, and CKD IIIb, sacral wound, HLD, DM2, TIA.   Clinical Impression   Patient presenting with decreased Ind in self care, balance, functional mobility/transfers, endurance, and safety awareness. Patient is not reliable historian and is oriented to self only this session. No family present to confirm baseline. Per chart review, family reports pt was able to stand and transfer into wheelchair ~ 1 month ago. Pt needing total A for all self care tasks and bed mobility. OT to trial therapy sessions for appropriateness of therapeutic interventions. If pt is able to provide this level of care at home pt may return with additional equipment and [place] or may be long term care candidate if this is established to be new baseline. Patient will benefit from acute OT to increase overall independence in the areas of ADLs, functional mobility, and safety awareness in order to safely discharge.     Recommendations for follow up therapy are one component of a multi-disciplinary discharge planning process, led by the attending physician.  Recommendations may be updated based on patient status, additional functional criteria and insurance authorization.   Follow Up Recommendations  Home health OT (Pt is likely not appropriate short term rehab candidate.)    Assistance Recommended at Discharge Frequent or constant Supervision/Assistance  Patient can return home with the following Two people to help with walking and/or transfers;Two people to help with bathing/dressing/bathroom;Assistance with cooking/housework;Assistance with feeding;Direct supervision/assist for medications management;Direct supervision/assist for  financial management;Assist for transportation;Help with stairs or ramp for entrance    Functional Status Assessment  Patient has had a recent decline in their functional status and/or demonstrates limited ability to make significant improvements in function in a reasonable and predictable amount of time  Equipment Recommendations  Other (comment) (Pt will likely need hoyer lift and hospital bed if returning home)       Precautions / Restrictions Precautions Precautions: Fall Restrictions Weight Bearing Restrictions: No      Mobility Bed Mobility Overal bed mobility: Needs Assistance Bed Mobility: Rolling Rolling: Total assist              Transfers                   General transfer comment: deferred for safety          ADL either performed or assessed with clinical judgement   ADL Overall ADL's : Needs assistance/impaired                                       General ADL Comments: total A to hand over hand assistance for all self care tasks while supine in bed.     Vision Patient Visual Report: No change from baseline              Pertinent Vitals/Pain Pain Assessment Pain Assessment: Faces Faces Pain Scale: Hurts a little bit Pain Location: generalized Pain Descriptors / Indicators: Grimacing Pain Intervention(s): Limited activity within patient's tolerance, Monitored during session, Repositioned     Hand Dominance     Extremity/Trunk Assessment Upper Extremity Assessment Upper Extremity Assessment: RUE deficits/detail;LUE deficits/detail RUE  Deficits / Details: Pt is able to bring hand to mouth but not able to lift UE in air against gravity LUE Deficits / Details: shoulder elevation limited to ~ 60 degrees   Lower Extremity Assessment Lower Extremity Assessment: Generalized weakness       Communication Communication Communication: No difficulties   Cognition Arousal/Alertness: Lethargic Behavior During Therapy:  Flat affect Overall Cognitive Status: No family/caregiver present to determine baseline cognitive functioning                                 General Comments: pt is oriented to self only and answers"yes" to all questions including when asked if she was at home. Pt able to fall 1 step commands inconsistently.                Home Living Family/patient expects to be discharged to:: Private residence Living Arrangements: Children                               Additional Comments: Per admission 9 months ago, pt was a [facility]ACE participant had been at [facility] for 6 months      Prior Functioning/Environment Prior Level of Function : Patient poor historian/Family not available             Mobility Comments: per chart pt is able to walk short distances with walker, predominantly WC. ADLs Comments: unsure of pt involvedment with self care tasks and self feeding        OT Problem List: Decreased strength;Decreased range of motion;Decreased activity tolerance;Impaired balance (sitting and/or standing);Decreased knowledge of use of DME or AE;Decreased safety awareness;Decreased cognition      OT Treatment/Interventions: Self-care/ADL training;Therapeutic exercise;Therapeutic activities;Energy conservation;DME and/or AE instruction;Manual therapy;Modalities;Balance training;Patient/family education;Neuromuscular education    OT Goals(Current goals can be found in the care plan section) Acute Rehab OT Goals Patient Stated Goal: none stated OT Goal Formulation: Patient unable to participate in goal setting Time For Goal Achievement: 08/16/21 Potential to Achieve Goals: Good ADL Goals Pt Will Perform Eating: with mod assist Pt Will Perform Grooming: with mod assist Pt Will Transfer to Toilet: with max assist;bedside commode  OT Frequency: Min 2X/week (OT to trial pt for 2-3 sessions for improvement)       AM-PAC OT "6 Clicks" Daily Activity      Outcome Measure Help from another person eating meals?: A Lot Help from another person taking care of personal grooming?: Total Help from another person toileting, which includes using toliet, bedpan, or urinal?: Total Help from another person bathing (including washing, rinsing, drying)?: Total Help from another person to put on and taking off regular upper body clothing?: Total Help from another person to put on and taking off regular lower body clothing?: Total 6 Click Score: 7   End of Session Nurse Communication: Mobility status  Activity Tolerance: Patient limited by lethargy Patient left: in bed;with call bell/phone within reach;with bed alarm set  OT Visit Diagnosis: Muscle weakness (generalized) (M62.81)                Time: 5035-4656 OT Time Calculation (min): 20 min Charges:  OT General Charges $OT Visit: 1 Visit OT Evaluation $OT Eval Moderate Complexity: 1 Mod OT Treatments $Self Care/Home Management : 8-22 mins  Darleen Crocker, MS, OTR/L , CBIS ascom [phone]  08/02/21, 3:04 PM

## 2021-08-02 NOTE — TOC Initial Note (Signed)
Transition of Care [facility]) - Initial/Assessment Note    Patient Details  Name: Kaitlin Wagner MRN: [id number] Date of Birth: 12-31-1945  Transition of Care [facility]) CM/SW Contact:    Candie Chroman, LCSW Phone Number: 08/02/2021, 12:12 PM  Clinical Narrative:  Per PT note, patient disoriented x 4. Called PACE social worker, Thayer Headings, and notified her of PT recommendations. She said patient comes to their facility 5 days a week and has supervision from her family the rest of the time which equates to 24/7. Notified her of potential need for hoyer lift if returning home. Will continue to follow progress.              Expected Discharge Plan:  (TBD) Barriers to Discharge: Continued Medical Work up   Patient Goals and CMS Choice Patient states their goals for this hospitalization and ongoing recovery are:: Patient not fully oriented.      Expected Discharge Plan and Services Expected Discharge Plan:  (TBD)       Living arrangements for the past 2 months: Single Family Home                                      Prior Living Arrangements/Services Living arrangements for the past 2 months: Single Family Home Lives with:: Adult Children Patient language and need for interpreter reviewed:: Yes        Need for Family Participation in Patient Care: Yes (Comment) Care giver support system in place?: Yes (comment) Current home services: DME Criminal Activity/Legal Involvement Pertinent to Current Situation/Hospitalization: No - Comment as needed  Activities of Daily Living Home Assistive Devices/Equipment: Environmental consultant (specify type), Wheelchair ADL Screening (condition at time of admission) Patient's cognitive ability adequate to safely complete daily activities?: No Is the patient deaf or have difficulty hearing?: No Does the patient have difficulty seeing, even when wearing glasses/contacts?: No Does the patient have difficulty concentrating, remembering, or making decisions?:  No Patient able to express need for assistance with ADLs?: Yes Does the patient have difficulty dressing or bathing?: Yes Independently performs ADLs?: No Communication: Independent Dressing (OT): Dependent Is this a change from baseline?: Change from baseline, expected to last >3 days Grooming: Dependent Is this a change from baseline?: Change from baseline, expected to last >3 days Feeding: Dependent Is this a change from baseline?: Change from baseline, expected to last >3 days Bathing: Dependent Is this a change from baseline?: Change from baseline, expected to last >3 days Toileting: Dependent Is this a change from baseline?: Change from baseline, expected to last >3days In/Out Bed: Dependent Is this a change from baseline?: Change from baseline, expected to last >3 days Walks in Home: Independent with device (comment) Does the patient have difficulty walking or climbing stairs?: Yes Weakness of Legs: Both Weakness of Arms/Hands: Both  Permission Sought/Granted Permission sought to share information with : Facility Arts administrator granted to share info w AGENCY: [facility]ACE        Emotional Assessment Appearance:: Appears stated age Attitude/Demeanor/Rapport: Unable to Assess Affect (typically observed): Unable to Assess Orientation: :  (Per today's PT note, disoriented x 4) Alcohol / Substance Use: Not Applicable Psych Involvement: No (comment)  Admission diagnosis:  Acute renal failure (ARF) ([place]) [N17.9] AKI (acute kidney injury) ([place]) [N17.9] Patient Active Problem List   Diagnosis Date Noted   Pressure injury of right buttock, stage 2 ([place]) 08/01/2021  Stage 3b chronic kidney disease (CKD) ([place]) 08/01/2021   Protein-calorie malnutrition, severe 08/01/2021   Distended abdomen 60/47/9987   Acute metabolic encephalopathy 21/58/7276   Uremia 08/01/2021   Acute renal failure (ARF) ([place]) 07/31/2021   DNR (do not resuscitate)/DNI(Do Not Intubate)  07/31/2021   Primary hypertension    Depression    Advanced dementia 10/20/2020   GERD (gastroesophageal reflux disease) 10/20/2020   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   PCP:  Marnee Guarneri, MD Pharmacy:   [facility], [place] [address] [place] [address] Phone: [phone] Fax: [phone]     Social Determinants of Health (SDOH) Interventions    Readmission Risk Interventions No flowsheet data found.

## 2021-08-02 NOTE — Progress Notes (Signed)
PROGRESS NOTE  Kerline Trahan ERX:[id number] DOB: Mar 13, 1946 DOA: 07/31/2021 PCP: Marnee Guarneri, MD  HPI/Recap of past 24 hours: Mrs. Mcclory is a 76 y.o. F with advanced dementia, HTN, and CKD IIIb who was sent to ER by PCP for renal failure.  Patient recently spent few days in SNF.  On return home, family noted she was less responsive, weaker (requiring wheelchair) and had minimal appetite.  At PCP's office, found to have Cr 13 from baseline 1.6.  She was sent to the ER hospital for further evaluation and management of her acute kidney injury.  Nephrology consulted and following.  She had had minimal improvement of her renal function.  08/02/2021: Patient was seen and examined at her bedside.  She was somnolent, easily arousable, and minimally interactive.  Noted old blood in her nostrils.  Denies any pain.    Assessment/Plan: Principal Problem:   Acute renal failure (ARF) (HCC) Active Problems:   Advanced dementia   GERD (gastroesophageal reflux disease)   Primary hypertension   Depression   Pressure injury of right buttock, stage 2 (HCC)   Stage 3b chronic kidney disease (CKD) (HCC)   Protein-calorie malnutrition, severe   Distended abdomen   Acute metabolic encephalopathy   Uremia  Severe acute renal failure (ARF) ([place])- (present on admission) on CKD 3B Baseline creatinine appears to be 1.5 with GFR 33 from 9 months ago. Presented with creatinine of 13 with GFR of 3. Uremia with BUN greater than 200 Currently on bicarb drip for severe anion gap metabolic acidosis. Followed by nephrology. Continue to avoid nephrotoxic agents, dehydration and hypotension. Continue to closely monitor urine output with strict I's and O's. Trend renal function.  Improved anion gap metabolic acidosis She was started on bicarb drip, continue. Serum bicarb 19 with anion gap of 16 from 11 and 17 respectively. Repeat renal function in the morning.  Anemia of chronic disease in the setting of  advanced renal failure Hemoglobin downtrending 10.5 from 13. Continue to closely monitor H&H Continue to monitor for any evidence of overt bleeding.  Distended abdomen CT showed distended bowel loops. No vomiting or pain to suggest symptomatic ileus.  Resolved hyperkalemia, now hypokalemia Potassium 3.4 from 5.7. Hypokalemia likely contributed by bicarb drip Replete judiciously.  Advanced dementia- (present on admission) At baseline, family report she is oriented to self and family, aware of home surroundings, can converse with them appropriately.  Acute metabolic encephalopathy See above baseline.  Here, she is somnolent due to uremia.   Uremia Epistaxis and gums bleeding noted. -Hold ppx heparin   Protein-calorie malnutrition, severe  Pressure injury of right buttock, stage 2 (HCC)- (present on admission) - WOC Continue local wound care   DNR (do not resuscitate)/DNI(Do Not Intubate) Verified DNR/DNI status with dtr [name].   Depression- (present on admission) -Hold Celexa   Primary hypertension BP normal off meds   GERD (gastroesophageal reflux disease)- (present on admission) -Continue PPI     Critical care time: 65 minutes.     Code Status: DNR  Family Communication: None at bedside  Disposition Plan: To be determined.   Consultants: Nephrology  Procedures: None.  Antimicrobials: None.  DVT prophylaxis: SCDs  Status is: Inpatient Patient requires at least 2 midnights for further evaluation and treatment of present condition.            Objective: Vitals:   08/01/21 2030 08/02/21 0437 08/02/21 0555 08/02/21 0739  BP: (!) 136/56 (!) 132/46  133/69  Pulse: 86 98  98  Resp: 16 20  16   Temp: 98.6 F (37 C) 98.9 F (37.2 C)  99.3 F (37.4 C)  TempSrc: Axillary Axillary    SpO2: 98% 98%  99%  Weight:   64.8 kg   Height:        Intake/Output Summary (Last 24 hours) at 08/02/2021 1315 Last data filed at 08/02/2021 1517 Gross  per 24 hour  Intake 2021.32 ml  Output 850 ml  Net 1171.32 ml   Filed Weights   07/31/21 1021 07/31/21 2338 08/02/21 0555  Weight: 65.8 kg 52.7 kg 64.8 kg    Exam:  General: 76 y.o. year-old female well developed well nourished in no acute distress.  Somnolent but easily arousable to voices. Cardiovascular: Regular rate and rhythm with no rubs or gallops.  No thyromegaly or JVD noted.   Respiratory: Clear to auscultation with no wheezes or rales. Good inspiratory effort. Abdomen: Soft nontender nondistended with normal bowel sounds x4 quadrants. Musculoskeletal: Trace lower extremity edema bilaterally. Skin: No ulcerative lesions noted or rashes, Psychiatry: Mood is appropriate for condition and setting Neuro: Nonfocal exam.   Data Reviewed: CBC: Recent Labs  Lab 07/31/21 1035 08/01/21 0417  WBC 10.3 7.6  NEUTROABS  --  6.2  HGB 13.0 10.5*  HCT 37.9 29.9*  MCV 85.0 86.4  PLT 200 616   Basic Metabolic Panel: Recent Labs  Lab 07/31/21 1035 08/01/21 0417 08/02/21 0426  NA 138 141 144  K 5.7* 5.1 3.4*  CL 106 113* 109  CO2 12* 11* 19*  GLUCOSE 122* 78 115*  BUN 236* 254* 224*  CREATININE 13.62* 13.17* 12.78*  CALCIUM 9.0 8.1* 7.7*  MG  --  3.4*  --    GFR: Estimated Creatinine Clearance: 3.5 mL/min (A) (by C-G formula based on SCr of 12.78 mg/dL (H)). Liver Function Tests: Recent Labs  Lab 07/31/21 1035 08/01/21 0417  AST 34 25  ALT 47* 34  ALKPHOS 127* 104  BILITOT 0.5 0.8  PROT 8.0 6.4*  ALBUMIN 3.8 3.1*   No results for input(s): LIPASE, AMYLASE in the last 168 hours. No results for input(s): AMMONIA in the last 168 hours. Coagulation Profile: No results for input(s): INR, PROTIME in the last 168 hours. Cardiac Enzymes: No results for input(s): CKTOTAL, CKMB, CKMBINDEX, TROPONINI in the last 168 hours. BNP (last 3 results) No results for input(s): PROBNP in the last 8760 hours. HbA1C: No results for input(s): HGBA1C in the last 72  hours. CBG: Recent Labs  Lab 07/31/21 2047  GLUCAP 111*   Lipid Profile: No results for input(s): CHOL, HDL, LDLCALC, TRIG, CHOLHDL, LDLDIRECT in the last 72 hours. Thyroid Function Tests: No results for input(s): TSH, T4TOTAL, FREET4, T3FREE, THYROIDAB in the last 72 hours. Anemia Panel: No results for input(s): VITAMINB12, FOLATE, FERRITIN, TIBC, IRON, RETICCTPCT in the last 72 hours. Urine analysis:    Component Value Date/Time   COLORURINE YELLOW 07/31/2021 [place] 07/31/2021 1352   LABSPEC 1.010 07/31/2021 1352   PHURINE 5.0 07/31/2021 1352   GLUCOSEU NEGATIVE 07/31/2021 1352   HGBUR TRACE (A) 07/31/2021 1352   BILIRUBINUR NEGATIVE 07/31/2021 1352   KETONESUR NEGATIVE 07/31/2021 1352   PROTEINUR NEGATIVE 07/31/2021 1352   NITRITE NEGATIVE 07/31/2021 1352   LEUKOCYTESUR SMALL (A) 07/31/2021 1352   Sepsis Labs: @LABRCNTIP (procalcitonin:4,lacticidven:4)  ) Recent Results (from the past 240 hour(s))  Resp Panel by RT-PCR (Flu A&B, Covid) Nasopharyngeal Swab     Status: None   Collection Time: 07/31/21  3:01  PM   Specimen: Nasopharyngeal Swab; Nasopharyngeal(NP) swabs in vial transport medium  Result Value Ref Range Status   SARS Coronavirus 2 by RT PCR NEGATIVE NEGATIVE Final    Comment: (NOTE) SARS-CoV-2 target nucleic acids are NOT DETECTED.  The SARS-CoV-2 RNA is generally detectable in upper respiratory specimens during the acute phase of infection. The lowest concentration of SARS-CoV-2 viral copies this assay can detect is 138 copies/mL. A negative result does not preclude SARS-Cov-2 infection and should not be used as the sole basis for treatment or other patient management decisions. A negative result may occur with  improper specimen collection/handling, submission of specimen other than nasopharyngeal swab, presence of viral mutation(s) within the areas targeted by this assay, and inadequate number of viral copies(<138 copies/mL). A  negative result must be combined with clinical observations, patient history, and epidemiological information. The expected result is Negative.  Fact Sheet for Patients:  EntrepreneurPulse.com.au  Fact Sheet for Healthcare Providers:  IncredibleEmployment.be  This test is no t yet approved or cleared by the [place] FDA and  has been authorized for detection and/or diagnosis of SARS-CoV-2 by FDA under an Emergency Use Authorization (EUA). This EUA will remain  in effect (meaning this test can be used) for the duration of the COVID-19 declaration under Section 564(b)(1) of the Act, 21 U.S.C.section 360bbb-3(b)(1), unless the authorization is terminated  or revoked sooner.       Influenza A by PCR NEGATIVE NEGATIVE Final   Influenza B by PCR NEGATIVE NEGATIVE Final    Comment: (NOTE) The Xpert Xpress SARS-CoV-2/FLU/RSV plus assay is intended as an aid in the diagnosis of influenza from Nasopharyngeal swab specimens and should not be used as a sole basis for treatment. Nasal washings and aspirates are unacceptable for Xpert Xpress SARS-CoV-2/FLU/RSV testing.  Fact Sheet for Patients: EntrepreneurPulse.com.au  Fact Sheet for Healthcare Providers: IncredibleEmployment.be  This test is not yet approved or cleared by the [place] FDA and has been authorized for detection and/or diagnosis of SARS-CoV-2 by FDA under an Emergency Use Authorization (EUA). This EUA will remain in effect (meaning this test can be used) for the duration of the COVID-19 declaration under Section 564(b)(1) of the Act, 21 U.S.C. section 360bbb-3(b)(1), unless the authorization is terminated or revoked.  Performed at [facility], [address], [place], [place] [address]       Studies: No results found.  Scheduled Meds:  Chlorhexidine Gluconate Cloth  6 each Topical Daily   feeding supplement (NEPRO CARB  STEADY)  237 mL Oral TID WC   multivitamin  1 tablet Oral QHS   pantoprazole  20 mg Oral Daily   sodium bicarbonate  650 mg Oral TID    Continuous Infusions:  sodium bicarbonate 150 mEq in D5W infusion 100 mL/hr at 08/02/21 1135     LOS: 2 days     Kayleen Memos, MD [facility] Pager [phone]  If 7PM-7AM, please contact night-coverage [email or web address] Password [email or web address] 08/02/2021, 1:15 PM

## 2021-08-02 NOTE — Progress Notes (Signed)
Palliative:  HPI: 76 y.o. female  with past medical history of dementia, hypertension, hyperlipidemia, Barretts esophagus, CKD stage 3b, GERD, bowel cancer, sacral decubitus, rectal fissures, depression admitted on 07/31/2021 with acute renal failure after recent temporary placement for respite 07/19/21-07/22/21 followed by decreased appetite/intake and weakness. Also noted that she [facility] with [facility].    I met today with [name]. She is more alert and interactive than yesterday. Her breakfast tray is at bedside and she indicates she is hungry. I gave her a few bites which she accepted and she seemed to be tolerating well but with very slow mastication and even seemed to continue chewing when no food in mouth. No family at bedside.   I called and spoke with daughter, Kaitlin Wagner. She is working but plans to come and visit this evening. We reviewed her mother's status. Kaitlin Wagner was under the impression that her mother has planned to have dialysis today. I explained that my last conversation with Kaitlin Deeds, NP with nephrology that they were going to continue to hold on dialysis for today given her numbers were slightly better, she still has urine output, and her electrolytes are stable - although this could have changed since I last spoke with them. I did explain that we do still feel that she will likely need dialysis at some stage but we want to be sure this is absolutely necessary. Kaitlin Wagner is open to trial of dialysis in the hospital. Kaitlin Wagner also agrees with holding dialysis for now if this is what nephrology recommends.   All questions/concerns addressed. Emotional support provided.   Exam: More alert today. Still very weak. No distress. Breathing regular, unlabored.   Plan: - DNR.  - Okay for trial of dialysis (family shared with me yesterday that they know she would not be able to do outpatient dialysis. Goal would be hospital trial to see if this can jump start her kidneys.  - She  follows with [facility].  - Will recommend palliative to follow up early next week.  25 min  Kaitlin Sill, NP Palliative Medicine Team Pager [phone] (Please see [email or web address] for schedule) Team Phone [phone]    Greater than 50%  of this time was spent counseling and coordinating care related to the above assessment and plan

## 2021-08-02 NOTE — Progress Notes (Signed)
[facility]  ROUNDING NOTE   Subjective:   Patient seen resting quietly, alert and oriented Appetite remains poor Denies nausea and vomiting Denies shortness of breath  IVF   Objective:  Vital signs in last 24 hours:  Temp:  [98.6 F (37 C)-99.3 F (37.4 C)] 99.3 F (37.4 C) (02/02 0739) Pulse Rate:  [86-98] 98 (02/02 0739) Resp:  [16-20] 16 (02/02 0739) BP: (121-136)/(46-69) 133/69 (02/02 0739) SpO2:  [98 %-99 %] 99 % (02/02 0739) Weight:  [64.8 kg] 64.8 kg (02/02 0555)  Weight change: -0.972 kg Filed Weights   07/31/21 1021 07/31/21 2338 08/02/21 0555  Weight: 65.8 kg 52.7 kg 64.8 kg    Intake/Output: I/O last 3 completed shifts: In: 1410.6 [P.O.:60; I.V.:1350.6] Out: 1500 [Urine:1500]   Intake/Output this shift:  Total I/O In: 1292.4 [P.O.:80; I.V.:1212.4] Out: 200 [Urine:200]  Physical Exam: General: NAD  Head: Normocephalic, atraumatic. Moist oral mucosal membranes  Eyes: Anicteric  Lungs:  Clear to auscultation, normal effort  Heart: Regular rate and rhythm  Abdomen:  Soft, nontender  Extremities:  no peripheral edema.  Neurologic: Nonfocal, moving all four extremities  Skin: No lesions       Basic Metabolic Panel: Recent Labs  Lab 07/31/21 1035 08/01/21 0417 08/02/21 0426  NA 138 141 144  K 5.7* 5.1 3.4*  CL 106 113* 109  CO2 12* 11* 19*  GLUCOSE 122* 78 115*  BUN 236* 254* 224*  CREATININE 13.62* 13.17* 12.78*  CALCIUM 9.0 8.1* 7.7*  MG  --  3.4*  --     Liver Function Tests: Recent Labs  Lab 07/31/21 1035 08/01/21 0417  AST 34 25  ALT 47* 34  ALKPHOS 127* 104  BILITOT 0.5 0.8  PROT 8.0 6.4*  ALBUMIN 3.8 3.1*   No results for input(s): LIPASE, AMYLASE in the last 168 hours. No results for input(s): AMMONIA in the last 168 hours.  CBC: Recent Labs  Lab 07/31/21 1035 08/01/21 0417  WBC 10.3 7.6  NEUTROABS  --  6.2  HGB 13.0 10.5*  HCT 37.9 29.9*  MCV 85.0 86.4  PLT 200 159    Cardiac Enzymes: No  results for input(s): CKTOTAL, CKMB, CKMBINDEX, TROPONINI in the last 168 hours.  BNP: Invalid input(s): POCBNP  CBG: Recent Labs  Lab 07/31/21 2047  GLUCAP 111*    Microbiology: Results for orders placed or performed during the hospital encounter of 07/31/21  Resp Panel by RT-PCR (Flu A&B, Covid) Nasopharyngeal Swab     Status: None   Collection Time: 07/31/21  3:01 PM   Specimen: Nasopharyngeal Swab; Nasopharyngeal(NP) swabs in vial transport medium  Result Value Ref Range Status   SARS Coronavirus 2 by RT PCR NEGATIVE NEGATIVE Final    Comment: (NOTE) SARS-CoV-2 target nucleic acids are NOT DETECTED.  The SARS-CoV-2 RNA is generally detectable in upper respiratory specimens during the acute phase of infection. The lowest concentration of SARS-CoV-2 viral copies this assay can detect is 138 copies/mL. A negative result does not preclude SARS-Cov-2 infection and should not be used as the sole basis for treatment or other patient management decisions. A negative result may occur with  improper specimen collection/handling, submission of specimen other than nasopharyngeal swab, presence of viral mutation(s) within the areas targeted by this assay, and inadequate number of viral copies(<138 copies/mL). A negative result must be combined with clinical observations, patient history, and epidemiological information. The expected result is Negative.  Fact Sheet for Patients:  EntrepreneurPulse.com.au  Fact Sheet for Healthcare Providers:  IncredibleEmployment.be  This test is no t yet approved or cleared by the [place] and  has been authorized for detection and/or diagnosis of SARS-CoV-2 by FDA under an Emergency Use Authorization (EUA). This EUA will remain  in effect (meaning this test can be used) for the duration of the COVID-19 declaration under Section 564(b)(1) of the Act, 21 U.S.C.section 360bbb-3(b)(1), unless the  authorization is terminated  or revoked sooner.       Influenza A by PCR NEGATIVE NEGATIVE Final   Influenza B by PCR NEGATIVE NEGATIVE Final    Comment: (NOTE) The Xpert Xpress SARS-CoV-2/FLU/RSV plus assay is intended as an aid in the diagnosis of influenza from Nasopharyngeal swab specimens and should not be used as a sole basis for treatment. Nasal washings and aspirates are unacceptable for Xpert Xpress SARS-CoV-2/FLU/RSV testing.  Fact Sheet for Patients: EntrepreneurPulse.com.au  Fact Sheet for Healthcare Providers: IncredibleEmployment.be  This test is not yet approved or cleared by the [place] FDA and has been authorized for detection and/or diagnosis of SARS-CoV-2 by FDA under an Emergency Use Authorization (EUA). This EUA will remain in effect (meaning this test can be used) for the duration of the COVID-19 declaration under Section 564(b)(1) of the Act, 21 U.S.C. section 360bbb-3(b)(1), unless the authorization is terminated or revoked.  Performed at [facility], [address] [place], [place] [address]     Coagulation Studies: No results for input(s): LABPROT, INR in the last 72 hours.  Urinalysis: Recent Labs    07/31/21 1352  COLORURINE YELLOW  LABSPEC 1.010  PHURINE 5.0  GLUCOSEU NEGATIVE  HGBUR TRACE*  BILIRUBINUR NEGATIVE  KETONESUR NEGATIVE  PROTEINUR NEGATIVE  NITRITE NEGATIVE  LEUKOCYTESUR SMALL*      Imaging: CT ABDOMEN PELVIS WO CONTRAST  Result Date: 07/31/2021 CLINICAL DATA:  Acute renal failure. EXAM: CT ABDOMEN AND PELVIS WITHOUT CONTRAST TECHNIQUE: Multidetector CT imaging of the abdomen and pelvis was performed following the standard protocol without IV contrast. RADIATION DOSE REDUCTION: This exam was performed according to the departmental dose-optimization program which includes automated exposure control, adjustment of the mA and/or kV according to patient size and/or use of  iterative reconstruction technique. COMPARISON:  10/20/2020 FINDINGS: Lower chest: Motion artifact at the lung bases. No large pleural effusion. Hepatobiliary: Cholecystectomy.  Normal appearance of the liver. Pancreas: Unremarkable. No pancreatic ductal dilatation or surrounding inflammatory changes. Spleen: Normal in size without focal abnormality. Adrenals/Urinary Tract: Normal adrenal glands. 8 mm stone in left kidney lower pole without hydronephrosis. Normal appearance of the urinary bladder with small to moderate amount of fluid. 2 mm stone in the right kidney lower pole without hydronephrosis. Motion artifact throughout the abdomen. Stomach/Bowel: Again noted are at least 2 areas with bowel anastomosis. Dilated gas-filled loops of bowel throughout the anterior aspect of the abdomen. Findings are similar to the exam from 10/20/2020. No focal bowel inflammation. Vascular/Lymphatic: Diffuse atherosclerotic calcifications in the aorta and iliac arteries. Negative for abdominal aortic aneurysm. No significant lymph node enlargement in the abdomen or pelvis. Lymph node along the right external iliac nodal chain on sequence 2 image 61 measures 8 mm in the short axis and stable. Reproductive: Uterus and bilateral adnexa are unremarkable. Other: Again noted is laxity along the anterior abdominal wall with minimal subcutaneous fat along the anterior abdominal wall. Findings along the anterior abdominal wall are likely postoperative in etiology. Negative for ascites. Negative for free air. Musculoskeletal: Extensive heterogeneity in the lumbar spine is similar to the previous examination. Partial  healing of the sacral decubitus ulcer. There continues to be a small ulceration in this area. IMPRESSION: 1. Bilateral renal calculi without hydronephrosis. 2. Study has limitations from excessive motion artifact. 3. Gas-filled dilated loops of bowel throughout the abdomen. Postoperative bowel changes. Bowel gas distension is  nonspecific but could represent an ileus. Limited evaluation due to extensive motion artifact on this examination. 4. Sacral decubitus ulceration has partially healed since 10/20/2020. 5.  Aortic Atherosclerosis (ICD10-I70.0). Electronically Signed   By: Markus Daft M.D.   On: 07/31/2021 13:15   US RENAL  Result Date: 08/01/2021 CLINICAL DATA:  Acute renal failure EXAM: RENAL / URINARY TRACT ULTRASOUND COMPLETE COMPARISON:  CT abdomen 07/31/2021 FINDINGS: Right Kidney: Renal measurements: 10.4 x 4.7 x 4.2 cm = volume: 108 solid mL. Echogenicity within normal limits. No solid mass or hydronephrosis visualized. 1.2 x 1.5 x 1.8 cm right renal cyst along the upper pole. Left Kidney: Renal measurements: 11.4 x 4.6 x 4.8 cm = volume: 132 mL. Echogenicity within normal limits. No mass or hydronephrosis visualized. Bladder: Bladder is unremarkable with a Foley catheter present. Other: None. IMPRESSION: 1. No obstructive uropathy. Electronically Signed   By: Kathreen Devoid M.D.   On: 08/01/2021 10:33     Medications:    sodium bicarbonate 150 mEq in D5W infusion 100 mL/hr at 08/02/21 8413    Chlorhexidine Gluconate Cloth  6 each Topical Daily   feeding supplement (NEPRO CARB STEADY)  237 mL Oral TID WC   multivitamin  1 tablet Oral QHS   pantoprazole  20 mg Oral Daily   sodium bicarbonate  650 mg Oral TID   acetaminophen **OR** acetaminophen  Assessment/ Plan:  [name] is a 76 y.o.  female  with a past medical history of GERD, hypertension, diabetes, and hyperlipidemia, who was admitted to [facility] on 07/31/2021 for Acute renal failure (ARF) ([place]) [N17.9] AKI (acute kidney injury) ([place]) [N17.9]   Acute Kidney Injury on chronic kidney disease stage IV with baseline creatinine 1.5 and GFR of 33 on 014/26/22.  Acute kidney injury secondary to dehydration, patient appears hypovolemic Chronic kidney disease risk factors include diabetes and hypertension. Renal ultrasound negative for obstruction No  IV contrast exposure.  No indication for dialysis at this time.   -Creatinine has improved with IV fluids, 12.78 -Adequate urine output within 24 hours of 900 mL recorded -Continue IV fluids at this time -Continue to avoid nephrotoxic agents and therapies  Lab Results  Component Value Date   CREATININE 12.78 (H) 08/02/2021   CREATININE 13.17 (H) 08/01/2021   CREATININE 13.62 (H) 07/31/2021    Intake/Output Summary (Last 24 hours) at 08/02/2021 1038 Last data filed at 08/02/2021 0803 Gross per 24 hour  Intake 2021.32 ml  Output 850 ml  Net 1171.32 ml   2. Metabolic acidosis: improving with sodium bicarb infusion   3.  Anemia of chronic kidney disease We will continue to monitor hemoglobin  4. 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  Primary team to manage sliding scale insulin    LOS: 2   2/2/202310:38 AM

## 2021-08-03 DIAGNOSIS — N179 Acute kidney failure, unspecified: Secondary | ICD-10-CM | POA: Diagnosis not present

## 2021-08-03 LAB — CBC
HCT: 29.5 % — ABNORMAL LOW (ref 36.0–46.0)
Hemoglobin: 10.3 g/dL — ABNORMAL LOW (ref 12.0–15.0)
MCH: 29.3 pg (ref 26.0–34.0)
MCHC: 34.9 g/dL (ref 30.0–36.0)
MCV: 83.8 fL (ref 80.0–100.0)
Platelets: 124 10*3/uL — ABNORMAL LOW (ref 150–400)
RBC: 3.52 MIL/uL — ABNORMAL LOW (ref 3.87–5.11)
RDW: 13.9 % (ref 11.5–15.5)
WBC: 12 10*3/uL — ABNORMAL HIGH (ref 4.0–10.5)
nRBC: 0 % (ref 0.0–0.2)

## 2021-08-03 LAB — COMPREHENSIVE METABOLIC PANEL
ALT: 23 U/L (ref 0–44)
AST: 21 U/L (ref 15–41)
Albumin: 2.8 g/dL — ABNORMAL LOW (ref 3.5–5.0)
Alkaline Phosphatase: 93 U/L (ref 38–126)
Anion gap: 16 — ABNORMAL HIGH (ref 5–15)
BUN: 189 mg/dL — ABNORMAL HIGH (ref 8–23)
CO2: 29 mmol/L (ref 22–32)
Calcium: 7.6 mg/dL — ABNORMAL LOW (ref 8.9–10.3)
Chloride: 98 mmol/L (ref 98–111)
Creatinine, Ser: 11.67 mg/dL — ABNORMAL HIGH (ref 0.44–1.00)
GFR, Estimated: 3 mL/min — ABNORMAL LOW (ref >60)
Glucose, Bld: 176 mg/dL — ABNORMAL HIGH (ref 70–99)
Potassium: 3.1 mmol/L — ABNORMAL LOW (ref 3.5–5.1)
Sodium: 143 mmol/L (ref 135–145)
Total Bilirubin: 0.8 mg/dL (ref 0.3–1.2)
Total Protein: 6 g/dL — ABNORMAL LOW (ref 6.5–8.1)

## 2021-08-03 LAB — PHOSPHORUS: Phosphorus: 7.6 mg/dL — ABNORMAL HIGH (ref 2.5–4.6)

## 2021-08-03 LAB — MAGNESIUM: Magnesium: 2.7 mg/dL — ABNORMAL HIGH (ref 1.7–2.4)

## 2021-08-03 LAB — PROTIME-INR
INR: 1.2 (ref 0.8–1.2)
Prothrombin Time: 15.2 seconds (ref 11.4–15.2)

## 2021-08-03 MED ORDER — POTASSIUM CHLORIDE 20 MEQ PO PACK
20.0000 meq | PACK | Freq: Once | ORAL | Status: AC
  Administered 2021-08-03: 20 meq via ORAL
  Filled 2021-08-03: qty 1

## 2021-08-03 MED ORDER — LACTATED RINGERS IV SOLN: INTRAVENOUS | Status: DC

## 2021-08-03 NOTE — Progress Notes (Signed)
PROGRESS NOTE  Kaitlin Wagner AQT:[id number] DOB: 03/17/1946 DOA: 07/31/2021 PCP: Marnee Guarneri, MD  HPI/Recap of past 24 hours: Kaitlin Wagner is a 77 y.o. F with advanced dementia, HTN, and CKD IIIb who was sent to ER by PCP for acute renal failure.  Patient recently spent few days in SNF.  On return home, family noted she was less responsive, weaker (requiring wheelchair) and had minimal appetite.  At PCP's office, found to have Cr 13 from baseline 1.6.  She was sent to the ER for further evaluation and management of her acute kidney injury.  Nephrology consulted and following.  She had had minimal improvement of her renal function since admission.  08/03/2021: Seen and examined at bedside.  No acute events overnight.  She has no new complaints.  Mild decrease in her creatinine this morning.  On IV fluid hydration.    Assessment/Plan: Principal Problem:   Acute renal failure (ARF) (HCC) Active Problems:   Advanced dementia   GERD (gastroesophageal reflux disease)   Primary hypertension   Depression   Pressure injury of right buttock, stage 2 (HCC)   Stage 3b chronic kidney disease (CKD) (HCC)   Protein-calorie malnutrition, severe   Distended abdomen   Acute metabolic encephalopathy   Uremia  Severe acute renal failure (ARF) ([place])- (present on admission) on CKD 3B Baseline creatinine appears to be 1.5 with GFR 33 from 9 months ago. Presented with creatinine of 13 with GFR of 3. Uremia with BUN greater than 180 Creatinine is downtrending 11.67, on bicarb drip, switched to LR on 08/03/2021. Followed by nephrology. Continue to avoid nephrotoxic agents, dehydration and hypotension. Continue to closely monitor urine output with strict I's and O's. Continue to trend renal function.  Hypokalemia Serum potassium 3.1 Repleted judiciously in the setting of severe AKI Repeat renal function in the morning.  Hyperphosphatemia in the setting of severe AKI Serum phosphorus 7.6 Defer  management to nephrology  Resolved anion gap metabolic acidosis She was started on bicarb drip, discontinued on 08/03/2018. Serum bicarb 29 from 19 with anion gap of 16 Repeat renal function in the morning.  Anemia of chronic disease in the setting of advanced renal failure Hemoglobin downtrending 10.2 from 10.5 from 13. Had an episode of epistaxis the evening of 08/01/2021 Continue to closely monitor H&H, monitor for overt bleeding. INR 1.2 on 08/03/2021.  Distended abdomen CT showed distended bowel loops. No vomiting or pain to suggest symptomatic ileus.  Resolved hyperkalemia, now hypokalemia Potassium 3.1 from 3.4 from 5.7. Hypokalemia likely contributed by bicarb drip which was stopped on 08/03/2021 Repleted judiciously.  Advanced dementia- (present on admission) At baseline, family report she is oriented to self and family, aware of home surroundings, can converse with them appropriately.  Acute metabolic encephalopathy secondary to uremia Somnolent intermittently   Uremia Epistaxis and gums bleeding noted evening of 08/01/2021. -Hold pharmacological DVT prophylaxis.   Protein-calorie malnutrition, severe Severe muscle mass loss BMI 22, albumin 2.8  Pressure injury of right buttock, stage 2 ([place])- (present on admission) - WOC Continue local wound care   DNR (do not resuscitate)/DNI(Do Not Intubate) Verified DNR/DNI status with dtr Kaitlin Wagner.   Depression- (present on admission) -Hold Celexa   Primary hypertension BP normal off meds   GERD (gastroesophageal reflux disease)- (present on admission) -Continue PPI        Code Status: DNR  Family Communication: None at bedside  Disposition Plan: Plan to discharge to SNF once nephrology signs off.   Consultants: Nephrology  Procedures: None.  Antimicrobials: None.  DVT prophylaxis: SCDs  Status is: Inpatient Patient requires at least 2 midnights for further evaluation and treatment of present  condition.            Objective: Vitals:   08/03/21 0438 08/03/21 0445 08/03/21 0906 08/03/21 1532  BP:  (!) 147/76 (!) 113/54 (!) 133/53  Pulse:  76 74 73  Resp:  16 16 18   Temp:  (!) 97.5 F (36.4 C) (!) 97.3 F (36.3 C) (!) 97.5 F (36.4 C)  TempSrc:  Axillary Oral Oral  SpO2:  99% 98% 100%  Weight: 62.1 kg     Height:        Intake/Output Summary (Last 24 hours) at 08/03/2021 1618 Last data filed at 08/03/2021 1410 Gross per 24 hour  Intake 1689 ml  Output 2350 ml  Net -661 ml   Filed Weights   07/31/21 2338 08/02/21 0555 08/03/21 0438  Weight: 52.7 kg 64.8 kg 62.1 kg    Exam:  General: 76 y.o. year-old female frail-appearing no acute distress.  She is alert and oriented oriented x1 to self only.   Cardiovascular: Regular rate and rhythm no rubs or gallops.   Respiratory: Clear to auscultation no wheezes or rales.   Abdomen: Soft nontender normal bowel sounds present. Musculoskeletal: Trace lower extremity edema bilaterally. Skin: No ulcerative lesions noted. Psychiatry: Mood is appropriate for condition and setting. Neuro: Moves all extremities.   Data Reviewed: CBC: Recent Labs  Lab 07/31/21 1035 08/01/21 0417 08/03/21 0610  WBC 10.3 7.6 12.0*  NEUTROABS  --  6.2  --   HGB 13.0 10.5* 10.3*  HCT 37.9 29.9* 29.5*  MCV 85.0 86.4 83.8  PLT 200 159 025*   Basic Metabolic Panel: Recent Labs  Lab 07/31/21 1035 08/01/21 0417 08/02/21 0426 08/03/21 0610  NA 138 141 144 143  K 5.7* 5.1 3.4* 3.1*  CL 106 113* 109 98  CO2 12* 11* 19* 29  GLUCOSE 122* 78 115* 176*  BUN 236* 254* 224* 189*  CREATININE 13.62* 13.17* 12.78* 11.67*  CALCIUM 9.0 8.1* 7.7* 7.6*  MG  --  3.4*  --  2.7*  PHOS  --   --   --  7.6*   GFR: Estimated Creatinine Clearance: 3.8 mL/min (A) (by C-G formula based on SCr of 11.67 mg/dL (H)). Liver Function Tests: Recent Labs  Lab 07/31/21 1035 08/01/21 0417 08/03/21 0610  AST 34 25 21  ALT 47* 34 23  ALKPHOS 127* 104 93   BILITOT 0.5 0.8 0.8  PROT 8.0 6.4* 6.0*  ALBUMIN 3.8 3.1* 2.8*   No results for input(s): LIPASE, AMYLASE in the last 168 hours. No results for input(s): AMMONIA in the last 168 hours. Coagulation Profile: Recent Labs  Lab 08/03/21 0610  INR 1.2   Cardiac Enzymes: No results for input(s): CKTOTAL, CKMB, CKMBINDEX, TROPONINI in the last 168 hours. BNP (last 3 results) No results for input(s): PROBNP in the last 8760 hours. HbA1C: No results for input(s): HGBA1C in the last 72 hours. CBG: Recent Labs  Lab 07/31/21 2047  GLUCAP 111*   Lipid Profile: No results for input(s): CHOL, HDL, LDLCALC, TRIG, CHOLHDL, LDLDIRECT in the last 72 hours. Thyroid Function Tests: No results for input(s): TSH, T4TOTAL, FREET4, T3FREE, THYROIDAB in the last 72 hours. Anemia Panel: No results for input(s): VITAMINB12, FOLATE, FERRITIN, TIBC, IRON, RETICCTPCT in the last 72 hours. Urine analysis:    Component Value Date/Time   COLORURINE YELLOW 07/31/2021 1352  APPEARANCEUR CLEAR 07/31/2021 1352   LABSPEC 1.010 07/31/2021 1352   PHURINE 5.0 07/31/2021 1352   GLUCOSEU NEGATIVE 07/31/2021 1352   HGBUR TRACE (A) 07/31/2021 1352   BILIRUBINUR NEGATIVE 07/31/2021 1352   KETONESUR NEGATIVE 07/31/2021 1352   PROTEINUR NEGATIVE 07/31/2021 1352   NITRITE NEGATIVE 07/31/2021 1352   LEUKOCYTESUR SMALL (A) 07/31/2021 1352   Sepsis Labs: @LABRCNTIP (procalcitonin:4,lacticidven:4)  ) Recent Results (from the past 240 hour(s))  Resp Panel by RT-PCR (Flu A&B, Covid) Nasopharyngeal Swab     Status: None   Collection Time: 07/31/21  3:01 PM   Specimen: Nasopharyngeal Swab; Nasopharyngeal(NP) swabs in vial transport medium  Result Value Ref Range Status   SARS Coronavirus 2 by RT PCR NEGATIVE NEGATIVE Final    Comment: (NOTE) SARS-CoV-2 target nucleic acids are NOT DETECTED.  The SARS-CoV-2 RNA is generally detectable in upper respiratory specimens during the acute phase of infection. The  lowest concentration of SARS-CoV-2 viral copies this assay can detect is 138 copies/mL. A negative result does not preclude SARS-Cov-2 infection and should not be used as the sole basis for treatment or other patient management decisions. A negative result may occur with  improper specimen collection/handling, submission of specimen other than nasopharyngeal swab, presence of viral mutation(s) within the areas targeted by this assay, and inadequate number of viral copies(<138 copies/mL). A negative result must be combined with clinical observations, patient history, and epidemiological information. The expected result is Negative.  Fact Sheet for Patients:  EntrepreneurPulse.com.au  Fact Sheet for Healthcare Providers:  IncredibleEmployment.be  This test is no t yet approved or cleared by the [place] FDA and  has been authorized for detection and/or diagnosis of SARS-CoV-2 by FDA under an Emergency Use Authorization (EUA). This EUA will remain  in effect (meaning this test can be used) for the duration of the COVID-19 declaration under Section 564(b)(1) of the Act, 21 U.S.C.section 360bbb-3(b)(1), unless the authorization is terminated  or revoked sooner.       Influenza A by PCR NEGATIVE NEGATIVE Final   Influenza B by PCR NEGATIVE NEGATIVE Final    Comment: (NOTE) The Xpert Xpress SARS-CoV-2/FLU/RSV plus assay is intended as an aid in the diagnosis of influenza from Nasopharyngeal swab specimens and should not be used as a sole basis for treatment. Nasal washings and aspirates are unacceptable for Xpert Xpress SARS-CoV-2/FLU/RSV testing.  Fact Sheet for Patients: EntrepreneurPulse.com.au  Fact Sheet for Healthcare Providers: IncredibleEmployment.be  This test is not yet approved or cleared by the [place] FDA and has been authorized for detection and/or diagnosis of SARS-CoV-2 by FDA under  an Emergency Use Authorization (EUA). This EUA will remain in effect (meaning this test can be used) for the duration of the COVID-19 declaration under Section 564(b)(1) of the Act, 21 U.S.C. section 360bbb-3(b)(1), unless the authorization is terminated or revoked.  Performed at [facility], [address] [place], [place] [address]       Studies: No results found.  Scheduled Meds:  Chlorhexidine Gluconate Cloth  6 each Topical Daily   feeding supplement (NEPRO CARB STEADY)  237 mL Oral TID WC   multivitamin  1 tablet Oral QHS   pantoprazole  20 mg Oral Daily    Continuous Infusions:  lactated ringers 100 mL/hr at 08/03/21 1345     LOS: 3 days     Kayleen Memos, MD [facility] Pager [phone]  If 7PM-7AM, please contact night-coverage [email or web address] Password [email or web address] 08/03/2021, 4:18 PM

## 2021-08-03 NOTE — Progress Notes (Addendum)
[facility]  ROUNDING NOTE   Subjective:   Patient currently sitting up in bed, alert Breakfast tray at bedside, requiring assistance with feeding No family at bedside  Denies shortness of breath, remains on room air   Objective:  Vital signs in last 24 hours:  Temp:  [97.3 F (36.3 C)-98.2 F (36.8 C)] 97.3 F (36.3 C) (02/03 0906) Pulse Rate:  [74-78] 74 (02/03 0906) Resp:  [16-20] 16 (02/03 0906) BP: (113-147)/(49-76) 113/54 (02/03 0906) SpO2:  [98 %-100 %] 98 % (02/03 0906) Weight:  [62.1 kg] 62.1 kg (02/03 0438)  Weight change: -2.7 kg Filed Weights   07/31/21 2338 08/02/21 0555 08/03/21 0438  Weight: 52.7 kg 64.8 kg 62.1 kg    Intake/Output: I/O last 3 completed shifts: In: 1965.7 [P.O.:80; I.V.:1885.7] Out: 2750 [Urine:2750]   Intake/Output this shift:  Total I/O In: 1449 [I.V.:1449] Out: 450 [Urine:450]  Physical Exam: General: NAD  Head: Normocephalic, atraumatic. Moist oral mucosal membranes  Eyes: Anicteric  Lungs:  Clear to auscultation, normal effort  Heart: Regular rate and rhythm  Abdomen:  Soft, nontender  Extremities:  no peripheral edema.  Neurologic: Nonfocal, moving all four extremities  Skin: No lesions       Basic Metabolic Panel: Recent Labs  Lab 07/31/21 1035 08/01/21 0417 08/02/21 0426 08/03/21 0610  NA 138 141 144 143  K 5.7* 5.1 3.4* 3.1*  CL 106 113* 109 98  CO2 12* 11* 19* 29  GLUCOSE 122* 78 115* 176*  BUN 236* 254* 224* 189*  CREATININE 13.62* 13.17* 12.78* 11.67*  CALCIUM 9.0 8.1* 7.7* 7.6*  MG  --  3.4*  --  2.7*  PHOS  --   --   --  7.6*     Liver Function Tests: Recent Labs  Lab 07/31/21 1035 08/01/21 0417 08/03/21 0610  AST 34 25 21  ALT 47* 34 23  ALKPHOS 127* 104 93  BILITOT 0.5 0.8 0.8  PROT 8.0 6.4* 6.0*  ALBUMIN 3.8 3.1* 2.8*    No results for input(s): LIPASE, AMYLASE in the last 168 hours. No results for input(s): AMMONIA in the last 168 hours.  CBC: Recent Labs   Lab 07/31/21 1035 08/01/21 0417 08/03/21 0610  WBC 10.3 7.6 12.0*  NEUTROABS  --  6.2  --   HGB 13.0 10.5* 10.3*  HCT 37.9 29.9* 29.5*  MCV 85.0 86.4 83.8  PLT 200 159 124*     Cardiac Enzymes: No results for input(s): CKTOTAL, CKMB, CKMBINDEX, TROPONINI in the last 168 hours.  BNP: Invalid input(s): POCBNP  CBG: Recent Labs  Lab 07/31/21 2047  GLUCAP 111*     Microbiology: Results for orders placed or performed during the hospital encounter of 07/31/21  Resp Panel by RT-PCR (Flu A&B, Covid) Nasopharyngeal Swab     Status: None   Collection Time: 07/31/21  3:01 PM   Specimen: Nasopharyngeal Swab; Nasopharyngeal(NP) swabs in vial transport medium  Result Value Ref Range Status   SARS Coronavirus 2 by RT PCR NEGATIVE NEGATIVE Final    Comment: (NOTE) SARS-CoV-2 target nucleic acids are NOT DETECTED.  The SARS-CoV-2 RNA is generally detectable in upper respiratory specimens during the acute phase of infection. The lowest concentration of SARS-CoV-2 viral copies this assay can detect is 138 copies/mL. A negative result does not preclude SARS-Cov-2 infection and should not be used as the sole basis for treatment or other patient management decisions. A negative result may occur with  improper specimen collection/handling, submission of specimen other  than nasopharyngeal swab, presence of viral mutation(s) within the areas targeted by this assay, and inadequate number of viral copies(<138 copies/mL). A negative result must be combined with clinical observations, patient history, and epidemiological information. The expected result is Negative.  Fact Sheet for Patients:  EntrepreneurPulse.com.au  Fact Sheet for Healthcare Providers:  IncredibleEmployment.be  This test is no t yet approved or cleared by the [place] FDA and  has been authorized for detection and/or diagnosis of SARS-CoV-2 by FDA under an Emergency Use  Authorization (EUA). This EUA will remain  in effect (meaning this test can be used) for the duration of the COVID-19 declaration under Section 564(b)(1) of the Act, 21 U.S.C.section 360bbb-3(b)(1), unless the authorization is terminated  or revoked sooner.       Influenza A by PCR NEGATIVE NEGATIVE Final   Influenza B by PCR NEGATIVE NEGATIVE Final    Comment: (NOTE) The Xpert Xpress SARS-CoV-2/FLU/RSV plus assay is intended as an aid in the diagnosis of influenza from Nasopharyngeal swab specimens and should not be used as a sole basis for treatment. Nasal washings and aspirates are unacceptable for Xpert Xpress SARS-CoV-2/FLU/RSV testing.  Fact Sheet for Patients: EntrepreneurPulse.com.au  Fact Sheet for Healthcare Providers: IncredibleEmployment.be  This test is not yet approved or cleared by the [place] FDA and has been authorized for detection and/or diagnosis of SARS-CoV-2 by FDA under an Emergency Use Authorization (EUA). This EUA will remain in effect (meaning this test can be used) for the duration of the COVID-19 declaration under Section 564(b)(1) of the Act, 21 U.S.C. section 360bbb-3(b)(1), unless the authorization is terminated or revoked.  Performed at [facility], [address] [place], [place] [address]     Coagulation Studies: Recent Labs    08/03/21 0610  LABPROT 15.2  INR 1.2    Urinalysis: Recent Labs    07/31/21 1352  COLORURINE YELLOW  LABSPEC 1.010  PHURINE 5.0  GLUCOSEU NEGATIVE  HGBUR TRACE*  BILIRUBINUR NEGATIVE  KETONESUR NEGATIVE  PROTEINUR NEGATIVE  NITRITE NEGATIVE  LEUKOCYTESUR SMALL*       Imaging: No results found.   Medications:    sodium bicarbonate 150 mEq in D5W infusion 100 mL/hr at 08/03/21 0932    Chlorhexidine Gluconate Cloth  6 each Topical Daily   feeding supplement (NEPRO CARB STEADY)  237 mL Oral TID WC   multivitamin  1 tablet Oral QHS    pantoprazole  20 mg Oral Daily   acetaminophen **OR** acetaminophen  Assessment/ Plan:  [name] is a 76 y.o.  female  with a past medical history of GERD, hypertension, diabetes, and hyperlipidemia, who was admitted to [facility] on 07/31/2021 for Acute renal failure (ARF) ([place]) [N17.9] AKI (acute kidney injury) ([place]) [N17.9]   Acute Kidney Injury on chronic kidney disease stage IIIb with baseline creatinine 1.5 and GFR of 33 on 014/26/22.  Acute kidney injury secondary to dehydration, patient appears hypovolemic Chronic kidney disease risk factors include diabetes and hypertension. Renal ultrasound negative for obstruction No IV contrast exposure.  No indication for dialysis at this time.   -Creatinine continues to slowly improve resulting in improved alertness and mentation - Urine output reported at 2 L within 24 hours -continue IVF, lactated ringers @100ml /hr -We will continue to monitor closely with low threshold for initiation of renal replacement therapy.  Lab Results  Component Value Date   CREATININE 11.67 (H) 08/03/2021   CREATININE 12.78 (H) 08/02/2021   CREATININE 13.17 (H) 08/01/2021    Intake/Output  Summary (Last 24 hours) at 08/03/2021 1208 Last data filed at 08/03/2021 0949 Gross per 24 hour  Intake 2122.34 ml  Output 2350 ml  Net -227.66 ml    2. Metabolic acidosis. Improved to 29 today. Will stop sodium bicarb and order lactated ringers as above.    3.  Anemia of chronic kidney disease Hemoglobin decreased but stable at 10.3.  4. 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      LOS: [place] 2/3/202312:08 PM

## 2021-08-04 DIAGNOSIS — F03C0 Unspecified dementia, severe, without behavioral disturbance, psychotic disturbance, mood disturbance, and anxiety: Secondary | ICD-10-CM | POA: Diagnosis not present

## 2021-08-04 DIAGNOSIS — N179 Acute kidney failure, unspecified: Secondary | ICD-10-CM | POA: Diagnosis not present

## 2021-08-04 DIAGNOSIS — N1832 Chronic kidney disease, stage 3b: Secondary | ICD-10-CM | POA: Diagnosis not present

## 2021-08-04 DIAGNOSIS — G9341 Metabolic encephalopathy: Secondary | ICD-10-CM | POA: Diagnosis not present

## 2021-08-04 DIAGNOSIS — N19 Unspecified kidney failure: Secondary | ICD-10-CM

## 2021-08-04 LAB — RENAL FUNCTION PANEL
Albumin: 2.7 g/dL — ABNORMAL LOW (ref 3.5–5.0)
Anion gap: 12 (ref 5–15)
BUN: 173 mg/dL — ABNORMAL HIGH (ref 8–23)
CO2: 32 mmol/L (ref 22–32)
Calcium: 7.9 mg/dL — ABNORMAL LOW (ref 8.9–10.3)
Chloride: 100 mmol/L (ref 98–111)
Creatinine, Ser: 10.78 mg/dL — ABNORMAL HIGH (ref 0.44–1.00)
GFR, Estimated: 3 mL/min — ABNORMAL LOW (ref >60)
Glucose, Bld: 113 mg/dL — ABNORMAL HIGH (ref 70–99)
Phosphorus: 5.8 mg/dL — ABNORMAL HIGH (ref 2.5–4.6)
Potassium: 3.3 mmol/L — ABNORMAL LOW (ref 3.5–5.1)
Sodium: 144 mmol/L (ref 135–145)

## 2021-08-04 LAB — CBC
HCT: 29.5 % — ABNORMAL LOW (ref 36.0–46.0)
Hemoglobin: 10.1 g/dL — ABNORMAL LOW (ref 12.0–15.0)
MCH: 30.1 pg (ref 26.0–34.0)
MCHC: 34.2 g/dL (ref 30.0–36.0)
MCV: 87.8 fL (ref 80.0–100.0)
Platelets: 116 10*3/uL — ABNORMAL LOW (ref 150–400)
RBC: 3.36 MIL/uL — ABNORMAL LOW (ref 3.87–5.11)
RDW: 13.8 % (ref 11.5–15.5)
WBC: 16 10*3/uL — ABNORMAL HIGH (ref 4.0–10.5)
nRBC: 0 % (ref 0.0–0.2)

## 2021-08-04 MED ORDER — POTASSIUM CHLORIDE 20 MEQ PO PACK: 40.0000 meq | PACK | Freq: Once | ORAL | Status: DC

## 2021-08-04 MED ORDER — POTASSIUM CHLORIDE 20 MEQ PO PACK
20.0000 meq | PACK | Freq: Once | ORAL | Status: AC
  Administered 2021-08-04: 20 meq via ORAL
  Filled 2021-08-04: qty 1

## 2021-08-04 NOTE — Progress Notes (Signed)
[facility]  PROGRESS NOTE   Subjective:   Patient seen at bedside.  Being fed breakfast at this time. Confused but awake and alert. Urine output is over 100 cc an hour now.  Objective:  Vital signs in last 24 hours:  Temp:  [97.5 F (36.4 C)-98.4 F (36.9 C)] 98 F (36.7 C) (02/04 1052) Pulse Rate:  [67-73] 69 (02/04 1052) Resp:  [16-18] 16 (02/04 0455) BP: (123-133)/(51-59) 131/53 (02/04 1052) SpO2:  [97 %-100 %] 99 % (02/04 1052)  Weight change:  Filed Weights   07/31/21 2338 08/02/21 0555 08/03/21 0438  Weight: 52.7 kg 64.8 kg 62.1 kg    Intake/Output: I/O last 3 completed shifts: In: 2383.8 [P.O.:240; I.V.:2143.8] Out: 3400 [Urine:3400]   Intake/Output this shift:  No intake/output data recorded.  Physical Exam: General:  No acute distress  Head:  Normocephalic, atraumatic. Moist oral mucosal membranes  Eyes:  Anicteric  Neck:  Supple  Lungs:   Clear to auscultation, normal effort  Heart:  S1S2 no rubs  Abdomen:   Soft, nontender, bowel sounds present  Extremities:  peripheral edema.  Neurologic:  Awake, alert, following commands  Skin:  No lesions  Access:     Basic Metabolic Panel: Recent Labs  Lab 07/31/21 1035 08/01/21 0417 08/02/21 0426 08/03/21 0610 08/04/21 0729  NA 138 141 144 143 144  K 5.7* 5.1 3.4* 3.1* 3.3*  CL 106 113* 109 98 100  CO2 12* 11* 19* 29 32  GLUCOSE 122* 78 115* 176* 113*  BUN 236* 254* 224* 189* 173*  CREATININE 13.62* 13.17* 12.78* 11.67* 10.78*  CALCIUM 9.0 8.1* 7.7* 7.6* 7.9*  MG  --  3.4*  --  2.7*  --   PHOS  --   --   --  7.6* 5.8*    CBC: Recent Labs  Lab 07/31/21 1035 08/01/21 0417 08/03/21 0610 08/04/21 0729  WBC 10.3 7.6 12.0* 16.0*  NEUTROABS  --  6.2  --   --   HGB 13.0 10.5* 10.3* 10.1*  HCT 37.9 29.9* 29.5* 29.5*  MCV 85.0 86.4 83.8 87.8  PLT 200 159 124* 116*     Urinalysis: No results for input(s): COLORURINE, LABSPEC, PHURINE, GLUCOSEU, HGBUR, BILIRUBINUR, KETONESUR,  PROTEINUR, UROBILINOGEN, NITRITE, LEUKOCYTESUR in the last 72 hours.  Invalid input(s): APPERANCEUR    Imaging: No results found.   Medications:    lactated ringers 100 mL/hr at 08/04/21 1040    Chlorhexidine Gluconate Cloth  6 each Topical Daily   feeding supplement (NEPRO CARB STEADY)  237 mL Oral TID WC   multivitamin  1 tablet Oral QHS   pantoprazole  20 mg Oral Daily    Assessment/ Plan:     Principal Problem:   Acute renal failure (ARF) (HCC) Active Problems:   Advanced dementia   GERD (gastroesophageal reflux disease)   Primary hypertension   Depression   Pressure injury of right buttock, stage 2 (HCC)   Stage 3b chronic kidney disease (CKD) (HCC)   Protein-calorie malnutrition, severe   Distended abdomen   Acute metabolic encephalopathy   Uremia  76 year old female with advanced dementia, hypertension, GERD and depression now admitted with history of mental status changes and found to have acute kidney injury.  #1: Acute kidney injury: Patient has acute kidney injury secondary to possible severe prerenal azotemia versus ATN.  Renal indicis are improving with IV fluids.  Urine output is much better at this time.  Patient now has possible diuretic phase of ATN.  Will need  to monitor potassium and supplement as required.  #2: Hypokalemia: We will supplement potassium today.  And continue the LR 800 cc an hour.  #3: Metabolic acidosis: Now resolved.  #4: Anemia: Anemia possibly secondary to chronic kidney disease.  Will need p.o. iron supplements.  Will continue to follow closely.     LOS: [place], [facility] 2/4/20231:13 PM

## 2021-08-04 NOTE — Progress Notes (Signed)
°  Progress Note   Patient: Kaitlin Wagner WIO:[id number] DOB: 1945-07-26 DOA: 07/31/2021     4 DOS: the patient was seen and examined on 08/04/2021   Brief hospital course: 76 year old woman with advanced dementia presented with history of being less responsive, weaker and minimal appetite.  Found to have creatinine of 13 from baseline of 1.6.  Admitted for acute renal failure.  Assessment and Plan: * Acute renal failure ( ([place]) superimposed on CKD stage IIIb - Thought secondary to severe prerenal azotemia versus ATN.  Renal function improving, urine output improving.  Continue to monitor electrolytes.  Continue management per nephrology.  Uremia --Epistaxis and gums bleeding noted earlier in hospitalization. --Treatment as above.  Acute metabolic encephalopathy --Resolved.  Secondary to uremia.  Pressure injury of right buttock, stage 2 ([place])- (present on admission) - WOC  Advanced dementia --At baseline, family report she is oriented to self and family, aware of home surroundings, can converse with them appropriately.    Subjective:  Feels ok. History may not be reliable.  Physical Exam: Vitals:   08/03/21 1532 08/03/21 2135 08/04/21 0455 08/04/21 1052  BP: (!) 133/53 (!) 129/59 (!) 123/51 (!) 131/53  Pulse: 73 67 73 69  Resp: 18 17 16    Temp: (!) 97.5 F (36.4 C) 97.9 F (36.6 C) 98.4 F (36.9 C) 98 F (36.7 C)  TempSrc: Oral  Oral   SpO2: 100% 99% 97% 99%  Weight:      Height:       Physical Exam Constitutional:      General: She is not in acute distress.    Appearance: She is not ill-appearing or toxic-appearing.  Cardiovascular:     Rate and Rhythm: Normal rate and regular rhythm.     Heart sounds: No murmur heard. Pulmonary:     Effort: Pulmonary effort is normal. No respiratory distress.     Breath sounds: No wheezing, rhonchi or rales.  Musculoskeletal:     Right lower leg: No edema.     Left lower leg: No edema.  Neurological:     Mental Status: She  is alert.  Psychiatric:        Mood and Affect: Mood normal.     Data Reviewed: Creatinine down to 10.78. K+ 3.3 WBC up to 16 Hgb stable 10.1  Family Communication:   Disposition: Status is: Inpatient  Remains inpatient appropriate because: AKI    Planned Discharge Destination: TBD     Time spent: 25 minutes  Author: Murray Hodgkins, MD 08/04/2021 2:38 PM  For on call review [email or web address].

## 2021-08-05 ENCOUNTER — Encounter: Payer: Self-pay | Admitting: Internal Medicine

## 2021-08-05 DIAGNOSIS — F03C0 Unspecified dementia, severe, without behavioral disturbance, psychotic disturbance, mood disturbance, and anxiety: Secondary | ICD-10-CM | POA: Diagnosis not present

## 2021-08-05 DIAGNOSIS — D696 Thrombocytopenia, unspecified: Secondary | ICD-10-CM | POA: Diagnosis not present

## 2021-08-05 DIAGNOSIS — I700 Atherosclerosis of aorta: Secondary | ICD-10-CM

## 2021-08-05 DIAGNOSIS — N179 Acute kidney failure, unspecified: Secondary | ICD-10-CM | POA: Diagnosis not present

## 2021-08-05 HISTORY — DX: Atherosclerosis of aorta: I70.0

## 2021-08-05 LAB — BASIC METABOLIC PANEL
Anion gap: 13 (ref 5–15)
BUN: 132 mg/dL — ABNORMAL HIGH (ref 8–23)
CO2: 32 mmol/L (ref 22–32)
Calcium: 8.3 mg/dL — ABNORMAL LOW (ref 8.9–10.3)
Chloride: 103 mmol/L (ref 98–111)
Creatinine, Ser: 9.66 mg/dL — ABNORMAL HIGH (ref 0.44–1.00)
GFR, Estimated: 4 mL/min — ABNORMAL LOW (ref >60)
Glucose, Bld: 94 mg/dL (ref 70–99)
Potassium: 3.5 mmol/L (ref 3.5–5.1)
Sodium: 148 mmol/L — ABNORMAL HIGH (ref 135–145)

## 2021-08-05 NOTE — Progress Notes (Signed)
°  Progress Note   Patient: Kaitlin Wagner DGU:[id number] DOB: [birth year] DOA: 07/31/2021     5 DOS: the patient was seen and examined on 08/05/2021   Brief hospital course: 76 year old woman with advanced dementia presented with history of being less responsive, weaker and minimal appetite. Found to have creatinine of 13 from baseline of 1.6.  Admitted for acute renal failure. --Seen by nephrology, slowly improving with conservative management.  Assessment and Plan: * Acute renal failure (ARF) ([place])- (present on admission) - Slowly improving.  Renal ultrasound was unremarkable.  Continue management as per nephrology.  Uremia -- Secondary to acute renal failure, slowly improving  Thrombocytopenia (HCC) --etiology unclear, check CBC in AM  Protein-calorie malnutrition, severe    Pressure injury of right buttock, stage 2 ([place])- (present on admission) - WOC  GERD (gastroesophageal reflux disease)- (present on admission) -Continue PPI  Advanced dementia- (present on admission) --At baseline, family report she is oriented to self and family, aware of home surroundings, can converse with them appropriately. -- Suspect very close to baseline  Acute metabolic encephalopathy-resolved as of 08/05/2021 See above baseline.  Here, she is somnolent due to uremia.  Stage 3b chronic kidney disease (CKD) (HCC)-resolved as of 08/05/2021 --Baseline GFR 33, Cr ~1.5  Distended abdomen CT showed distended bowel loops. No vomiting or pain to suggest symptomatic ileus, but I would expect one with a BUN >200.  - Continue IV fluids  DNR (do not resuscitate)/DNI(Do Not Intubate) Verified DNR/DNI status with dtr [name].  Depression -Hold Celexa  Primary hypertension BP normal off meds        Subjective: feels ok  Physical Exam: Vitals:   08/04/21 1923 08/05/21 0428 08/05/21 0836 08/05/21 1641  BP: (!) 125/58 (!) 126/56 (!) 144/57 (!) 141/95  Pulse: 76 78 (!) 41 78  Resp: 20 20 16 18    Temp: 98.1 F (36.7 C) 97.8 F (36.6 C) 98.8 F (37.1 C) 98.6 F (37 C)  TempSrc: Oral Oral Oral   SpO2: 100% 97% 93% 94%  Weight:      Height:       Physical Exam Vitals reviewed.  Constitutional:      General: She is not in acute distress.    Appearance: She is not ill-appearing or toxic-appearing.  Cardiovascular:     Rate and Rhythm: Normal rate and regular rhythm.     Heart sounds: No murmur heard. Pulmonary:     Effort: Pulmonary effort is normal. No respiratory distress.     Breath sounds: No wheezing, rhonchi or rales.  Musculoskeletal:     Right lower leg: No edema.     Left lower leg: No edema.  Psychiatric:        Mood and Affect: Mood normal.        Behavior: Behavior normal.     Data Reviewed:  Creatinine down to 9.66   Family Communication: none  Disposition: Status is: Inpatient  Remains inpatient appropriate because: acute renal failure    Planned Discharge Destination: [place] [facility]     Time spent: 25 minutes  Author: Murray Hodgkins, MD 08/05/2021 5:41 PM  For on call review [email or web address].

## 2021-08-05 NOTE — Progress Notes (Signed)
[facility]  PROGRESS NOTE   Subjective:   Awake and alert but confused. Patient is good urine output.  Objective:  Vital signs in last 24 hours:  Temp:  [97.8 F (36.6 C)-98.8 F (37.1 C)] 98.8 F (37.1 C) (02/05 0836) Pulse Rate:  [41-78] 41 (02/05 0836) Resp:  [16-20] 16 (02/05 0836) BP: (119-144)/(46-58) 144/57 (02/05 0836) SpO2:  [93 %-100 %] 93 % (02/05 0836)  Weight change:  Filed Weights   07/31/21 2338 08/02/21 0555 08/03/21 0438  Weight: 52.7 kg 64.8 kg 62.1 kg    Intake/Output: I/O last 3 completed shifts: In: -  Out: 2150 [Urine:2150]   Intake/Output this shift:  No intake/output data recorded.  Physical Exam: General:  No acute distress  Head:  Normocephalic, atraumatic. Moist oral mucosal membranes  Eyes:  Anicteric  Neck:  Supple  Lungs:   Clear to auscultation, normal effort  Heart:  S1S2 no rubs  Abdomen:   Soft, nontender, bowel sounds present  Extremities:  peripheral edema.  Neurologic:  Awake, alert, following commands  Skin:  No lesions  Access:     Basic Metabolic Panel: Recent Labs  Lab 08/01/21 0417 08/02/21 0426 08/03/21 0610 08/04/21 0729 08/05/21 0524  NA 141 144 143 144 148*  K 5.1 3.4* 3.1* 3.3* 3.5  CL 113* 109 98 100 103  CO2 11* 19* 29 32 32  GLUCOSE 78 115* 176* 113* 94  BUN 254* 224* 189* 173* 132*  CREATININE 13.17* 12.78* 11.67* 10.78* 9.66*  CALCIUM 8.1* 7.7* 7.6* 7.9* 8.3*  MG 3.4*  --  2.7*  --   --   PHOS  --   --  7.6* 5.8*  --     CBC: Recent Labs  Lab 07/31/21 1035 08/01/21 0417 08/03/21 0610 08/04/21 0729  WBC 10.3 7.6 12.0* 16.0*  NEUTROABS  --  6.2  --   --   HGB 13.0 10.5* 10.3* 10.1*  HCT 37.9 29.9* 29.5* 29.5*  MCV 85.0 86.4 83.8 87.8  PLT 200 159 124* 116*     Urinalysis: No results for input(s): COLORURINE, LABSPEC, PHURINE, GLUCOSEU, HGBUR, BILIRUBINUR, KETONESUR, PROTEINUR, UROBILINOGEN, NITRITE, LEUKOCYTESUR in the last 72 hours.  Invalid input(s): APPERANCEUR     Imaging: No results found.   Medications:    lactated ringers 100 mL/hr at 08/05/21 0845    Chlorhexidine Gluconate Cloth  6 each Topical Daily   feeding supplement (NEPRO CARB STEADY)  237 mL Oral TID WC   multivitamin  1 tablet Oral QHS   pantoprazole  20 mg Oral Daily    Assessment/ Plan:     Principal Problem:   Acute renal failure (ARF) (HCC) Active Problems:   Advanced dementia   GERD (gastroesophageal reflux disease)   Primary hypertension   Depression   Pressure injury of right buttock, stage 2 (HCC)   Stage 3b chronic kidney disease (CKD) (HCC)   Protein-calorie malnutrition, severe   Distended abdomen   Acute metabolic encephalopathy   Uremia  76 year old female with advanced dementia, hypertension, GERD and depression now admitted with history of mental status changes and found to have acute kidney injury.   #1: Acute kidney injury: Patient has acute kidney injury secondary to possible severe prerenal azotemia versus ATN.  Renal indicis are improving with IV fluids.  Urine output is much better at this time.  Patient now has possible diuretic phase of ATN.  Will need to monitor potassium and supplement as required.   #2: Hypokalemia: Potassium supplemented.  And  continue the LR 100 cc an hour.   #3: Metabolic acidosis: Now resolved.   #4: Anemia: Anemia possibly secondary to chronic kidney disease.  Will need p.o. iron supplements.  #5: Dementia: Patient is advanced dementia and hence patient is not a candidate for chronic dialysis.   Will continue to follow closely.   LOS: [place], [facility] 2/5/20232:15 PM

## 2021-08-05 NOTE — Assessment & Plan Note (Signed)
--  etiology unclear, check CBC in AM

## 2021-08-05 NOTE — Plan of Care (Signed)
  Problem: Coping: Goal: Level of anxiety will decrease Outcome: Progressing   Problem: Pain Managment: Goal: General experience of comfort will improve Outcome: Progressing   Problem: Safety: Goal: Ability to remain free from injury will improve Outcome: Progressing   

## 2021-08-06 DIAGNOSIS — N179 Acute kidney failure, unspecified: Secondary | ICD-10-CM | POA: Diagnosis not present

## 2021-08-06 MED ORDER — SODIUM CHLORIDE 0.45 % IV SOLN: INTRAVENOUS | Status: DC

## 2021-08-06 NOTE — Progress Notes (Signed)
[facility]  ROUNDING NOTE   Subjective:   Patient currently sitting up in bed, alert No family at bedside Patient denies nausea and vomiting Denies shortness of breath, remains on room air Patient was seen later in morning with 2 daughters at the bedside. Daughter states patient resides with another daughter that lives in town, they are visiting from [place]. They state their mother looks a lot better from when they first saw her on Saturday until today.  They also state her mentation has improved.  Creatinine currently 9.7 BUN 132 Urine output on day shift 550 mL   Objective:  Vital signs in last 24 hours:  Temp:  [98.2 F (36.8 C)-98.9 F (37.2 C)] 98.9 F (37.2 C) (02/06 0800) Pulse Rate:  [70-78] 71 (02/06 0800) Resp:  [18-20] 19 (02/06 0800) BP: (130-141)/(56-95) 133/69 (02/06 0800) SpO2:  [94 %-100 %] 99 % (02/06 0800) Weight:  [42.2 kg] 42.2 kg (02/06 0207)  Weight change:  Filed Weights   08/02/21 0555 08/03/21 0438 08/06/21 0207  Weight: 64.8 kg 62.1 kg 42.2 kg    Intake/Output: I/O last 3 completed shifts: In: 5341.6 [I.V.:5341.6] Out: 1350 [Urine:1350]   Intake/Output this shift:  Total I/O In: 180 [P.O.:180] Out: 950 [Urine:950]  Physical Exam: General: NAD  Head: Normocephalic, atraumatic. Moist oral mucosal membranes  Eyes: Anicteric  Lungs:  Clear to auscultation, normal effort  Heart: Regular rate and rhythm  Abdomen:  Soft, nontender  Extremities:  no peripheral edema.  Neurologic: Nonfocal, moving all four extremities  Skin: No lesions       Basic Metabolic Panel: Recent Labs  Lab 08/01/21 0417 08/02/21 0426 08/03/21 0610 08/04/21 0729 08/05/21 0524  NA 141 144 143 144 148*  K 5.1 3.4* 3.1* 3.3* 3.5  CL 113* 109 98 100 103  CO2 11* 19* 29 32 32  GLUCOSE 78 115* 176* 113* 94  BUN 254* 224* 189* 173* 132*  CREATININE 13.17* 12.78* 11.67* 10.78* 9.66*  CALCIUM 8.1* 7.7* 7.6* 7.9* 8.3*  MG 3.4*  --  2.7*   --   --   PHOS  --   --  7.6* 5.8*  --      Liver Function Tests: Recent Labs  Lab 07/31/21 1035 08/01/21 0417 08/03/21 0610 08/04/21 0729  AST 34 25 21  --   ALT 47* 34 23  --   ALKPHOS 127* 104 93  --   BILITOT 0.5 0.8 0.8  --   PROT 8.0 6.4* 6.0*  --   ALBUMIN 3.8 3.1* 2.8* 2.7*    No results for input(s): LIPASE, AMYLASE in the last 168 hours. No results for input(s): AMMONIA in the last 168 hours.  CBC: Recent Labs  Lab 07/31/21 1035 08/01/21 0417 08/03/21 0610 08/04/21 0729  WBC 10.3 7.6 12.0* 16.0*  NEUTROABS  --  6.2  --   --   HGB 13.0 10.5* 10.3* 10.1*  HCT 37.9 29.9* 29.5* 29.5*  MCV 85.0 86.4 83.8 87.8  PLT 200 159 124* 116*     Cardiac Enzymes: No results for input(s): CKTOTAL, CKMB, CKMBINDEX, TROPONINI in the last 168 hours.  BNP: Invalid input(s): POCBNP  CBG: Recent Labs  Lab 07/31/21 2047  GLUCAP 111*     Microbiology: Results for orders placed or performed during the hospital encounter of 07/31/21  Resp Panel by RT-PCR (Flu A&B, Covid) Nasopharyngeal Swab     Status: None   Collection Time: 07/31/21  3:01 PM   Specimen: Nasopharyngeal Swab; Nasopharyngeal(NP)  swabs in vial transport medium  Result Value Ref Range Status   SARS Coronavirus 2 by RT PCR NEGATIVE NEGATIVE Final    Comment: (NOTE) SARS-CoV-2 target nucleic acids are NOT DETECTED.  The SARS-CoV-2 RNA is generally detectable in upper respiratory specimens during the acute phase of infection. The lowest concentration of SARS-CoV-2 viral copies this assay can detect is 138 copies/mL. A negative result does not preclude SARS-Cov-2 infection and should not be used as the sole basis for treatment or other patient management decisions. A negative result may occur with  improper specimen collection/handling, submission of specimen other than nasopharyngeal swab, presence of viral mutation(s) within the areas targeted by this assay, and inadequate number of viral copies(<138  copies/mL). A negative result must be combined with clinical observations, patient history, and epidemiological information. The expected result is Negative.  Fact Sheet for Patients:  EntrepreneurPulse.com.au  Fact Sheet for Healthcare Providers:  IncredibleEmployment.be  This test is no t yet approved or cleared by the [place] FDA and  has been authorized for detection and/or diagnosis of SARS-CoV-2 by FDA under an Emergency Use Authorization (EUA). This EUA will remain  in effect (meaning this test can be used) for the duration of the COVID-19 declaration under Section 564(b)(1) of the Act, 21 U.S.C.section 360bbb-3(b)(1), unless the authorization is terminated  or revoked sooner.       Influenza A by PCR NEGATIVE NEGATIVE Final   Influenza B by PCR NEGATIVE NEGATIVE Final    Comment: (NOTE) The Xpert Xpress SARS-CoV-2/FLU/RSV plus assay is intended as an aid in the diagnosis of influenza from Nasopharyngeal swab specimens and should not be used as a sole basis for treatment. Nasal washings and aspirates are unacceptable for Xpert Xpress SARS-CoV-2/FLU/RSV testing.  Fact Sheet for Patients: EntrepreneurPulse.com.au  Fact Sheet for Healthcare Providers: IncredibleEmployment.be  This test is not yet approved or cleared by the [place] FDA and has been authorized for detection and/or diagnosis of SARS-CoV-2 by FDA under an Emergency Use Authorization (EUA). This EUA will remain in effect (meaning this test can be used) for the duration of the COVID-19 declaration under Section 564(b)(1) of the Act, 21 U.S.C. section 360bbb-3(b)(1), unless the authorization is terminated or revoked.  Performed at [facility], [address] [place], [place] [address]     Coagulation Studies: No results for input(s): LABPROT, INR in the last 72 hours.   Urinalysis: No results for input(s):  COLORURINE, LABSPEC, PHURINE, GLUCOSEU, HGBUR, BILIRUBINUR, KETONESUR, PROTEINUR, UROBILINOGEN, NITRITE, LEUKOCYTESUR in the last 72 hours.  Invalid input(s): APPERANCEUR     Imaging: No results found.   Medications:    sodium chloride 75 mL/hr at 08/06/21 1236    Chlorhexidine Gluconate Cloth  6 each Topical Daily   feeding supplement (NEPRO CARB STEADY)  237 mL Oral TID WC   multivitamin  1 tablet Oral QHS   pantoprazole  20 mg Oral Daily   acetaminophen **OR** acetaminophen  Assessment/ Plan:  [name] is a 76 y.o.  female  with a past medical history of GERD, hypertension, diabetes, and hyperlipidemia, who was admitted to [facility] on 07/31/2021 for Acute renal failure (ARF) ([place]) [N17.9] AKI (acute kidney injury) ([place]) [N17.9]   Acute Kidney Injury on chronic kidney disease stage IIIb with baseline creatinine 1.5 and GFR of 33 on 014/26/22.  Acute kidney injury secondary to dehydration, patient appears hypovolemic Chronic kidney disease risk factors include diabetes and hypertension. Renal ultrasound negative for obstruction No IV contrast exposure.  No indication for dialysis at this time.   -Adequate urine output continues.  Creatinine continues to improve slowly.  We will continue IV fluids and encourage patient to increase oral intake.  We will continue to monitor  Lab Results  Component Value Date   CREATININE 9.66 (H) 08/05/2021   CREATININE 10.78 (H) 08/04/2021   CREATININE 11.67 (H) 08/03/2021    Intake/Output Summary (Last 24 hours) at 08/06/2021 1427 Last data filed at 08/06/2021 1011 Gross per 24 hour  Intake 5521.56 ml  Output 1500 ml  Net 4021.56 ml    2. Metabolic acidosis.  Resolved with sodium bicarb infusion   3.  Anemia of chronic kidney disease Hemoglobin 10 point  4. 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    5.  Hypernatremia likely due to acute kidney injury.  We will change prescribed IV fluids  to half-normal saline at 75 mL/h.    LOS: 6 [place] 2/6/20232:27 PM

## 2021-08-06 NOTE — Progress Notes (Addendum)
Occupational Therapy Treatment Patient Details Name: Kaitlin Wagner MRN: [id number] DOB: May 24, 1946 Today's Date: 08/06/2021   History of present illness Pt is a 76 y.o. F  who was sent to ER by PCP for renal failure. Recent SNF stay, and per family pt with functional decline. PMH of advanced dementia, HTN, and CKD IIIb, sacral wound, HLD, DM2, TIA.   OT comments  Pt seen for OT treatment on this date. Upon arrival to room, pt awake and sitting upright in bed with daughter Kaitlin Wagner) present. Pt A&Ox1. Daughter reported that pt presents with improved cognition; able to state name of daughter at bedside and engage in conversation with this Chief Strategy Officer. Daughter also provided more information regarding pt's PLOF; at baseline, pt was independent with feeding, required SUPERVISION/SET-UP for grooming, and required MAX A for dressing and bathing. Per daughter, pt was walking short distances with RW and using power wheelchair for longer distances. Pt currently requires MAX A for supine>sit transfers d/t impaired cognition and decreased strength. Once seated EOB, pt initially demonstrated fair static sitting balance with b/l UE supported, however eventually required MIN-MOD A d/t significant posterior lean following x2 sit>stand attempts (to which she was unable to clear hips from bed). Following sit>stand attempts, pt was observed to have smear of stool on bed linens. Pt required MAX A+2 for bed-level peri-care. Pt continues to benefit from skilled OT services to maximize return to PLOF and minimize risk of future falls, injury, caregiver burden, and readmission. Plan to assess pt's progress towards OT goals at next session.   Recommendations for follow up therapy are one component of a multi-disciplinary discharge planning process, led by the attending physician.  Recommendations may be updated based on patient status, additional functional criteria and insurance authorization.    Follow Up Recommendations  Home  health OT (Pt is likely not appropriate short term rehab candidate.)    Assistance Recommended at Discharge Frequent or constant Supervision/Assistance  Patient can return home with the following  Two people to help with walking and/or transfers;Two people to help with bathing/dressing/bathroom;Assistance with cooking/housework;Assistance with feeding;Direct supervision/assist for medications management;Direct supervision/assist for financial management;Assist for transportation;Help with stairs or ramp for entrance   Equipment Recommendations  Other (comment) (Pt will likely need hoyer lift and hospital bed if returning home)       Precautions / Restrictions Precautions Precautions: Fall Restrictions Weight Bearing Restrictions: No       Mobility Bed Mobility Overal bed mobility: Needs Assistance Bed Mobility: Rolling, Supine to Sit, Sit to Supine Rolling: Max assist   Supine to sit: Max assist Sit to supine: Max assist, +2 for physical assistance   General bed mobility comments: maxAx2    Transfers Overall transfer level: Needs assistance   Transfers: Sit to/from Stand Sit to Stand: Max assist           General transfer comment: x2 attempts, but pt unable to clear hips from EOB d/t significant posterior lean (verbalized fear of falling)     Balance Overall balance assessment: Needs assistance Sitting-balance support: Bilateral upper extremity supported, Feet supported Sitting balance-Leahy Scale: Poor Sitting balance - Comments: Able to maintain static sitting balance without support initially, however required MIN-MOD A d/t posterior lean following sit>stand attempts Postural control: Posterior lean   Standing balance-Leahy Scale: Zero                             ADL either performed or assessed with clinical  judgement   ADL Overall ADL's : Needs assistance/impaired     Grooming: Oral care;Maximal assistance;Bed level Grooming Details (indicate  cue type and reason): to apply oral gel to lips             Lower Body Dressing: Maximal assistance;Bed level Lower Body Dressing Details (indicate cue type and reason): to don socks     Toileting- Clothing Manipulation and Hygiene: Maximal assistance;+2 for physical assistance;Bed level Toileting - Clothing Manipulation Details (indicate cue type and reason): requires +2 for rolling and peri-care              Cognition Arousal/Alertness: Awake/alert Behavior During Therapy: WFL for tasks assessed/performed, Anxious Overall Cognitive Status: History of cognitive impairments - at baseline                                 General Comments: A&Ox1. Daughter reporting pt with improved cognition this date; able to state name of daughter at bedside and engage in conservation with this Chief Strategy Officer. However, following bed mobility, pt became anxious, verbalizing fear of falling, and becoming resistive towards physical assistance                   Pertinent Vitals/ Pain       Pain Assessment Pain Assessment: PAINAD Breathing: normal Negative Vocalization: none Facial Expression: facial grimacing Body Language: tense, distressed pacing, fidgeting Consolability: no need to console PAINAD Score: 3 Pain Location: buttocks during perineal care Pain Descriptors / Indicators: Grimacing Pain Intervention(s): Limited activity within patient's tolerance, Monitored during session, Repositioned  Home Living Family/patient expects to be discharged to:: Private residence Living Arrangements: Children Available Help at Discharge: Family;Available 24 hours/day                         Home Equipment: Rolling Walker (2 wheels);Wheelchair - power   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          Frequency  Min 2X/week (OT to trial pt for 2-3 sessions for improvement)        Progress Toward Goals  OT Goals(current goals can now be found in the care  plan section)  Progress towards OT goals: OT to reassess next treatment  Acute Rehab OT Goals OT Goal Formulation: Patient unable to participate in goal setting Time For Goal Achievement: 08/16/21 Potential to Achieve Goals: Good  Plan Discharge plan remains appropriate;Frequency remains appropriate       AM-PAC OT "6 Clicks" Daily Activity     Outcome Measure   Help from another person eating meals?: A Lot Help from another person taking care of personal grooming?: A Lot Help from another person toileting, which includes using toliet, bedpan, or urinal?: Total Help from another person bathing (including washing, rinsing, drying)?: Total Help from another person to put on and taking off regular upper body clothing?: A Lot Help from another person to put on and taking off regular lower body clothing?: Total 6 Click Score: 9    End of Session Equipment Utilized During Treatment: Gait belt;Rolling walker (2 wheels)  OT Visit Diagnosis: Muscle weakness (generalized) (M62.81)   Activity Tolerance Patient tolerated treatment well   Patient Left in bed;with call bell/phone within reach;with bed alarm set;with family/visitor present;Other (comment) (with nephrologist)   Nurse Communication Mobility status        Time: 6256-3893 OT Time Calculation (min): 32 min  Charges: OT  General Charges $OT Visit: 1 Visit OT Treatments $Self Care/Home Management : 23-37 mins  Fredirick Maudlin, OTR/L [place]

## 2021-08-06 NOTE — Progress Notes (Signed)
°  Progress Note   Patient: Kaitlin Wagner BXU:[id number] DOB: Jul 08, 1945 DOA: 07/31/2021     6 DOS: the patient was seen and examined on 08/06/2021   Brief hospital course: 76 year old woman with advanced dementia presented with history of being less responsive, weaker and minimal appetite. Found to have creatinine of 13 from baseline of 1.6.  Admitted for acute renal failure. --Seen by nephrology, slowly improving with conservative management.  Assessment and Plan: * Acute renal failure (ARF) ([place])- (present on admission) - Slowly improving.  Renal ultrasound was unremarkable.  Continue management as per nephrology.  Thrombocytopenia ([place]) --etiology unclear, check CBC in AM  Uremia -- Secondary to acute renal failure, slowly improving  Distended abdomen CT showed distended bowel loops. No vomiting or pain to suggest symptomatic ileus, but I would expect one with a BUN >200.  - Continue IV fluids  Protein-calorie malnutrition, severe    Pressure injury of right buttock, stage 2 (HCC)- (present on admission) - WOC  DNR (do not resuscitate)/DNI(Do Not Intubate) Verified DNR/DNI status with dtr [name].  Depression -Hold Celexa  Primary hypertension BP normal off meds  GERD (gastroesophageal reflux disease)- (present on admission) -Continue PPI  Advanced dementia- (present on admission) --At baseline, family report she is oriented to self and family, aware of home surroundings, can converse with them appropriately. -- Suspect very close to baseline  Acute metabolic encephalopathy-resolved as of 08/05/2021 See above baseline.  Here, she is somnolent due to uremia.  Stage 3b chronic kidney disease (CKD) (HCC)-resolved as of 08/05/2021 --Baseline GFR 33, Cr ~1.5        Subjective: She was seen and examined at bedside.  She has no complaints.  Physical Exam: Vitals:   08/05/21 1935 08/06/21 0207 08/06/21 0405 08/06/21 0800  BP: (!) 136/59  (!) 130/56 133/69  Pulse:  78  70 71  Resp: 20  20 19   Temp: 98.5 F (36.9 C)  98.2 F (36.8 C) 98.9 F (37.2 C)  TempSrc: Oral  Oral Oral  SpO2: 100%  95% 99%  Weight:  42.2 kg    Height:       Physical Exam Vitals reviewed.  Constitutional:      General: She is not in acute distress.    Appearance: She is not ill-appearing or toxic-appearing.  Cardiovascular:     Rate and Rhythm: Normal rate and regular rhythm.     Heart sounds: No murmur heard. Pulmonary:     Effort: Pulmonary effort is normal. No respiratory distress.     Breath sounds: No wheezing, rhonchi or rales.  Musculoskeletal:     Right lower leg: No edema.     Left lower leg: No edema.  Psychiatric:        Mood and Affect: Mood normal.        Behavior: Behavior normal.   No significant change from prior exam  Data Reviewed:  Creatinine down to 9.66   Family Communication: none  Disposition: Status is: Inpatient  Remains inpatient appropriate because: acute renal failure    Planned Discharge Destination: [facility]    Time spent: 25 minutes  Author: Kayleen Memos, DO 08/06/2021 2:40 PM  For on call review [email or web address].

## 2021-08-06 NOTE — Plan of Care (Signed)
  Problem: Activity: Goal: Risk for activity intolerance will decrease Outcome: Progressing   Problem: Coping: Goal: Level of anxiety will decrease Outcome: Progressing   Problem: Pain Managment: Goal: General experience of comfort will improve Outcome: Progressing   Problem: Safety: Goal: Ability to remain free from injury will improve Outcome: Progressing   Problem: Skin Integrity: Goal: Risk for impaired skin integrity will decrease Outcome: Progressing   

## 2021-08-07 DIAGNOSIS — N179 Acute kidney failure, unspecified: Secondary | ICD-10-CM | POA: Diagnosis not present

## 2021-08-07 LAB — RENAL FUNCTION PANEL
Albumin: 3 g/dL — ABNORMAL LOW (ref 3.5–5.0)
Anion gap: 11 (ref 5–15)
BUN: 115 mg/dL — ABNORMAL HIGH (ref 8–23)
CO2: 31 mmol/L (ref 22–32)
Calcium: 8.5 mg/dL — ABNORMAL LOW (ref 8.9–10.3)
Chloride: 104 mmol/L (ref 98–111)
Creatinine, Ser: 7.49 mg/dL — ABNORMAL HIGH (ref 0.44–1.00)
GFR, Estimated: 5 mL/min — ABNORMAL LOW (ref >60)
Glucose, Bld: 81 mg/dL (ref 70–99)
Phosphorus: 5.3 mg/dL — ABNORMAL HIGH (ref 2.5–4.6)
Potassium: 4.3 mmol/L (ref 3.5–5.1)
Sodium: 146 mmol/L — ABNORMAL HIGH (ref 135–145)

## 2021-08-07 LAB — CBC
HCT: 38.4 % (ref 36.0–46.0)
Hemoglobin: 12.1 g/dL (ref 12.0–15.0)
MCH: 29.8 pg (ref 26.0–34.0)
MCHC: 31.5 g/dL (ref 30.0–36.0)
MCV: 94.6 fL (ref 80.0–100.0)
Platelets: 150 10*3/uL (ref 150–400)
RBC: 4.06 MIL/uL (ref 3.87–5.11)
RDW: 13 % (ref 11.5–15.5)
WBC: 13.7 10*3/uL — ABNORMAL HIGH (ref 4.0–10.5)
nRBC: 0 % (ref 0.0–0.2)

## 2021-08-07 MED ORDER — MIRTAZAPINE 15 MG PO TABS
7.5000 mg | ORAL_TABLET | Freq: Every day | ORAL | Status: DC
  Administered 2021-08-08 – 2021-08-15 (×8): 7.5 mg via ORAL
  Filled 2021-08-07 (×9): qty 1

## 2021-08-07 MED ORDER — HEPARIN SODIUM (PORCINE) 5000 UNIT/ML IJ SOLN
5000.0000 [IU] | Freq: Three times a day (TID) | INTRAMUSCULAR | Status: DC
  Administered 2021-08-07 – 2021-08-16 (×24): 5000 [IU] via SUBCUTANEOUS
  Filled 2021-08-07 (×25): qty 1

## 2021-08-07 NOTE — Progress Notes (Signed)
PROGRESS NOTE  Kaitlin Wagner HOZ:[id number] DOB: 22-May-1946 DOA: 07/31/2021 PCP: Marnee Guarneri, MD  HPI/Recap of past 22 hours:  76 year old woman with dementia presented with history of being less responsive, weaker with minimal appetite. Found to have creatinine of 13 from baseline of 1.6.  Admitted for acute renal failure. --Seen by nephrology, slowly improving with conservative management, on IV fluid.  08/07/21:  Seen and examined at her bedside.  She has no new complaints.  Her kidney function continues to improve.  External foley catheter in place.  Foley removed overnight.  Will obtain bladder scan to r/o acute urinary retention.  Assessment/Plan: Principal Problem:   Acute renal failure (ARF) (HCC) Active Problems:   Advanced dementia   GERD (gastroesophageal reflux disease)   Pressure injury of right buttock, stage 2 (HCC)   Protein-calorie malnutrition, severe   Uremia   Thrombocytopenia (HCC)   Aortic atherosclerosis (HCC)  Improving non oliguric acute renal failure (ARF) ([place])- (present on admission) - Slowly improving.  Renal ultrasound was unremarkable.  Continue management as per nephrology. Creatinine continues to downtrend 7.4 from 13 Continue IV fluid 1/2 NS at 75cc/hr Continue to closely monitor urine output strict I's and O's Foley catheter removed on 08/07/2021.  External Foley catheter in place.  Rule out acute urinary retention.  Mild hypervolemic hypernatremia Serum sodium 146 Continue half-normal saline, closely monitor serum sodium while on IV fluid Repeat renal function in the morning.  Hyperphosphatemia in the setting of AKI Serum phosphorus 5.3 May benefit from binders, defer to nephrology.  Leukocytosis, suspect reactive Nonseptic appearing White count is down trending Afebrile.   Resolved thrombocytopenia (HCC) Suspect in the setting of acute illness   Uremia -- Secondary to acute renal failure, slowly improving BUN 115 from greater than  200. Continue to monitor   Distended abdomen CT showed distended bowel loops. No vomiting or pain to suggest symptomatic ileus, but I would expect one with a BUN >200.   - Continue IV fluids We will continue to closely monitor   Protein-calorie malnutrition, severe Continue to encourage increase in oral protein calorie intake.  Pressure injury of right buttock, stage 2 ([place])- (present on admission) - WOC Continue local wound care   DNR (do not resuscitate)/DNI(Do Not Intubate) Verified DNR/DNI status with dtr [name].   Depression -Hold Celexa Started on Remeron on 08/07/21 Per psychiatry to stimulate appetite   Primary hypertension BP normal off meds   GERD (gastroesophageal reflux disease)- (present on admission) -Continue PPI   Advanced dementia- (present on admission) --At baseline, family report she is oriented to self and family, aware of home surroundings, can converse with them appropriately. -- Suspect very close to baseline   Acute metabolic encephalopathy-resolved as of 08/05/2021 See above baseline.                Code Status: DNR  Family Communication: Updated daughter at bedside.  Disposition Plan: Likely will discharge to home, patient is bedbound and goes to adult daycare 5 days a week.   Consultants: Nephrology  Procedures: None  Antimicrobials: None  DVT prophylaxis: SCDs, subcu heparin 3 times daily  Status is: Inpatient  Patient requires at least 2 midnights for further evaluation and treatment of present condition.      Objective: Vitals:   08/06/21 1918 08/07/21 0241 08/07/21 0453 08/07/21 0744  BP: (!) 149/83 (!) 120/96  (!) 131/46  Pulse: 72 71  68  Resp: 20 18    Temp: 97.8 F (36.6 C)  97.6 F (36.4 C)  98.2 F (36.8 C)  TempSrc:      SpO2: 99% 99%  98%  Weight:   42.6 kg   Height:        Intake/Output Summary (Last 24 hours) at 08/07/2021 1514 Last data filed at 08/07/2021 1419 Gross per 24 hour  Intake  403.38 ml  Output 1100 ml  Net -696.62 ml   Filed Weights   08/03/21 0438 08/06/21 0207 08/07/21 0453  Weight: 62.1 kg 42.2 kg 42.6 kg    Exam:  General: 76 y.o. year-old female frail-appearing no acute distress.  She is alert and interactive. Cardiovascular: Regular rate and rhythm no rubs or gallops.   Respiratory: Clear to auscultation no wheezing or rales. Sharlee Blew: Soft nontender normal bowel sounds with improvement  musculoskeletal: No lower extremity edema bilaterally.   Skin: No ulcerative lesions noted. Psychiatry: Mood is appropriate for condition    Data Reviewed: CBC: Recent Labs  Lab 08/01/21 0417 08/03/21 0610 08/04/21 0729 08/07/21 0554  WBC 7.6 12.0* 16.0* 13.7*  NEUTROABS 6.2  --   --   --   HGB 10.5* 10.3* 10.1* 12.1  HCT 29.9* 29.5* 29.5* 38.4  MCV 86.4 83.8 87.8 94.6  PLT 159 124* 116* 409   Basic Metabolic Panel: Recent Labs  Lab 08/01/21 0417 08/02/21 0426 08/03/21 0610 08/04/21 0729 08/05/21 0524 08/07/21 0554  NA 141 144 143 144 148* 146*  K 5.1 3.4* 3.1* 3.3* 3.5 4.3  CL 113* 109 98 100 103 104  CO2 11* 19* 29 32 32 31  GLUCOSE 78 115* 176* 113* 94 81  BUN 254* 224* 189* 173* 132* 115*  CREATININE 13.17* 12.78* 11.67* 10.78* 9.66* 7.49*  CALCIUM 8.1* 7.7* 7.6* 7.9* 8.3* 8.5*  MG 3.4*  --  2.7*  --   --   --   PHOS  --   --  7.6* 5.8*  --  5.3*   GFR: Estimated Creatinine Clearance: 4.3 mL/min (A) (by C-G formula based on SCr of 7.49 mg/dL (H)). Liver Function Tests: Recent Labs  Lab 08/01/21 0417 08/03/21 0610 08/04/21 0729 08/07/21 0554  AST 25 21  --   --   ALT 34 23  --   --   ALKPHOS 104 93  --   --   BILITOT 0.8 0.8  --   --   PROT 6.4* 6.0*  --   --   ALBUMIN 3.1* 2.8* 2.7* 3.0*   No results for input(s): LIPASE, AMYLASE in the last 168 hours. No results for input(s): AMMONIA in the last 168 hours. Coagulation Profile: Recent Labs  Lab 08/03/21 0610  INR 1.2   Cardiac Enzymes: No results for input(s):  CKTOTAL, CKMB, CKMBINDEX, TROPONINI in the last 168 hours. BNP (last 3 results) No results for input(s): PROBNP in the last 8760 hours. HbA1C: No results for input(s): HGBA1C in the last 72 hours. CBG: Recent Labs  Lab 07/31/21 2047  GLUCAP 111*   Lipid Profile: No results for input(s): CHOL, HDL, LDLCALC, TRIG, CHOLHDL, LDLDIRECT in the last 72 hours. Thyroid Function Tests: No results for input(s): TSH, T4TOTAL, FREET4, T3FREE, THYROIDAB in the last 72 hours. Anemia Panel: No results for input(s): VITAMINB12, FOLATE, FERRITIN, TIBC, IRON, RETICCTPCT in the last 72 hours. Urine analysis:    Component Value Date/Time   COLORURINE YELLOW 07/31/2021 [place] 07/31/2021 1352   LABSPEC 1.010 07/31/2021 1352   PHURINE 5.0 07/31/2021 1352   [place] 07/31/2021 1352  HGBUR TRACE (A) 07/31/2021 1352   BILIRUBINUR NEGATIVE 07/31/2021 1352   [place] 07/31/2021 1352   PROTEINUR NEGATIVE 07/31/2021 1352   NITRITE NEGATIVE 07/31/2021 1352   LEUKOCYTESUR SMALL (A) 07/31/2021 1352   Sepsis Labs: @LABRCNTIP (procalcitonin:4,lacticidven:4)  ) Recent Results (from the past 240 hour(s))  Resp Panel by RT-PCR (Flu A&B, Covid) Nasopharyngeal Swab     Status: None   Collection Time: 07/31/21  3:01 PM   Specimen: Nasopharyngeal Swab; Nasopharyngeal(NP) swabs in vial transport medium  Result Value Ref Range Status   SARS Coronavirus 2 by RT PCR NEGATIVE NEGATIVE Final    Comment: (NOTE) SARS-CoV-2 target nucleic acids are NOT DETECTED.  The SARS-CoV-2 RNA is generally detectable in upper respiratory specimens during the acute phase of infection. The lowest concentration of SARS-CoV-2 viral copies this assay can detect is 138 copies/mL. A negative result does not preclude SARS-Cov-2 infection and should not be used as the sole basis for treatment or other patient management decisions. A negative result may occur with  improper specimen  collection/handling, submission of specimen other than nasopharyngeal swab, presence of viral mutation(s) within the areas targeted by this assay, and inadequate number of viral copies(<138 copies/mL). A negative result must be combined with clinical observations, patient history, and epidemiological information. The expected result is Negative.  Fact Sheet for Patients:  EntrepreneurPulse.com.au  Fact Sheet for Healthcare Providers:  IncredibleEmployment.be  This test is no t yet approved or cleared by the [place] FDA and  has been authorized for detection and/or diagnosis of SARS-CoV-2 by FDA under an Emergency Use Authorization (EUA). This EUA will remain  in effect (meaning this test can be used) for the duration of the COVID-19 declaration under Section 564(b)(1) of the Act, 21 U.S.C.section 360bbb-3(b)(1), unless the authorization is terminated  or revoked sooner.       Influenza A by PCR NEGATIVE NEGATIVE Final   Influenza B by PCR NEGATIVE NEGATIVE Final    Comment: (NOTE) The Xpert Xpress SARS-CoV-2/FLU/RSV plus assay is intended as an aid in the diagnosis of influenza from Nasopharyngeal swab specimens and should not be used as a sole basis for treatment. Nasal washings and aspirates are unacceptable for Xpert Xpress SARS-CoV-2/FLU/RSV testing.  Fact Sheet for Patients: EntrepreneurPulse.com.au  Fact Sheet for Healthcare Providers: IncredibleEmployment.be  This test is not yet approved or cleared by the [place] FDA and has been authorized for detection and/or diagnosis of SARS-CoV-2 by FDA under an Emergency Use Authorization (EUA). This EUA will remain in effect (meaning this test can be used) for the duration of the COVID-19 declaration under Section 564(b)(1) of the Act, 21 U.S.C. section 360bbb-3(b)(1), unless the authorization is terminated or revoked.  Performed at [facility], [address] [place], [place] [address]       Studies: No results found.  Scheduled Meds:  Chlorhexidine Gluconate Cloth  6 each Topical Daily   feeding supplement (NEPRO CARB STEADY)  237 mL Oral TID WC   mirtazapine  7.5 mg Oral QHS   multivitamin  1 tablet Oral QHS   pantoprazole  20 mg Oral Daily    Continuous Infusions:  sodium chloride 75 mL/hr at 08/07/21 1505     LOS: 7 days     Kayleen Memos, MD [facility] Pager [phone]  If 7PM-7AM, please contact night-coverage [email or web address] Password [facility] 08/07/2021, 3:14 PM

## 2021-08-07 NOTE — Progress Notes (Addendum)
Reported acute urinary retention by bedside RN with greater than 300 urine retained in the bladder.  Patient is unable to void.  Replace indwelling Foley catheter on 08/07/21.  Addendum:  Patient voided spontaneously and foley catheter placement was canceled.  We will continue to closely monitor.

## 2021-08-07 NOTE — Progress Notes (Signed)
Patient's foley was leaking so I contacted NP on call to see if I could remove it and place an external catheter.  Order was placed for removal.  Patient output was 450 mL.  Will continue to monitor.  Christene Slates

## 2021-08-07 NOTE — Progress Notes (Addendum)
Physical Therapy Treatment Patient Details Name: Kaitlin Wagner MRN: [id number] DOB: 27-Jan-1946 Today's Date: 08/07/2021   History of Present Illness Pt is a 76 y.o. F  who was sent to ER by PCP for renal failure. Recent SNF stay, and per family pt with functional decline. PMH of advanced dementia, HTN, and CKD IIIb, sacral wound, HLD, DM2, TIA.    PT Comments    Pt resting in bed upon PT entrance into room. A&Ox2; oriented to self and reports is in hospital but cannot recall which one. Facial pain scale was used for pain assessment throughout session. She is TotalAx2 to transfer w/ bed mobility. She was unable to maintain static sitting balance at EOB and required minA-modAx1 due to posterior leaning; however after verbal encouragement was able to static sit w/ CGA of LE for ~3 seconds. Further mobility attempts were deferred by PT due to Pt visual signs of anxiousness and impulsivity to return to supine. Pt will benefit from continued skilled PT in order to improve LE strength, mobility, and restore PLOF. Current discharge recommendation remains appropriate due to the level of assistance required by the patient to ensure safety and improve overall function.   Recommendations for follow up therapy are one component of a multi-disciplinary discharge planning process, led by the attending physician.  Recommendations may be updated based on patient status, additional functional criteria and insurance authorization.  Follow Up Recommendations  Other (comment) (higher levels of care with 24/7 supervision/assistance for all ADLs/mobility)     Assistance Recommended at Discharge Frequent or constant Supervision/Assistance  Patient can return home with the following Two people to help with walking and/or transfers;Two people to help with bathing/dressing/bathroom;Direct supervision/assist for medications management;Help with stairs or ramp for entrance;Assist for transportation;Assistance with  feeding;Assistance with cooking/housework;Direct supervision/assist for financial management   Equipment Recommendations  Other (comment) (TBD, anticipate hoyer lift if returning home)    Recommendations for Other Services       Precautions / Restrictions Precautions Precautions: Fall Restrictions Weight Bearing Restrictions: No     Mobility  Bed Mobility Overal bed mobility: Needs Assistance Bed Mobility: Supine to Sit, Sit to Supine     Supine to sit: +2 for physical assistance, Total assist Sit to supine: +2 for physical assistance, Total assist   General bed mobility comments: TotalAx2    Transfers                        Ambulation/Gait                   Stairs             Wheelchair Mobility    Modified Rankin (Stroke Patients Only)       Balance Overall balance assessment: Needs assistance Sitting-balance support: Bilateral upper extremity supported, Feet supported Sitting balance-Leahy Scale: Poor Sitting balance - Comments: initially required MIN-MOD A d/t posterior lean once @ EOB; able to maintain static sitting balance for ~3seconds after. Postural control: Posterior lean   Standing balance-Leahy Scale: Zero                              Cognition Arousal/Alertness: Awake/alert Behavior During Therapy: WFL for tasks assessed/performed, Anxious Overall Cognitive Status: History of cognitive impairments - at baseline  General Comments: A&Ox2. Oriented to herself, and was able to report she was in the hospital, but not able to recall which one.        Exercises      General Comments        Pertinent Vitals/Pain Pain Assessment Pain Assessment: Faces Faces Pain Scale: Hurts a little bit Pain Location: generalized LE discomfort w/ mobility attempts Pain Descriptors / Indicators: Grimacing Pain Intervention(s): Limited activity within patient's tolerance,  Monitored during session, Repositioned    Home Living                          Prior Function            PT Goals (current goals can now be found in the care plan section) Progress towards PT goals: Progressing toward goals    Frequency    Min 2X/week      PT Plan Current plan remains appropriate    Co-evaluation              AM-PAC PT "6 Clicks" Mobility   Outcome Measure  Help needed turning from your back to your side while in a flat bed without using bedrails?: Total Help needed moving from lying on your back to sitting on the side of a flat bed without using bedrails?: Total Help needed moving to and from a bed to a chair (including a wheelchair)?: Total Help needed standing up from a chair using your arms (e.g., wheelchair or bedside chair)?: Total Help needed to walk in hospital room?: Total Help needed climbing 3-5 steps with a railing? : Total 6 Click Score: 6    End of Session Equipment Utilized During Treatment: Gait belt Activity Tolerance: Other (comment) (Patient limited by anxiety) Patient left: in bed;with call bell/phone within reach;with nursing/sitter in room;with bed alarm set Nurse Communication: Mobility status PT Visit Diagnosis: Other abnormalities of gait and mobility (R26.89);Difficulty in walking, not elsewhere classified (R26.2);Muscle weakness (generalized) (M62.81)     Time: 0981-1914 PT Time Calculation (min) (ACUTE ONLY): 16 min  Charges:  $Therapeutic Activity: 8-22 mins                      Jonnie Kind, SPT 08/07/2021, 12:16 PM

## 2021-08-07 NOTE — TOC Progression Note (Addendum)
Transition of Care [facility]) - Progression Note    Patient Details  Name: Kaitlin Wagner MRN: [id number] Date of Birth: 1945-10-01  [facility]) CM/SW Amelia, LCSW Phone Number: 08/07/2021, 10:06 AM  Clinical Narrative: Provided update to Neill Loft, RN at [facility]. She said disposition plan will depend on level of care needed. Notified her of recommendation for hoyer lift if she returns home.  1:04 pm: Patient required total assist x 2 today with PT. Notified Stacy.  Expected Discharge Plan:  (TBD) Barriers to Discharge: Continued Medical Work up  Expected Discharge Plan and Services Expected Discharge Plan:  (TBD)       Living arrangements for the past 2 months: Single Family Home                                       Social Determinants of Health (SDOH) Interventions    Readmission Risk Interventions No flowsheet data found.

## 2021-08-07 NOTE — Progress Notes (Signed)
Palliative:    Chart review completed.  Kaitlin Wagner [facility].  They work very closely with patients and families for goals of care.  Patient comes to their facility 5 days a week and has supervision from her family the rest of the time which equates to 24/7.   Transition of care team is working closely for disposition.  Conference with attending, bedside nursing staff, transition of care related to patient condition, needs, goals of care, disposition.  Plan:   Kaitlin Wagner is [facility] with [facility].  At this point, she is treat the treatable but no CPR or intubation.  Transition of care team is working closely with [facility] for discharge needs.    No charge   Quinn Axe, NP Palliative medicine team Team phone [phone] Greater than 50% of this time was spent counseling and coordinating care related to the above assessment and plan.

## 2021-08-07 NOTE — Progress Notes (Signed)
Nutrition Follow-up  DOCUMENTATION CODES:   Severe malnutrition in context of chronic illness  INTERVENTION:   If family wishes to continue with full aggressive care, recommend nasogastric tube and nutrition support.   If nasogastric tube placed, recommend:  Osmolite 1.5@50ml /hr- Initiate at 22m/hr and increase by 174mhr q 8 hours until goal rate is reached.   Pro-Source 4514maily via tube, provides 40kcal and 11g of protein per serving   Free water flushes 100m50m hours via tube   Regimen provides 1840kcal/day, 86g/day protein and 1514ml30m of free water   Pt at high refeed risk; recommend monitor potassium, magnesium and phosphorus labs daily until stable  Juven Fruit Punch BID via tube, each serving provides 95kcal and 2.5g of protein (amino acids glutamine and arginine)  NUTRITION DIAGNOSIS:   Severe Malnutrition related to chronic illness (dementia) as evidenced by severe fat depletion, severe muscle depletion, energy intake < or equal to 50% for > or equal to 5 days.  GOAL:   Patient will meet greater than or equal to 90% of their needs -not met   MONITOR:   PO intake, Supplement acceptance, Labs, Weight trends, Skin, I & O's  ASSESSMENT:   75 yo65emale with a PMH of dementia, hypertension, hyperlipidemia, Barretts esophagus, CKD stage 3b, GERD, colon cancer, sacral decubitus, rectal fissures and depression who was admitted on 07/31/2021 with acute renal failure.  Met with pt in room today. Pt is a poor historian but reports that she ate some lunch today and drank a vanilla Nepro. RN reports that pt only ate bites of her food and refused the Nepro. Pt documented to be eating only bites her entire admission. Palliative care following; family requesting full care. Would recommend nasogastric tube placement and nutrition support to see if this will increase pt's appetite and oral intake; this was discussed with MD. Pt was started on remeron today. Pt is at high refeed  risk. MD will discuss feeding tube placement with pt's family. Recommend Juven to support wound healing.   Pt's documented weights in chart do not appear to be correct. Pt documented to be ~93lbs but does not appear this weight. Pt's UBW appears to be ~160lbs.   Medications reviewed and include: heparin, remeron, rena-vit, protonix, NaCl @75ml /hr   Labs reviewed: Na 146(H), K 4.3 wnl, BUN 115(H), creat 7.49(H) Wbc- 13.7(H)  Diet Order:   Diet Order             DIET - DYS 1 Room service appropriate? Yes; Fluid consistency: Thin  Diet effective now                  EDUCATION NEEDS:   Education needs have been addressed  Skin:  Skin Assessment: Skin Integrity Issues: Skin Integrity Issues:: Stage II Stage II: Buttocks  Last BM:  2/6- type 7  Height:   Ht Readings from Last 1 Encounters:  07/31/21 5' 6"  (1.676 m)    Weight:   Wt Readings from Last 1 Encounters:  08/07/21 42.6 kg    BMI:  Body mass index is 15.16 kg/m.  Estimated Nutritional Needs:   Kcal:  1600-1800kcal/day  Protein:  80-90g/day  Fluid:  1.5-1.8L/day  CaseyKoleen DistanceRD, LDN Please refer to [facility] and/or RD on-call/weekend/after hours pager

## 2021-08-07 NOTE — Progress Notes (Signed)
[facility]  ROUNDING NOTE   Subjective:   Patient resting quietly, alert States she is not hungry and refuses meal Denies shortness of breath No family at bedside   Creatinine 7.5 Urine output of 2L in past 24 hours Sodium 146 IVF 1/2 NS at 59ml/hr   Objective:  Vital signs in last 24 hours:  Temp:  [97.6 F (36.4 C)-98.2 F (36.8 C)] 98.2 F (36.8 C) (02/07 0744) Pulse Rate:  [68-72] 68 (02/07 0744) Resp:  [18-20] 18 (02/07 0241) BP: (120-150)/(46-96) 131/46 (02/07 0744) SpO2:  [98 %-99 %] 98 % (02/07 0744) Weight:  [42.6 kg] 42.6 kg (02/07 0453)  Weight change: 0.4 kg Filed Weights   08/03/21 0438 08/06/21 0207 08/07/21 0453  Weight: 62.1 kg 42.2 kg 42.6 kg    Intake/Output: I/O last 3 completed shifts: In: 5924.9 [P.O.:360; I.V.:5564.9] Out: 2050 [Urine:2050]   Intake/Output this shift:  No intake/output data recorded.  Physical Exam: General: NAD  Head: Normocephalic, atraumatic. Moist oral mucosal membranes  Eyes: Anicteric  Lungs:  Clear to auscultation, normal effort  Heart: Regular rate and rhythm  Abdomen:  Soft, nontender  Extremities:  no peripheral edema.  Neurologic: Nonfocal, moving all four extremities  Skin: No lesions       Basic Metabolic Panel: Recent Labs  Lab 08/01/21 0417 08/02/21 0426 08/03/21 0610 08/04/21 0729 08/05/21 0524 08/07/21 0554  NA 141 144 143 144 148* 146*  K 5.1 3.4* 3.1* 3.3* 3.5 4.3  CL 113* 109 98 100 103 104  CO2 11* 19* 29 32 32 31  GLUCOSE 78 115* 176* 113* 94 81  BUN 254* 224* 189* 173* 132* 115*  CREATININE 13.17* 12.78* 11.67* 10.78* 9.66* 7.49*  CALCIUM 8.1* 7.7* 7.6* 7.9* 8.3* 8.5*  MG 3.4*  --  2.7*  --   --   --   PHOS  --   --  7.6* 5.8*  --  5.3*     Liver Function Tests: Recent Labs  Lab 08/01/21 0417 08/03/21 0610 08/04/21 0729 08/07/21 0554  AST 25 21  --   --   ALT 34 23  --   --   ALKPHOS 104 93  --   --   BILITOT 0.8 0.8  --   --   PROT 6.4* 6.0*  --    --   ALBUMIN 3.1* 2.8* 2.7* 3.0*    No results for input(s): LIPASE, AMYLASE in the last 168 hours. No results for input(s): AMMONIA in the last 168 hours.  CBC: Recent Labs  Lab 08/01/21 0417 08/03/21 0610 08/04/21 0729 08/07/21 0554  WBC 7.6 12.0* 16.0* 13.7*  NEUTROABS 6.2  --   --   --   HGB 10.5* 10.3* 10.1* 12.1  HCT 29.9* 29.5* 29.5* 38.4  MCV 86.4 83.8 87.8 94.6  PLT 159 124* 116* 150     Cardiac Enzymes: No results for input(s): CKTOTAL, CKMB, CKMBINDEX, TROPONINI in the last 168 hours.  BNP: Invalid input(s): POCBNP  CBG: Recent Labs  Lab 07/31/21 2047  GLUCAP 111*     Microbiology: Results for orders placed or performed during the hospital encounter of 07/31/21  Resp Panel by RT-PCR (Flu A&B, Covid) Nasopharyngeal Swab     Status: None   Collection Time: 07/31/21  3:01 PM   Specimen: Nasopharyngeal Swab; Nasopharyngeal(NP) swabs in vial transport medium  Result Value Ref Range Status   SARS Coronavirus 2 by RT PCR NEGATIVE NEGATIVE Final    Comment: (NOTE) SARS-CoV-2 target nucleic  acids are NOT DETECTED.  The SARS-CoV-2 RNA is generally detectable in upper respiratory specimens during the acute phase of infection. The lowest concentration of SARS-CoV-2 viral copies this assay can detect is 138 copies/mL. A negative result does not preclude SARS-Cov-2 infection and should not be used as the sole basis for treatment or other patient management decisions. A negative result may occur with  improper specimen collection/handling, submission of specimen other than nasopharyngeal swab, presence of viral mutation(s) within the areas targeted by this assay, and inadequate number of viral copies(<138 copies/mL). A negative result must be combined with clinical observations, patient history, and epidemiological information. The expected result is Negative.  Fact Sheet for Patients:  EntrepreneurPulse.com.au  Fact Sheet for Healthcare  Providers:  IncredibleEmployment.be  This test is no t yet approved or cleared by the [place] FDA and  has been authorized for detection and/or diagnosis of SARS-CoV-2 by FDA under an Emergency Use Authorization (EUA). This EUA will remain  in effect (meaning this test can be used) for the duration of the COVID-19 declaration under Section 564(b)(1) of the Act, 21 U.S.C.section 360bbb-3(b)(1), unless the authorization is terminated  or revoked sooner.       Influenza A by PCR NEGATIVE NEGATIVE Final   Influenza B by PCR NEGATIVE NEGATIVE Final    Comment: (NOTE) The Xpert Xpress SARS-CoV-2/FLU/RSV plus assay is intended as an aid in the diagnosis of influenza from Nasopharyngeal swab specimens and should not be used as a sole basis for treatment. Nasal washings and aspirates are unacceptable for Xpert Xpress SARS-CoV-2/FLU/RSV testing.  Fact Sheet for Patients: EntrepreneurPulse.com.au  Fact Sheet for Healthcare Providers: IncredibleEmployment.be  This test is not yet approved or cleared by the [place] FDA and has been authorized for detection and/or diagnosis of SARS-CoV-2 by FDA under an Emergency Use Authorization (EUA). This EUA will remain in effect (meaning this test can be used) for the duration of the COVID-19 declaration under Section 564(b)(1) of the Act, 21 U.S.C. section 360bbb-3(b)(1), unless the authorization is terminated or revoked.  Performed at [facility], [address] [place], [place] [address]     Coagulation Studies: No results for input(s): LABPROT, INR in the last 72 hours.   Urinalysis: No results for input(s): COLORURINE, LABSPEC, PHURINE, GLUCOSEU, HGBUR, BILIRUBINUR, KETONESUR, PROTEINUR, UROBILINOGEN, NITRITE, LEUKOCYTESUR in the last 72 hours.  Invalid input(s): APPERANCEUR     Imaging: No results found.   Medications:    sodium chloride 75 mL/hr at  08/07/21 0058    Chlorhexidine Gluconate Cloth  6 each Topical Daily   feeding supplement (NEPRO CARB STEADY)  237 mL Oral TID WC   mirtazapine  7.5 mg Oral QHS   multivitamin  1 tablet Oral QHS   pantoprazole  20 mg Oral Daily   acetaminophen **OR** acetaminophen  Assessment/ Plan:  Ms. Cassity Christian is a 76 y.o.  female  with a past medical history of GERD, hypertension, diabetes, and hyperlipidemia, who was admitted to [facility] on 07/31/2021 for Acute renal failure (ARF) ([place]) [N17.9] AKI (acute kidney injury) ([place]) [N17.9]   Acute Kidney Injury on chronic kidney disease stage IIIb with baseline creatinine 1.5 and GFR of 33 on 014/26/22.  Acute kidney injury secondary to dehydration, patient appears hypovolemic Chronic kidney disease risk factors include diabetes and hypertension. Renal ultrasound negative for obstruction No IV contrast exposure.  No indication for dialysis at this time.   -Creatinine continues to decline. Adequate urine output recorded.  -Continue IVF -Avoid  nephrotoxic agents and therapies -Encouraged improved oral intake, will prescribe Remeron 7.5mg  po daily as appetite stimulant.   Lab Results  Component Value Date   CREATININE 7.49 (H) 08/07/2021   CREATININE 9.66 (H) 08/05/2021   CREATININE 10.78 (H) 08/04/2021    Intake/Output Summary (Last 24 hours) at 08/07/2021 1235 Last data filed at 08/07/2021 0454 Gross per 24 hour  Intake 403.38 ml  Output 1100 ml  Net -696.62 ml    2. Metabolic acidosis.  Resolved with sodium bicarb infusion   3.  Anemia of chronic kidney disease Improved to 12.1  4. 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    5.  Hypernatremia likely due to acute kidney injury.  Continue 1/2 normal saline at 75 mL/h. Sodium improved to 146    LOS: 7   2/7/202312:35 PM

## 2021-08-08 DIAGNOSIS — N179 Acute kidney failure, unspecified: Secondary | ICD-10-CM | POA: Diagnosis not present

## 2021-08-08 LAB — RENAL FUNCTION PANEL
Albumin: 2.8 g/dL — ABNORMAL LOW (ref 3.5–5.0)
Anion gap: 12 (ref 5–15)
BUN: 98 mg/dL — ABNORMAL HIGH (ref 8–23)
CO2: 29 mmol/L (ref 22–32)
Calcium: 8.4 mg/dL — ABNORMAL LOW (ref 8.9–10.3)
Chloride: 104 mmol/L (ref 98–111)
Creatinine, Ser: 6.62 mg/dL — ABNORMAL HIGH (ref 0.44–1.00)
GFR, Estimated: 6 mL/min — ABNORMAL LOW (ref >60)
Glucose, Bld: 70 mg/dL (ref 70–99)
Phosphorus: 5.4 mg/dL — ABNORMAL HIGH (ref 2.5–4.6)
Potassium: 4.4 mmol/L (ref 3.5–5.1)
Sodium: 145 mmol/L (ref 135–145)

## 2021-08-08 LAB — CBC
HCT: 37.1 % (ref 36.0–46.0)
Hemoglobin: 11.7 g/dL — ABNORMAL LOW (ref 12.0–15.0)
MCH: 30.5 pg (ref 26.0–34.0)
MCHC: 31.5 g/dL (ref 30.0–36.0)
MCV: 96.6 fL (ref 80.0–100.0)
Platelets: 151 10*3/uL (ref 150–400)
RBC: 3.84 MIL/uL — ABNORMAL LOW (ref 3.87–5.11)
RDW: 12.7 % (ref 11.5–15.5)
WBC: 15.5 10*3/uL — ABNORMAL HIGH (ref 4.0–10.5)
nRBC: 0 % (ref 0.0–0.2)

## 2021-08-08 MED ORDER — JUVEN PO PACK: 1.0000 | PACK | Freq: Two times a day (BID) | ORAL | Status: DC

## 2021-08-08 MED ORDER — FREE WATER: 30.0000 mL | Status: DC

## 2021-08-08 MED ORDER — PROSOURCE TF PO LIQD
45.0000 mL | Freq: Every day | ORAL | Status: DC
  Filled 2021-08-08 (×2): qty 45

## 2021-08-08 MED ORDER — OSMOLITE 1.5 CAL PO LIQD: 1000.0000 mL | ORAL | Status: DC

## 2021-08-08 NOTE — Progress Notes (Signed)
Occupational Therapy Treatment Patient Details Name: Kaitlin Wagner MRN: [id number] DOB: [birth year] Today's Date: 08/08/2021   History of present illness Pt is a 76 y.o. F  who was sent to ER by PCP for renal failure. Recent SNF stay, and per family pt with functional decline. PMH of advanced dementia, HTN, and CKD IIIb, sacral wound, HLD, DM2, TIA.   OT comments  Upon entering the room, pt supine in bed and alert. Pt is oriented to self and agreeable to OT intervention when asked about sitting on EOB. Bed set up and total A towards EOB with pt then yelling , " No! No!" . Therapist returned pt to bed with total A. Pt is given warm cloth and washes face with R UE with set up A and cuing to sequence task. Pt declined further intervention and RN notified. RN also reports pt has been refusing medication, drink, and food today. OT to re-assess pt next session for appropriateness. Minimal progress and participation to date.    Recommendations for follow up therapy are one component of a multi-disciplinary discharge planning process, led by the attending physician.  Recommendations may be updated based on patient status, additional functional criteria and insurance authorization.    Follow Up Recommendations  Home health OT    Assistance Recommended at Discharge Frequent or constant Supervision/Assistance  Patient can return home with the following  Two people to help with walking and/or transfers;Two people to help with bathing/dressing/bathroom;Assistance with cooking/housework;Assistance with feeding;Direct supervision/assist for medications management;Direct supervision/assist for financial management;Assist for transportation;Help with stairs or ramp for entrance   Equipment Recommendations  Other (comment) (will likely need hoyer lift and hospital bed if returning to home)       Precautions / Restrictions Precautions Precautions: Fall       Mobility Bed Mobility Overal bed mobility:  Needs Assistance Bed Mobility: Rolling Rolling: Max assist         General bed mobility comments: total A to move pt toward EOB before she began yelling, "no"    Transfers                   General transfer comment: refusal         ADL either performed or assessed with clinical judgement   ADL       Grooming: Wash/dry hands;Wash/dry face;Supervision/safety;Set up;Bed level                                       Vision Patient Visual Report: No change from baseline            Cognition Arousal/Alertness: Awake/alert Behavior During Therapy: WFL for tasks assessed/performed, Anxious Overall Cognitive Status: History of cognitive impairments - at baseline                                 General Comments: Pt is more alert this session and oriented to self only.                   Pertinent Vitals/ Pain       Pain Assessment Pain Assessment: Faces Faces Pain Scale: Hurts a little bit Pain Location: with mobility attempt Pain Descriptors / Indicators: Grimacing Pain Intervention(s): Limited activity within patient's tolerance, Repositioned, Monitored during session         Frequency  Min 2X/week (trial  2-3 sessions)        Progress Toward Goals  OT Goals(current goals can now be found in the care plan section)  Progress towards OT goals: OT to reassess next treatment  Acute Rehab OT Goals Patient Stated Goal: to get EOB OT Goal Formulation: With patient Time For Goal Achievement: 08/16/21 Potential to Achieve Goals: Good  Plan Discharge plan remains appropriate;Frequency remains appropriate       AM-PAC OT "6 Clicks" Daily Activity     Outcome Measure   Help from another person eating meals?: A Lot Help from another person taking care of personal grooming?: A Lot Help from another person toileting, which includes using toliet, bedpan, or urinal?: Total Help from another person bathing (including washing,  rinsing, drying)?: Total Help from another person to put on and taking off regular upper body clothing?: Total Help from another person to put on and taking off regular lower body clothing?: Total 6 Click Score: 8    End of Session    OT Visit Diagnosis: Muscle weakness (generalized) (M62.81)   Activity Tolerance Other (comment) (pt is self limiting this session)   Patient Left in bed;with call bell/phone within reach;with bed alarm set   Nurse Communication Mobility status        Time: 1500-1510 OT Time Calculation (min): 10 min  Charges: OT General Charges $OT Visit: 1 Visit OT Treatments $Therapeutic Activity: 8-22 mins  Darleen Crocker, MS, OTR/L , CBIS ascom [phone]  08/08/21, 3:38 PM

## 2021-08-08 NOTE — Care Management Important Message (Signed)
Important Message  Patient Details  Name: Kaitlin Wagner MRN: [id number] Date of Birth: Nov 19, 1945   Medicare Important Message Given:  Yes     Dannette Barbara 08/08/2021, 10:49 AM

## 2021-08-08 NOTE — Progress Notes (Signed)
[facility]  ROUNDING NOTE   Subjective:   Patient sitting up in bed Breakfast tray at bedside, untouched, but patient requires assistance Denies shortness of breath Reports improvement with appetite  Creatinine 6.6 Urine output of 1.5L in past 24 hours Sodium 145 IVF 1/2 NS at 47ml/hr   Objective:  Vital signs in last 24 hours:  Temp:  [97.5 F (36.4 C)-98 F (36.7 C)] 98 F (36.7 C) (02/08 0840) Pulse Rate:  [73-79] 79 (02/08 0840) Resp:  [16] 16 (02/08 0454) BP: (133-151)/(59-88) 139/88 (02/08 0840) SpO2:  [93 %-99 %] 98 % (02/08 0840) Weight:  [42.2 kg] 42.2 kg (02/08 0447)  Weight change: -0.4 kg Filed Weights   08/06/21 0207 08/07/21 0453 08/08/21 0447  Weight: 42.2 kg 42.6 kg 42.2 kg    Intake/Output: I/O last 3 completed shifts: In: 0  Out: 2600 [Urine:2600]   Intake/Output this shift:  No intake/output data recorded.  Physical Exam: General: NAD  Head: Normocephalic, atraumatic. Moist oral mucosal membranes  Eyes: Anicteric  Lungs:  Clear to auscultation, normal effort  Heart: Regular rate and rhythm  Abdomen:  Soft, nontender  Extremities:  no peripheral edema.  Neurologic: Nonfocal, moving all four extremities  Skin: No lesions       Basic Metabolic Panel: Recent Labs  Lab 08/03/21 0610 08/04/21 0729 08/05/21 0524 08/07/21 0554 08/08/21 0415  NA 143 144 148* 146* 145  K 3.1* 3.3* 3.5 4.3 4.4  CL 98 100 103 104 104  CO2 29 32 32 31 29  GLUCOSE 176* 113* 94 81 70  BUN 189* 173* 132* 115* 98*  CREATININE 11.67* 10.78* 9.66* 7.49* 6.62*  CALCIUM 7.6* 7.9* 8.3* 8.5* 8.4*  MG 2.7*  --   --   --   --   PHOS 7.6* 5.8*  --  5.3* 5.4*     Liver Function Tests: Recent Labs  Lab 08/03/21 0610 08/04/21 0729 08/07/21 0554 08/08/21 0415  AST 21  --   --   --   ALT 23  --   --   --   ALKPHOS 93  --   --   --   BILITOT 0.8  --   --   --   PROT 6.0*  --   --   --   ALBUMIN 2.8* 2.7* 3.0* 2.8*    No results for  input(s): LIPASE, AMYLASE in the last 168 hours. No results for input(s): AMMONIA in the last 168 hours.  CBC: Recent Labs  Lab 08/03/21 0610 08/04/21 0729 08/07/21 0554 08/08/21 0415  WBC 12.0* 16.0* 13.7* 15.5*  HGB 10.3* 10.1* 12.1 11.7*  HCT 29.5* 29.5* 38.4 37.1  MCV 83.8 87.8 94.6 96.6  PLT 124* 116* 150 151     Cardiac Enzymes: No results for input(s): CKTOTAL, CKMB, CKMBINDEX, TROPONINI in the last 168 hours.  BNP: Invalid input(s): POCBNP  CBG: No results for input(s): GLUCAP in the last 168 hours.   Microbiology: Results for orders placed or performed during the hospital encounter of 07/31/21  Resp Panel by RT-PCR (Flu A&B, Covid) Nasopharyngeal Swab     Status: None   Collection Time: 07/31/21  3:01 PM   Specimen: Nasopharyngeal Swab; Nasopharyngeal(NP) swabs in vial transport medium  Result Value Ref Range Status   SARS Coronavirus 2 by RT PCR NEGATIVE NEGATIVE Final    Comment: (NOTE) SARS-CoV-2 target nucleic acids are NOT DETECTED.  The SARS-CoV-2 RNA is generally detectable in upper respiratory specimens during the acute phase  of infection. The lowest concentration of SARS-CoV-2 viral copies this assay can detect is 138 copies/mL. A negative result does not preclude SARS-Cov-2 infection and should not be used as the sole basis for treatment or other patient management decisions. A negative result may occur with  improper specimen collection/handling, submission of specimen other than nasopharyngeal swab, presence of viral mutation(s) within the areas targeted by this assay, and inadequate number of viral copies(<138 copies/mL). A negative result must be combined with clinical observations, patient history, and epidemiological information. The expected result is Negative.  Fact Sheet for Patients:  EntrepreneurPulse.com.au  Fact Sheet for Healthcare Providers:  IncredibleEmployment.be  This test is no t yet  approved or cleared by the [place] FDA and  has been authorized for detection and/or diagnosis of SARS-CoV-2 by FDA under an Emergency Use Authorization (EUA). This EUA will remain  in effect (meaning this test can be used) for the duration of the COVID-19 declaration under Section 564(b)(1) of the Act, 21 U.S.C.section 360bbb-3(b)(1), unless the authorization is terminated  or revoked sooner.       Influenza A by PCR NEGATIVE NEGATIVE Final   Influenza B by PCR NEGATIVE NEGATIVE Final    Comment: (NOTE) The Xpert Xpress SARS-CoV-2/FLU/RSV plus assay is intended as an aid in the diagnosis of influenza from Nasopharyngeal swab specimens and should not be used as a sole basis for treatment. Nasal washings and aspirates are unacceptable for Xpert Xpress SARS-CoV-2/FLU/RSV testing.  Fact Sheet for Patients: EntrepreneurPulse.com.au  Fact Sheet for Healthcare Providers: IncredibleEmployment.be  This test is not yet approved or cleared by the [place] FDA and has been authorized for detection and/or diagnosis of SARS-CoV-2 by FDA under an Emergency Use Authorization (EUA). This EUA will remain in effect (meaning this test can be used) for the duration of the COVID-19 declaration under Section 564(b)(1) of the Act, 21 U.S.C. section 360bbb-3(b)(1), unless the authorization is terminated or revoked.  Performed at [facility], [address] [place], [place] [address]     Coagulation Studies: No results for input(s): LABPROT, INR in the last 72 hours.   Urinalysis: No results for input(s): COLORURINE, LABSPEC, PHURINE, GLUCOSEU, HGBUR, BILIRUBINUR, KETONESUR, PROTEINUR, UROBILINOGEN, NITRITE, LEUKOCYTESUR in the last 72 hours.  Invalid input(s): APPERANCEUR     Imaging: No results found.   Medications:    sodium chloride 75 mL/hr at 08/08/21 0447    Chlorhexidine Gluconate Cloth  6 each Topical Daily   feeding  supplement (NEPRO CARB STEADY)  237 mL Oral TID WC   heparin injection (subcutaneous)  5,000 Units Subcutaneous Q8H   mirtazapine  7.5 mg Oral QHS   multivitamin  1 tablet Oral QHS   pantoprazole  20 mg Oral Daily   acetaminophen **OR** acetaminophen  Assessment/ Plan:  [name] is a 76 y.o.  female  with a past medical history of GERD, hypertension, diabetes, and hyperlipidemia, who was admitted to [facility] on 07/31/2021 for Acute renal failure (ARF) ([place]) [N17.9] AKI (acute kidney injury) ([place]) [N17.9]   Acute Kidney Injury on chronic kidney disease stage IIIb with baseline creatinine 1.5 and GFR of 33 on 014/26/22.  Acute kidney injury secondary to dehydration, patient appears hypovolemic Chronic kidney disease risk factors include diabetes and hypertension. Renal ultrasound negative for obstruction No IV contrast exposure.  No indication for dialysis at this time.   -Renal function continues to improve with adequate urine output recorded.  -Continue IVF  -Avoid nephrotoxic agents and therapies. Avoid foley  catheter placement if purwick can be used efficiently -Remeron 7.5mg  po daily as appetite stimulant. Encourage oral intake  Lab Results  Component Value Date   CREATININE 6.62 (H) 08/08/2021   CREATININE 7.49 (H) 08/07/2021   CREATININE 9.66 (H) 08/05/2021    Intake/Output Summary (Last 24 hours) at 08/08/2021 1200 Last data filed at 08/08/2021 1052 Gross per 24 hour  Intake 0 ml  Output 1150 ml  Net -1150 ml    2. Metabolic acidosis.  Resolved with sodium bicarb infusion   3.  Anemia of chronic kidney disease Stable  4. 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  Glucose stable  5.  Hypernatremia likely due to acute kidney injury.  Continue 1/2 normal saline at 75 mL/h. Sodium 145    LOS: 8   2/8/202312:00 PM

## 2021-08-08 NOTE — Progress Notes (Signed)
Pt refusing NG tube. Attempted to put in NG tube however when this nurse tried to get closed to her she pushes away and nods  her head. Pt is not able to follow commands due to advanced dementia. MD notified.

## 2021-08-08 NOTE — Care Management (Signed)
I discussed this patient's case at length with the her [facility] PCP Dr. Wilford Grist.  We discussed that the patient 2 weeks prior to presentation to the hospital had been communicative, eating on her own, ambulating and limited capacity with assistive devices.  Quite a significant decline.  We discussed with the patient p.o. intake has been quite poor.  Already has recommendation for Dobbhoff tube placement and initiation of tube feeds.  I agree that Dobbhoff tube and initiation of tube feeds may be worthwhile.  However informed by bedside RN that patient had refused NG tube placement.  She pushes nurses hand away.  We will revisit this with patient and family in a.m.  At this time we will continue IV fluids and appetite stimulation.  Ralene Muskrat MD

## 2021-08-08 NOTE — Progress Notes (Signed)
PROGRESS NOTE    Kaitlin Wagner  YSA:[id number] DOB: 11/18/1945 DOA: 07/31/2021 PCP: Marnee Guarneri, MD    Brief Narrative:  76 year old woman with dementia presented with history of being less responsive, weaker with minimal appetite. Found to have creatinine of 13 from baseline of 1.6.  Admitted for acute renal failure. --Seen by nephrology, slowly improving with conservative management, on IV fluid.  Still with very poor p.o. intake.   Assessment & Plan:   Principal Problem:   Acute renal failure (ARF) (HCC) Active Problems:   Advanced dementia   GERD (gastroesophageal reflux disease)   Pressure injury of right buttock, stage 2 (HCC)   Protein-calorie malnutrition, severe   Uremia   Thrombocytopenia (HCC)   Aortic atherosclerosis (HCC)  Improving non oliguric acute renal failure (ARF) ([place])- (present on admission) - Slowly improving.  Renal ultrasound was unremarkable.  Continue management as per nephrology. Creatinine continues to downtrend 6.55 from 13 Plan: Continue IV fluid 1/2 NS at 75cc/hr Continue to closely monitor urine output strict I's and O's Foley catheter removed on 08/07/2021.  External Foley catheter in place.     Mild hypervolemic hypernatremia Serum sodium 145 Continue half-normal saline,  Daily renal function closely monitor serum sodium while on IV fluid Repeat renal function in the morning.   Hyperphosphatemia in the setting of AKI Serum phosphorus 5.3 May benefit from binders, defer to nephrology.   Leukocytosis, suspect reactive Nonseptic appearing White count is down trending Afebrile.   Resolved thrombocytopenia (HCC) Suspect in the setting of acute illness   Uremia -- Secondary to acute renal failure, slowly improving BUN downtrending from greater than 200. Continue to monitor   Distended abdomen CT showed distended bowel loops. No vomiting or pain to suggest symptomatic ileus, but I would expect one with a BUN >200.   - Continue IV  fluids We will continue to closely monitor   Protein-calorie malnutrition, severe Continue to encourage increase in oral protein calorie intake. Can consider NGT placement and temporary tube feeds We will discuss with outpatient PCP   Pressure injury of right buttock, stage 2 ([place])- (present on admission) - WOC Continue local wound care   DNR (do not resuscitate)/DNI(Do Not Intubate) Verified DNR/DNI status with dtr [name].   Depression -Hold Celexa Started on Remeron on 08/07/21 Per psychiatry to stimulate appetite   Primary hypertension BP normal off meds   GERD (gastroesophageal reflux disease)- (present on admission) -Continue PPI   Advanced dementia- (present on admission) --At baseline, family report she is oriented to self and family, aware of home surroundings, can converse with them appropriately. -- Suspect very close to baseline   Acute metabolic encephalopathy-resolved as of 08/05/2021 See above baseline.       DVT prophylaxis: SQ heparin Code Status: DNR Family Communication:[facility] PCP Dr. Wilford Grist [phone] Disposition Plan: Status is: Inpatient  Remains inpatient appropriate because: Severe renal failure on IV fluids.  Severe protein calorie malnutrition.   Level of care: Med-Surg  Consultants:  Nephrology  Procedures:  None  Antimicrobials:    Subjective: Seen and examined.  Resting in bed.  Frail, weak voice  Objective: Vitals:   08/08/21 0447 08/08/21 0454 08/08/21 0840 08/08/21 1452  BP:  138/78 139/88 135/64  Pulse:  73 79 81  Resp:  16    Temp:  (!) 97.5 F (36.4 C) 98 F (36.7 C) 98.3 F (36.8 C)  TempSrc:  Axillary  Oral  SpO2:  99% 98% 96%  Weight: 42.2 kg  Height:        Intake/Output Summary (Last 24 hours) at 08/08/2021 1537 Last data filed at 08/08/2021 1500 Gross per 24 hour  Intake 120 ml  Output 1200 ml  Net -1080 ml   Filed Weights   08/06/21 0207 08/07/21 0453 08/08/21 0447  Weight: 42.2 kg 42.6 kg  42.2 kg    Examination:  General exam: NAD.  Appears frail Respiratory system: Clear to auscultation. Respiratory effort normal. Cardiovascular system: S1S2 heard. No murmurs. No pedal edema. Gastrointestinal system: Abdomen is nondistended, soft and nontender. No organomegaly or masses felt. Normal bowel sounds heard. Central nervous system: Alert and oriented. No focal neurological deficits. Extremities: Symmetric 5 x 5 power. Skin: No rashes, lesions or ulcers Psychiatry: Judgement and insight appear normal. Mood & affect appropriate.     Data Reviewed: I have personally reviewed following labs and imaging studies  CBC: Recent Labs  Lab 08/03/21 0610 08/04/21 0729 08/07/21 0554 08/08/21 0415  WBC 12.0* 16.0* 13.7* 15.5*  HGB 10.3* 10.1* 12.1 11.7*  HCT 29.5* 29.5* 38.4 37.1  MCV 83.8 87.8 94.6 96.6  PLT 124* 116* 150 409   Basic Metabolic Panel: Recent Labs  Lab 08/03/21 0610 08/04/21 0729 08/05/21 0524 08/07/21 0554 08/08/21 0415  NA 143 144 148* 146* 145  K 3.1* 3.3* 3.5 4.3 4.4  CL 98 100 103 104 104  CO2 29 32 32 31 29  GLUCOSE 176* 113* 94 81 70  BUN 189* 173* 132* 115* 98*  CREATININE 11.67* 10.78* 9.66* 7.49* 6.62*  CALCIUM 7.6* 7.9* 8.3* 8.5* 8.4*  MG 2.7*  --   --   --   --   PHOS 7.6* 5.8*  --  5.3* 5.4*   GFR: Estimated Creatinine Clearance: 4.8 mL/min (A) (by C-G formula based on SCr of 6.62 mg/dL (H)). Liver Function Tests: Recent Labs  Lab 08/03/21 0610 08/04/21 0729 08/07/21 0554 08/08/21 0415  AST 21  --   --   --   ALT 23  --   --   --   ALKPHOS 93  --   --   --   BILITOT 0.8  --   --   --   PROT 6.0*  --   --   --   ALBUMIN 2.8* 2.7* 3.0* 2.8*   No results for input(s): LIPASE, AMYLASE in the last 168 hours. No results for input(s): AMMONIA in the last 168 hours. Coagulation Profile: Recent Labs  Lab 08/03/21 0610  INR 1.2   Cardiac Enzymes: No results for input(s): CKTOTAL, CKMB, CKMBINDEX, TROPONINI in the last 168  hours. BNP (last 3 results) No results for input(s): PROBNP in the last 8760 hours. HbA1C: No results for input(s): HGBA1C in the last 72 hours. CBG: No results for input(s): GLUCAP in the last 168 hours. Lipid Profile: No results for input(s): CHOL, HDL, LDLCALC, TRIG, CHOLHDL, LDLDIRECT in the last 72 hours. Thyroid Function Tests: No results for input(s): TSH, T4TOTAL, FREET4, T3FREE, THYROIDAB in the last 72 hours. Anemia Panel: No results for input(s): VITAMINB12, FOLATE, FERRITIN, TIBC, IRON, RETICCTPCT in the last 72 hours. Sepsis Labs: No results for input(s): PROCALCITON, LATICACIDVEN in the last 168 hours.  Recent Results (from the past 240 hour(s))  Resp Panel by RT-PCR (Flu A&B, Covid) Nasopharyngeal Swab     Status: None   Collection Time: 07/31/21  3:01 PM   Specimen: Nasopharyngeal Swab; Nasopharyngeal(NP) swabs in vial transport medium  Result Value Ref Range Status   SARS Coronavirus 2  by RT PCR NEGATIVE NEGATIVE Final    Comment: (NOTE) SARS-CoV-2 target nucleic acids are NOT DETECTED.  The SARS-CoV-2 RNA is generally detectable in upper respiratory specimens during the acute phase of infection. The lowest concentration of SARS-CoV-2 viral copies this assay can detect is 138 copies/mL. A negative result does not preclude SARS-Cov-2 infection and should not be used as the sole basis for treatment or other patient management decisions. A negative result may occur with  improper specimen collection/handling, submission of specimen other than nasopharyngeal swab, presence of viral mutation(s) within the areas targeted by this assay, and inadequate number of viral copies(<138 copies/mL). A negative result must be combined with clinical observations, patient history, and epidemiological information. The expected result is Negative.  Fact Sheet for Patients:  EntrepreneurPulse.com.au  Fact Sheet for Healthcare Providers:   IncredibleEmployment.be  This test is no t yet approved or cleared by the [place] FDA and  has been authorized for detection and/or diagnosis of SARS-CoV-2 by FDA under an Emergency Use Authorization (EUA). This EUA will remain  in effect (meaning this test can be used) for the duration of the COVID-19 declaration under Section 564(b)(1) of the Act, 21 U.S.C.section 360bbb-3(b)(1), unless the authorization is terminated  or revoked sooner.       Influenza A by PCR NEGATIVE NEGATIVE Final   Influenza B by PCR NEGATIVE NEGATIVE Final    Comment: (NOTE) The Xpert Xpress SARS-CoV-2/FLU/RSV plus assay is intended as an aid in the diagnosis of influenza from Nasopharyngeal swab specimens and should not be used as a sole basis for treatment. Nasal washings and aspirates are unacceptable for Xpert Xpress SARS-CoV-2/FLU/RSV testing.  Fact Sheet for Patients: EntrepreneurPulse.com.au  Fact Sheet for Healthcare Providers: IncredibleEmployment.be  This test is not yet approved or cleared by the [place] FDA and has been authorized for detection and/or diagnosis of SARS-CoV-2 by FDA under an Emergency Use Authorization (EUA). This EUA will remain in effect (meaning this test can be used) for the duration of the COVID-19 declaration under Section 564(b)(1) of the Act, 21 U.S.C. section 360bbb-3(b)(1), unless the authorization is terminated or revoked.  Performed at [facility], [address], [place], [place] [address]          Radiology Studies: No results found.      Scheduled Meds:  Chlorhexidine Gluconate Cloth  6 each Topical Daily   feeding supplement (NEPRO CARB STEADY)  237 mL Oral TID WC   heparin injection (subcutaneous)  5,000 Units Subcutaneous Q8H   mirtazapine  7.5 mg Oral QHS   multivitamin  1 tablet Oral QHS   pantoprazole  20 mg Oral Daily   Continuous Infusions:  sodium  chloride 75 mL/hr at 08/08/21 0447     LOS: 8 days       Sidney Ace, MD [facility]   If 7PM-7AM, please contact night-coverage  08/08/2021, 3:37 PM

## 2021-08-08 NOTE — Evaluation (Signed)
Clinical/Bedside Swallow Evaluation Patient Details  Name: Kaitlin Wagner MRN: [id number] Date of Birth: 1945/12/17  Today's Date: 08/08/2021 Time: SLP Start Time (ACUTE ONLY): 1655 SLP Stop Time (ACUTE ONLY): 1740 SLP Time Calculation (min) (ACUTE ONLY): 45 min  Past Medical History:  Past Medical History:  Diagnosis Date   Aortic atherosclerosis ([place]) 08/05/2021   Cancer ([place]) colon   Diabetes mellitus without complication ([place])    GERD (gastroesophageal reflux disease)    Hyperlipidemia    Hypertension    Past Surgical History:  Past Surgical History:  Procedure Laterality Date   CHOLECYSTECTOMY     colon resection     laparoscopic   COLON SURGERY     COLONOSCOPY WITH PROPOFOL N/A 05/12/2015   Procedure: COLONOSCOPY WITH PROPOFOL;  Surgeon: Hulen Luster, MD;  Location: [facility] ENDOSCOPY;  Service: Gastroenterology;  Laterality: N/A;   COLONOSCOPY WITH PROPOFOL N/A 05/12/2018   Procedure: COLONOSCOPY WITH PROPOFOL;  Surgeon: Toledo, Benay Pike, MD;  Location: [facility] ENDOSCOPY;  Service: Gastroenterology;  Laterality: N/A;   ESOPHAGOGASTRODUODENOSCOPY N/A 05/12/2015   Procedure: ESOPHAGOGASTRODUODENOSCOPY (EGD);  Surgeon: Hulen Luster, MD;  Location: [facility] ENDOSCOPY;  Service: Gastroenterology;  Laterality: N/A;   HPI:  Pt is a 76 y.o. F  who was sent to ER by PCP for renal failure. Recent SNF stay, and per family pt with functional decline. PMH of Advanced Dementia, HTN, and CKD IIIb, sacral wound, HLD, DM2, TIA.  Pt has exhibited a decline in status in recent weeks w/ reduced interest in oral intake; poor po intake in recent days. She refuses most/all po's offered except for few bites/sips intermittently. She also is less engaged in therapies w/ minimal progress.  She is followed by the [facility] at home.  OF NOTE: Pt was seen by ST services during an admit 09/2020 w/ dxs including Advanced Dementia, weight lost, Barretts Esophagus, sacral decubitus ulcer, altered mentation, small bowel issues  then.    Assessment / Plan / Recommendation  Clinical Impression  Pt appears to present w/ oral phase dysphagia in light of declined Cognitive status -- dx of Baseline Advanced Dementia. W/ any decline in medical status, illness, or change in  venue/environment, Cognitive decline can be exacerbated and can impact overall awareness/timing of swallow and safety during po tasks which increases risk for aspiration, choking. Desire for oral intake can be impacted as well -- per chart notes, this has been ongoing for several weeks. Pt's risk for aspiration is present but can be reduced when following general aspiration precautions, given feeding support and encouragement, and when using a modified diet consistency of Puree foods at this time. She required MOD verbal/visual/tactile cues to take and follow through w/ po intake, and MAX support for positioning Upright and feeding. Pt often answered basic y/n questions and was able to state she liked "grits"; following commands was poor and she withdrew when attempting to position UEs(bilat).            Pt consumed few trials of purees and thin liquids via straw w/ NO overt clinical s/s of aspiration noted; No immediate, overt coughing, no decline in vocal quality, no decline in respiratory status during/post trials. Oral phase was c/b deficits of reduced awareness for oral prep/acceptance of po trials; poor mastication of softened solids in puree w/ ineffective mastication/gumming; munching pattern; decreased bolus manipulation w/ slow lingual/oral movements, slow A-P transfer, poor awareness for single swallows during straw use -- overfilled mouth and required straw Pinching and/or stopping to allow  pt to swallow/clear mouth. Pt did NOT attempt self-feeding; required MAX support and assistance w/ feeding. OM Exam was cursory but appeared [facility] w/ No overt unilateral weakness noted. Confusion of OM tasks and oral care w/ swabs -- biting on tongue blade and pulling away  from oral care -- pt appeared uncomfortable given oral care(faces, withdrawal).  D/t pt's declined Cognitive presentation w/ Advanced Dementia and her risk for aspiration/choking, recommend continue a Dysphagia level 1(pureed foods moistened) w/ thin liquids; general aspiration precautions including Pinching straw to reduce impulsive drinking behaviors; reduce Distractions during meals and engage pt during po's at meal for self-feeding if she will. Encourage oral intake but do Not force oral intake. Offer options, including her favorites -- pt stated she liked "Grits w/ salt/pepper", so these were ordered for the breakfast meals. Pills Crushed in Puree for safer swallowing. Support w/ feeding at meals -- sit pt Upright and midline for oral intake. MD/NSG updated. No skilled ST services indicated at this time d/t severity of pt's Cognitive decline and lack of desire for oral intake.  Precautions posted.  SLP Visit Diagnosis: Dysphagia, oral phase (R13.11) (Advanced Dementia baseline)    Aspiration Risk  Mild aspiration risk;Risk for inadequate nutrition/hydration (reduced following precautions during feeding)    Diet Recommendation   Dysphagia level 1(pureed foods moistened) w/ thin liquids; general aspiration precautions including Pinching straw to reduce impulsive drinking behaviors; reduce Distractions during meals and engage pt during po's at meal for self-feeding if she will. Encourage oral intake but do Not force oral intake. Offer options, including her favorites -- pt stated she liked "Grits w/ salt/pepper", so these were ordered for the breakfast meals. Support w/ feeding at meals -- sit pt Upright and midline for oral intake.  Medication Administration: Crushed with puree    Other  Recommendations Recommended Consults:  (Palliative Care f/u for [place] discussion; Dietician f/u for support) Oral Care Recommendations: Oral care BID;Oral care before and after PO;Staff/trained caregiver to provide  oral care Other Recommendations:  (n/a)    Recommendations for follow up therapy are one component of a multi-disciplinary discharge planning process, led by the attending physician.  Recommendations may be updated based on patient status, additional functional criteria and insurance authorization.  Follow up Recommendations Follow physician's recommendations for discharge plan and follow up therapies      Assistance Recommended at Discharge Frequent or constant Supervision/Assistance  Functional Status Assessment Patient has had a recent decline in their functional status and/or demonstrates limited ability to make significant improvements in function in a reasonable and predictable amount of time  Frequency and Duration  (n/a)   (n/a)       Prognosis Prognosis for Safe Diet Advancement: Fair Barriers to Reach Goals: Cognitive deficits;Language deficits;Time post onset;Severity of deficits;Motivation;Behavior Barriers/Prognosis Comment: advanced Dementia      Swallow Study   General Date of Onset: 07/31/21 HPI: Pt is a 76 y.o. F  who was sent to ER by PCP for renal failure. Recent SNF stay, and per family pt with functional decline. PMH of Advanced Dementia, HTN, and CKD IIIb, sacral wound, HLD, DM2, TIA.  Pt has exhibited a decline in status in recent weeks w/ reduced interest in oral intake; poor po intake in recent days. She refuses most/all po's offered except for few bites/sips intermittently. She also is less engaged in therapies w/ minimal progress.  She is followed by the [facility] at home.  OF NOTE: Pt was seen by ST services during an admit  09/2020 w/ dxs including Advanced Dementia, weight lost, Barretts Esophagus, sacral decubitus ulcer, altered mentation, small bowel issues then. Type of Study: Bedside Swallow Evaluation Previous Swallow Assessment: 09/2020 Diet Prior to this Study: Dysphagia 3 (soft);Thin liquids Temperature Spikes Noted: No Respiratory Status: Room  air History of Recent Intubation: No Behavior/Cognition: Alert;Cooperative;Pleasant mood;Confused;Distractible;Requires cueing (required encouragement to take the few po trials) Oral Cavity Assessment: Excessive secretions (slight -- saliva) Oral Care Completed by SLP: Yes (pt appeared to not like c/b pulling away) Oral Cavity - Dentition: Poor condition;Missing dentition (many missing) Vision:  (n/a) Self-Feeding Abilities: Total assist (did not attempt to self-feed) Patient Positioning: Upright in bed (needed full positioning support/encouragement - c/o discomfort in back during movement) Baseline Vocal Quality: Normal;Low vocal intensity (adequate when she spoke) Volitional Cough: Cognitively unable to elicit Volitional Swallow: Unable to elicit    Oral/Motor/Sensory Function Overall Oral Motor/Sensory Function: Within functional limits (grossly == lingual/labial movements during bolus management appeared functional w/ NO unilateral weakness noted)   Ice Chips Ice chips: Not tested   Thin Liquid Thin Liquid: Within functional limits Presentation: Straw (6 trials accepted) Other Comments: tended to take long draws on the straw over-filling her mouth vs transfer/swallow w/ each suck; needed to [facility] straw and remove for pt to swallow and clear mouth    Nectar Thick Nectar Thick Liquid: Not tested   Honey Thick Honey Thick Liquid: Not tested   Puree Puree: Within functional limits (adequate) Presentation: Spoon (fed; 4 trials given/encouraged) Other Comments: min extra oral manipulation but grossly adequate; oral clearing b/t trials   Solid     Solid: Impaired Presentation: Spoon (fed; 1 trial) Oral Phase Impairments: Impaired mastication;Poor awareness of bolus;Reduced lingual movement/coordination Oral Phase Functional Implications: Impaired mastication;Prolonged oral transit Pharyngeal Phase Impairments:  (none) Other Comments: not functional        Orinda Kenner, MS,  CCC-SLP Speech Language Pathologist Rehab Services; [place] [phone] (ascom) Elmon Shader 08/08/2021,6:08 PM

## 2021-08-09 DIAGNOSIS — N179 Acute kidney failure, unspecified: Secondary | ICD-10-CM | POA: Diagnosis not present

## 2021-08-09 LAB — MAGNESIUM: Magnesium: 1.8 mg/dL (ref 1.7–2.4)

## 2021-08-09 LAB — CBC WITH DIFFERENTIAL/PLATELET
Abs Immature Granulocytes: 0.08 10*3/uL — ABNORMAL HIGH (ref 0.00–0.07)
Basophils Absolute: 0 10*3/uL (ref 0.0–0.1)
Basophils Relative: 0 %
Eosinophils Absolute: 0.6 10*3/uL — ABNORMAL HIGH (ref 0.0–0.5)
Eosinophils Relative: 4 %
HCT: 34.7 % — ABNORMAL LOW (ref 36.0–46.0)
Hemoglobin: 10.9 g/dL — ABNORMAL LOW (ref 12.0–15.0)
Immature Granulocytes: 1 %
Lymphocytes Relative: 11 %
Lymphs Abs: 1.7 10*3/uL (ref 0.7–4.0)
MCH: 29.6 pg (ref 26.0–34.0)
MCHC: 31.4 g/dL (ref 30.0–36.0)
MCV: 94.3 fL (ref 80.0–100.0)
Monocytes Absolute: 0.5 10*3/uL (ref 0.1–1.0)
Monocytes Relative: 4 %
Neutro Abs: 12.2 10*3/uL — ABNORMAL HIGH (ref 1.7–7.7)
Neutrophils Relative %: 80 %
Platelets: 149 10*3/uL — ABNORMAL LOW (ref 150–400)
RBC: 3.68 MIL/uL — ABNORMAL LOW (ref 3.87–5.11)
RDW: 12.9 % (ref 11.5–15.5)
WBC: 15.1 10*3/uL — ABNORMAL HIGH (ref 4.0–10.5)
nRBC: 0 % (ref 0.0–0.2)

## 2021-08-09 LAB — BASIC METABOLIC PANEL
Anion gap: 8 (ref 5–15)
BUN: 96 mg/dL — ABNORMAL HIGH (ref 8–23)
CO2: 32 mmol/L (ref 22–32)
Calcium: 8.3 mg/dL — ABNORMAL LOW (ref 8.9–10.3)
Chloride: 106 mmol/L (ref 98–111)
Creatinine, Ser: 5.95 mg/dL — ABNORMAL HIGH (ref 0.44–1.00)
GFR, Estimated: 7 mL/min — ABNORMAL LOW (ref >60)
Glucose, Bld: 83 mg/dL (ref 70–99)
Potassium: 3.6 mmol/L (ref 3.5–5.1)
Sodium: 146 mmol/L — ABNORMAL HIGH (ref 135–145)

## 2021-08-09 LAB — PHOSPHORUS: Phosphorus: 4.7 mg/dL — ABNORMAL HIGH (ref 2.5–4.6)

## 2021-08-09 MED ORDER — DEXAMETHASONE 4 MG PO TABS
4.0000 mg | ORAL_TABLET | Freq: Every day | ORAL | Status: DC
  Administered 2021-08-09 – 2021-08-16 (×7): 4 mg via ORAL
  Filled 2021-08-09 (×8): qty 1

## 2021-08-09 NOTE — Progress Notes (Signed)
Palliative:    Chart review completed.  Kaitlin Wagner is active with the [facility].  [facility] works very closely with patients and families for goals of care.  Kaitlin Wagner goes to their facility 5 days a week and has supervision from her family the rest of the time which equates to 24/7.   Transition of care team is working closely for disposition.   Conference with attending, nephrology, bedside nursing staff, transition of care related to patient condition, needs, goals of care, disposition.   Plan:   Kaitlin Wagner is active with the [facility] program.  At this point, she is treat the treatable but no CPR or intubation.  Transition of care team is working closely with [facility] for discharge needs.     No charge   Quinn Axe, NP Palliative medicine team Team phone [phone] Greater than 50% of this time was spent counseling and coordinating care related to the above assessment and plan.

## 2021-08-09 NOTE — Progress Notes (Signed)
Physical Therapy Treatment Patient Details Name: Kaitlin Wagner MRN: [id number] DOB: [birth year] Today's Date: 08/09/2021   History of Present Illness Pt is a 76 y.o. F  who was sent to ER by PCP for renal failure. Recent SNF stay, and per family pt with functional decline. PMH of advanced dementia, HTN, and CKD IIIb, sacral wound, HLD, DM2, TIA.    PT Comments    Pt initially agreed to PT session, however began resisting B LE ROM exercises in bed and later refused any mobility. Pt unable to clearly state reason. Therapist assisted with positioning and notified nursing of possible need to assist with feeding. Continue PT per POC.   Recommendations for follow up therapy are one component of a multi-disciplinary discharge planning process, led by the attending physician.  Recommendations may be updated based on patient status, additional functional criteria and insurance authorization.  Follow Up Recommendations  Skilled nursing-short term rehab (<3 hours/day)     Assistance Recommended at Discharge Frequent or constant Supervision/Assistance  Patient can return home with the following A lot of help with walking and/or transfers   Equipment Recommendations       Recommendations for Other Services       Precautions / Restrictions Precautions Precautions: Fall Restrictions Weight Bearing Restrictions: No     Mobility  Bed Mobility                    Transfers                        Ambulation/Gait                   Stairs             Wheelchair Mobility    Modified Rankin (Stroke Patients Only)       Balance                                            Cognition Arousal/Alertness: Awake/alert Behavior During Therapy: WFL for tasks assessed/performed, Anxious Overall Cognitive Status: History of cognitive impairments - at baseline                                          Exercises General  Exercises - Lower Extremity Ankle Circles/Pumps: AAROM, Both, 10 reps Heel Slides: AAROM, Both, 5 reps Hip ABduction/ADduction: AAROM, Both, 5 reps Other Exercises Other Exercises: Pt very resistant to ROM R>L    General Comments General comments (skin integrity, edema, etc.): Pt educated on importance of mobility and strengthening exercises, however pt refused      Pertinent Vitals/Pain Pain Assessment Pain Assessment: Faces Faces Pain Scale: Hurts little more Pain Location: with mobility attempt Pain Descriptors / Indicators: Grimacing Pain Intervention(s): Limited activity within patient's tolerance    Home Living                          Prior Function            PT Goals (current goals can now be found in the care plan section)      Frequency    Min 2X/week      PT Plan Current plan remains appropriate    Co-evaluation  AM-PAC PT "6 Clicks" Mobility   Outcome Measure  Help needed turning from your back to your side while in a flat bed without using bedrails?: Total Help needed moving from lying on your back to sitting on the side of a flat bed without using bedrails?: Total Help needed moving to and from a bed to a chair (including a wheelchair)?: Total Help needed standing up from a chair using your arms (e.g., wheelchair or bedside chair)?: Total Help needed to walk in hospital room?: Total Help needed climbing 3-5 steps with a railing? : Total 6 Click Score: 6    End of Session   Activity Tolerance: Patient limited by pain (cognitive impairments affecting participation level) Patient left: in bed;with call bell/phone within reach;with bed alarm set Nurse Communication:  (Pt refusing out of bed/EOB activity) PT Visit Diagnosis: Other abnormalities of gait and mobility (R26.89);Difficulty in walking, not elsewhere classified (R26.2);Muscle weakness (generalized) (M62.81)     Time: 4401-0272 PT Time Calculation (min)  (ACUTE ONLY): 9 min  Charges:  $Therapeutic Exercise: 8-22 mins                    Mikel Cella, PTA   Kaitlin Wagner 08/09/2021, 12:32 PM

## 2021-08-09 NOTE — Progress Notes (Addendum)
[facility]  ROUNDING NOTE   Subjective:   Patient resting quietly, alert No family at bedside Continues to exhibit poor appetite Denies nausea, vomiting, diarrhea, and shortness of breath  Creatinine 6 Urine output recorded 600 mL in past 24 hours   Objective:  Vital signs in last 24 hours:  Temp:  [98.2 F (36.8 C)-98.7 F (37.1 C)] 98.2 F (36.8 C) (02/09 0742) Pulse Rate:  [70-81] 70 (02/09 0742) Resp:  [16-18] 18 (02/09 0742) BP: (119-135)/(64-85) 119/67 (02/09 0742) SpO2:  [96 %-100 %] 100 % (02/09 0742) Weight:  [43.9 kg] 43.9 kg (02/09 0500)  Weight change: 1.7 kg Filed Weights   08/07/21 0453 08/08/21 0447 08/09/21 0500  Weight: 42.6 kg 42.2 kg 43.9 kg    Intake/Output: I/O last 3 completed shifts: In: 120 [P.O.:120] Out: 1050 [Urine:1050]   Intake/Output this shift:  No intake/output data recorded.  Physical Exam: General: NAD  Head: Normocephalic, atraumatic. Moist oral mucosal membranes  Eyes: Anicteric  Lungs:  Clear to auscultation, normal effort  Heart: Regular rate and rhythm  Abdomen:  Soft, nontender  Extremities:  no peripheral edema.  Neurologic: Nonfocal, moving all four extremities  Skin: No lesions       Basic Metabolic Panel: Recent Labs  Lab 08/03/21 0610 08/04/21 0729 08/05/21 0524 08/07/21 0554 08/08/21 0415 08/09/21 0417  NA 143 144 148* 146* 145 146*  K 3.1* 3.3* 3.5 4.3 4.4 3.6  CL 98 100 103 104 104 106  CO2 29 32 32 31 29 32  GLUCOSE 176* 113* 94 81 70 83  BUN 189* 173* 132* 115* 98* 96*  CREATININE 11.67* 10.78* 9.66* 7.49* 6.62* 5.95*  CALCIUM 7.6* 7.9* 8.3* 8.5* 8.4* 8.3*  MG 2.7*  --   --   --   --  1.8  PHOS 7.6* 5.8*  --  5.3* 5.4* 4.7*     Liver Function Tests: Recent Labs  Lab 08/03/21 0610 08/04/21 0729 08/07/21 0554 08/08/21 0415  AST 21  --   --   --   ALT 23  --   --   --   ALKPHOS 93  --   --   --   BILITOT 0.8  --   --   --   PROT 6.0*  --   --   --   ALBUMIN 2.8*  2.7* 3.0* 2.8*    No results for input(s): LIPASE, AMYLASE in the last 168 hours. No results for input(s): AMMONIA in the last 168 hours.  CBC: Recent Labs  Lab 08/03/21 0610 08/04/21 0729 08/07/21 0554 08/08/21 0415 08/09/21 0758  WBC 12.0* 16.0* 13.7* 15.5* 15.1*  NEUTROABS  --   --   --   --  12.2*  HGB 10.3* 10.1* 12.1 11.7* 10.9*  HCT 29.5* 29.5* 38.4 37.1 34.7*  MCV 83.8 87.8 94.6 96.6 94.3  PLT 124* 116* 150 151 149*     Cardiac Enzymes: No results for input(s): CKTOTAL, CKMB, CKMBINDEX, TROPONINI in the last 168 hours.  BNP: Invalid input(s): POCBNP  CBG: No results for input(s): GLUCAP in the last 168 hours.   Microbiology: Results for orders placed or performed during the hospital encounter of 07/31/21  Resp Panel by RT-PCR (Flu A&B, Covid) Nasopharyngeal Swab     Status: None   Collection Time: 07/31/21  3:01 PM   Specimen: Nasopharyngeal Swab; Nasopharyngeal(NP) swabs in vial transport medium  Result Value Ref Range Status   SARS Coronavirus 2 by RT PCR NEGATIVE NEGATIVE Final  Comment: (NOTE) SARS-CoV-2 target nucleic acids are NOT DETECTED.  The SARS-CoV-2 RNA is generally detectable in upper respiratory specimens during the acute phase of infection. The lowest concentration of SARS-CoV-2 viral copies this assay can detect is 138 copies/mL. A negative result does not preclude SARS-Cov-2 infection and should not be used as the sole basis for treatment or other patient management decisions. A negative result may occur with  improper specimen collection/handling, submission of specimen other than nasopharyngeal swab, presence of viral mutation(s) within the areas targeted by this assay, and inadequate number of viral copies(<138 copies/mL). A negative result must be combined with clinical observations, patient history, and epidemiological information. The expected result is Negative.  Fact Sheet for Patients:   EntrepreneurPulse.com.au  Fact Sheet for Healthcare Providers:  IncredibleEmployment.be  This test is no t yet approved or cleared by the [place] FDA and  has been authorized for detection and/or diagnosis of SARS-CoV-2 by FDA under an Emergency Use Authorization (EUA). This EUA will remain  in effect (meaning this test can be used) for the duration of the COVID-19 declaration under Section 564(b)(1) of the Act, 21 U.S.C.section 360bbb-3(b)(1), unless the authorization is terminated  or revoked sooner.       Influenza A by PCR NEGATIVE NEGATIVE Final   Influenza B by PCR NEGATIVE NEGATIVE Final    Comment: (NOTE) The Xpert Xpress SARS-CoV-2/FLU/RSV plus assay is intended as an aid in the diagnosis of influenza from Nasopharyngeal swab specimens and should not be used as a sole basis for treatment. Nasal washings and aspirates are unacceptable for Xpert Xpress SARS-CoV-2/FLU/RSV testing.  Fact Sheet for Patients: EntrepreneurPulse.com.au  Fact Sheet for Healthcare Providers: IncredibleEmployment.be  This test is not yet approved or cleared by the [place] FDA and has been authorized for detection and/or diagnosis of SARS-CoV-2 by FDA under an Emergency Use Authorization (EUA). This EUA will remain in effect (meaning this test can be used) for the duration of the COVID-19 declaration under Section 564(b)(1) of the Act, 21 U.S.C. section 360bbb-3(b)(1), unless the authorization is terminated or revoked.  Performed at [facility], [address] [place], [place] [address]     Coagulation Studies: No results for input(s): LABPROT, INR in the last 72 hours.   Urinalysis: No results for input(s): COLORURINE, LABSPEC, PHURINE, GLUCOSEU, HGBUR, BILIRUBINUR, KETONESUR, PROTEINUR, UROBILINOGEN, NITRITE, LEUKOCYTESUR in the last 72 hours.  Invalid input(s): APPERANCEUR      Imaging: No results found.   Medications:    sodium chloride 75 mL/hr at 08/08/21 0447   feeding supplement (OSMOLITE 1.5 CAL)      Chlorhexidine Gluconate Cloth  6 each Topical Daily   dexamethasone  4 mg Oral Daily   feeding supplement (PROSource TF)  45 mL Per Tube Daily   free water  30 mL Per Tube Q4H   heparin injection (subcutaneous)  5,000 Units Subcutaneous Q8H   mirtazapine  7.5 mg Oral QHS   multivitamin  1 tablet Oral QHS   nutrition supplement (JUVEN)  1 packet Per Tube BID BM   pantoprazole  20 mg Oral Daily   acetaminophen **OR** acetaminophen  Assessment/ Plan:  [name] is a 76 y.o.  female  with a past medical history of GERD, hypertension, diabetes, and hyperlipidemia, who was admitted to [facility] on 07/31/2021 for Acute renal failure (ARF) ([place]) [N17.9] AKI (acute kidney injury) ([place]) [N17.9]   Acute Kidney Injury on chronic kidney disease stage IIIb with baseline creatinine 1.5 and GFR of  33 on 014/26/22.  Acute kidney injury secondary to dehydration, patient appears hypovolemic Chronic kidney disease risk factors include diabetes and hypertension. Renal ultrasound negative for obstruction No IV contrast exposure.  No indication for dialysis at this time.   -Creatinine continues to decline with adequate urine output -Continue IV fluids at this time for hydration - Initially ordered Remeron as appetite stimulant unsuccessful.  Primary team to order Decadron. -Palliative care following  Lab Results  Component Value Date   CREATININE 5.95 (H) 08/09/2021   CREATININE 6.62 (H) 08/08/2021   CREATININE 7.49 (H) 08/07/2021    Intake/Output Summary (Last 24 hours) at 08/09/2021 1250 Last data filed at 08/09/2021 0345 Gross per 24 hour  Intake 120 ml  Output 600 ml  Net -480 ml    2. Metabolic acidosis.  Resolved with sodium bicarb infusion   3.  Anemia of chronic kidney disease Stable  4. Diabetes mellitus type II with chronic kidney disease.   Non-insulin-dependent.  Glucose stable  5.  Hypernatremia likely due to acute kidney injury.  Continue 1/2 normal saline at 75 mL/h. Sodium 146    LOS: 9   2/9/202312:50 PM

## 2021-08-09 NOTE — Progress Notes (Signed)
PROGRESS NOTE    Kaitlin Wagner  YTK:[id number] DOB: July 17, 1945 DOA: 07/31/2021 PCP: Marnee Guarneri, MD    Brief Narrative:  76 year old woman with dementia presented with history of being less responsive, weaker with minimal appetite. Found to have creatinine of 13 from baseline of 1.6.  Admitted for acute renal failure. --Seen by nephrology, slowly improving with conservative management, on IV fluid.  Still with very poor p.o. intake. 2/9: Attempted Dobbhoff tube placement yesterday however patient refused.  Pushes nurses hand away.  Seen by SLP.  No obvious signs of mechanical dysphagia.   Assessment & Plan:   Principal Problem:   Acute renal failure (ARF) (HCC) Active Problems:   Advanced dementia   GERD (gastroesophageal reflux disease)   Pressure injury of right buttock, stage 2 (HCC)   Protein-calorie malnutrition, severe   Uremia   Thrombocytopenia (HCC)   Aortic atherosclerosis (HCC)  Improving non oliguric acute renal failure (ARF) ([place])- (present on admission) - Slowly improving.  Renal ultrasound was unremarkable.  Continue management as per nephrology. Creatinine continues to downtrend 5.95 from 13 Plan: Continue IV fluid 1/2 NS at 75cc/hr Continue to closely monitor urine output strict I's and O's Foley catheter removed on 08/07/2021.  External Foley catheter in place.     Mild hypervolemic hypernatremia Serum sodium 146 Continue half-normal saline,  Daily renal function closely monitor serum sodium while on IV fluid Daily renal parameters  Protein-calorie malnutrition, severe Continue to encourage increase in oral protein calorie intake. Can consider NGT placement and temporary tube feeds We will discuss with outpatient PCP  2/9: Attempted Dobbhoff tube placement and initiation of tube feeds however patient refused.  No clinical indications of mechanical dysphagia. Plan: Continue Remeron for appetite stimulation Decadron 4 mg p.o. daily   Hyperphosphatemia  in the setting of AKI Serum phosphorus 4.7 May benefit from binders, defer to nephrology.   Leukocytosis, suspect reactive Nonseptic appearing White count is down trending Afebrile.   Resolved thrombocytopenia (HCC) Suspect in the setting of acute illness   Uremia -- Secondary to acute renal failure, slowly improving BUN downtrending from greater than 200. Continue to monitor   Distended abdomen CT showed distended bowel loops. No vomiting or pain to suggest symptomatic ileus, but I would expect one with a BUN >200.   - Continue IV fluids We will continue to closely monitor      Pressure injury of right buttock, stage 2 (HCC)- (present on admission) - WOC Continue local wound care   DNR (do not resuscitate)/DNI(Do Not Intubate) Verified DNR/DNI status with dtr [name].   Depression -Hold Celexa Started on Remeron on 08/07/21 Per psychiatry to stimulate appetite   Primary hypertension BP normal off meds   GERD (gastroesophageal reflux disease)- (present on admission) -Continue PPI   Advanced dementia- (present on admission) --At baseline, family report she is oriented to self and family, aware of home surroundings, can converse with them appropriately. -- Unclear if patient is near her baseline however she does have a very flattened affect may be representative of worsening dementia   Acute metabolic encephalopathy-resolved as of 08/05/2021 See above baseline.     DVT prophylaxis: SQ heparin Code Status: DNR Family Communication: Daughter [phone] on 2/9 Disposition Plan: Status is: Inpatient  Remains inpatient appropriate because: Severe renal failure and uremia.  Decreased p.o. intake.     Level of care: Med-Surg  Consultants:  Nephrology Palliative care  Procedures:  None  Antimicrobials: None   Subjective: Patient seen and examined.  Flattened affect  Objective: Vitals:   08/08/21 1950 08/09/21 0331 08/09/21 0500 08/09/21 0742  BP:  120/85 135/72  119/67  Pulse: 81 70  70  Resp: 16 18  18   Temp: 98.3 F (36.8 C) 98.7 F (37.1 C)  98.2 F (36.8 C)  TempSrc: Oral Oral  Tympanic  SpO2: 97% 99%  100%  Weight:   43.9 kg   Height:        Intake/Output Summary (Last 24 hours) at 08/09/2021 1124 Last data filed at 08/09/2021 0345 Gross per 24 hour  Intake 120 ml  Output 600 ml  Net -480 ml   Filed Weights   08/07/21 0453 08/08/21 0447 08/09/21 0500  Weight: 42.6 kg 42.2 kg 43.9 kg    Examination:  General exam: No acute distress.  Frail Respiratory system: Poor respiratory effort.  Lungs clear.  No work of breathing.  Room air Cardiovascular system: S1-S2, RRR, no murmurs, no pedal edema Gastrointestinal system: Soft, NT/ND, normal bowel sounds Central nervous system: A alert, oriented x0, no focal deficits Extremities: Decreased power symmetrically Skin: No rashes, lesions or ulcers Psychiatry: Judgement and insight appear impaired. Mood & affect flattened.     Data Reviewed: I have personally reviewed following labs and imaging studies  CBC: Recent Labs  Lab 08/03/21 0610 08/04/21 0729 08/07/21 0554 08/08/21 0415 08/09/21 0758  WBC 12.0* 16.0* 13.7* 15.5* 15.1*  NEUTROABS  --   --   --   --  12.2*  HGB 10.3* 10.1* 12.1 11.7* 10.9*  HCT 29.5* 29.5* 38.4 37.1 34.7*  MCV 83.8 87.8 94.6 96.6 94.3  PLT 124* 116* 150 151 008*   Basic Metabolic Panel: Recent Labs  Lab 08/03/21 0610 08/04/21 0729 08/05/21 0524 08/07/21 0554 08/08/21 0415 08/09/21 0417  NA 143 144 148* 146* 145 146*  K 3.1* 3.3* 3.5 4.3 4.4 3.6  CL 98 100 103 104 104 106  CO2 29 32 32 31 29 32  GLUCOSE 176* 113* 94 81 70 83  BUN 189* 173* 132* 115* 98* 96*  CREATININE 11.67* 10.78* 9.66* 7.49* 6.62* 5.95*  CALCIUM 7.6* 7.9* 8.3* 8.5* 8.4* 8.3*  MG 2.7*  --   --   --   --  1.8  PHOS 7.6* 5.8*  --  5.3* 5.4* 4.7*   GFR: Estimated Creatinine Clearance: 5.6 mL/min (A) (by C-G formula based on SCr of 5.95 mg/dL (H)). Liver  Function Tests: Recent Labs  Lab 08/03/21 0610 08/04/21 0729 08/07/21 0554 08/08/21 0415  AST 21  --   --   --   ALT 23  --   --   --   ALKPHOS 93  --   --   --   BILITOT 0.8  --   --   --   PROT 6.0*  --   --   --   ALBUMIN 2.8* 2.7* 3.0* 2.8*   No results for input(s): LIPASE, AMYLASE in the last 168 hours. No results for input(s): AMMONIA in the last 168 hours. Coagulation Profile: Recent Labs  Lab 08/03/21 0610  INR 1.2   Cardiac Enzymes: No results for input(s): CKTOTAL, CKMB, CKMBINDEX, TROPONINI in the last 168 hours. BNP (last 3 results) No results for input(s): PROBNP in the last 8760 hours. HbA1C: No results for input(s): HGBA1C in the last 72 hours. CBG: No results for input(s): GLUCAP in the last 168 hours. Lipid Profile: No results for input(s): CHOL, HDL, LDLCALC, TRIG, CHOLHDL, LDLDIRECT in the last 72 hours. Thyroid Function  Tests: No results for input(s): TSH, T4TOTAL, FREET4, T3FREE, THYROIDAB in the last 72 hours. Anemia Panel: No results for input(s): VITAMINB12, FOLATE, FERRITIN, TIBC, IRON, RETICCTPCT in the last 72 hours. Sepsis Labs: No results for input(s): PROCALCITON, LATICACIDVEN in the last 168 hours.  Recent Results (from the past 240 hour(s))  Resp Panel by RT-PCR (Flu A&B, Covid) Nasopharyngeal Swab     Status: None   Collection Time: 07/31/21  3:01 PM   Specimen: Nasopharyngeal Swab; Nasopharyngeal(NP) swabs in vial transport medium  Result Value Ref Range Status   SARS Coronavirus 2 by RT PCR NEGATIVE NEGATIVE Final    Comment: (NOTE) SARS-CoV-2 target nucleic acids are NOT DETECTED.  The SARS-CoV-2 RNA is generally detectable in upper respiratory specimens during the acute phase of infection. The lowest concentration of SARS-CoV-2 viral copies this assay can detect is 138 copies/mL. A negative result does not preclude SARS-Cov-2 infection and should not be used as the sole basis for treatment or other patient management  decisions. A negative result may occur with  improper specimen collection/handling, submission of specimen other than nasopharyngeal swab, presence of viral mutation(s) within the areas targeted by this assay, and inadequate number of viral copies(<138 copies/mL). A negative result must be combined with clinical observations, patient history, and epidemiological information. The expected result is Negative.  Fact Sheet for Patients:  EntrepreneurPulse.com.au  Fact Sheet for Healthcare Providers:  IncredibleEmployment.be  This test is no t yet approved or cleared by the [place] FDA and  has been authorized for detection and/or diagnosis of SARS-CoV-2 by FDA under an Emergency Use Authorization (EUA). This EUA will remain  in effect (meaning this test can be used) for the duration of the COVID-19 declaration under Section 564(b)(1) of the Act, 21 U.S.C.section 360bbb-3(b)(1), unless the authorization is terminated  or revoked sooner.       Influenza A by PCR NEGATIVE NEGATIVE Final   Influenza B by PCR NEGATIVE NEGATIVE Final    Comment: (NOTE) The Xpert Xpress SARS-CoV-2/FLU/RSV plus assay is intended as an aid in the diagnosis of influenza from Nasopharyngeal swab specimens and should not be used as a sole basis for treatment. Nasal washings and aspirates are unacceptable for Xpert Xpress SARS-CoV-2/FLU/RSV testing.  Fact Sheet for Patients: EntrepreneurPulse.com.au  Fact Sheet for Healthcare Providers: IncredibleEmployment.be  This test is not yet approved or cleared by the [place] FDA and has been authorized for detection and/or diagnosis of SARS-CoV-2 by FDA under an Emergency Use Authorization (EUA). This EUA will remain in effect (meaning this test can be used) for the duration of the COVID-19 declaration under Section 564(b)(1) of the Act, 21 U.S.C. section 360bbb-3(b)(1), unless the  authorization is terminated or revoked.  Performed at [facility], [address], [place], [place] [address]          Radiology Studies: No results found.      Scheduled Meds:  Chlorhexidine Gluconate Cloth  6 each Topical Daily   dexamethasone  4 mg Oral Daily   feeding supplement (PROSource TF)  45 mL Per Tube Daily   free water  30 mL Per Tube Q4H   heparin injection (subcutaneous)  5,000 Units Subcutaneous Q8H   mirtazapine  7.5 mg Oral QHS   multivitamin  1 tablet Oral QHS   nutrition supplement (JUVEN)  1 packet Per Tube BID BM   pantoprazole  20 mg Oral Daily   Continuous Infusions:  sodium chloride 75 mL/hr at 08/08/21 0447   feeding supplement (  OSMOLITE 1.5 CAL)       LOS: 9 days      Sidney Ace, MD [facility]   If 7PM-7AM, please contact night-coverage  08/09/2021, 11:24 AM

## 2021-08-10 DIAGNOSIS — N179 Acute kidney failure, unspecified: Secondary | ICD-10-CM | POA: Diagnosis not present

## 2021-08-10 LAB — CBC WITH DIFFERENTIAL/PLATELET
Abs Immature Granulocytes: 0.05 10*3/uL (ref 0.00–0.07)
Basophils Absolute: 0 10*3/uL (ref 0.0–0.1)
Basophils Relative: 0 %
Eosinophils Absolute: 0.5 10*3/uL (ref 0.0–0.5)
Eosinophils Relative: 3 %
HCT: 33.4 % — ABNORMAL LOW (ref 36.0–46.0)
Hemoglobin: 10.5 g/dL — ABNORMAL LOW (ref 12.0–15.0)
Immature Granulocytes: 0 %
Lymphocytes Relative: 10 %
Lymphs Abs: 1.4 10*3/uL (ref 0.7–4.0)
MCH: 29.5 pg (ref 26.0–34.0)
MCHC: 31.4 g/dL (ref 30.0–36.0)
MCV: 93.8 fL (ref 80.0–100.0)
Monocytes Absolute: 0.5 10*3/uL (ref 0.1–1.0)
Monocytes Relative: 4 %
Neutro Abs: 12.3 10*3/uL — ABNORMAL HIGH (ref 1.7–7.7)
Neutrophils Relative %: 83 %
Platelets: 162 10*3/uL (ref 150–400)
RBC: 3.56 MIL/uL — ABNORMAL LOW (ref 3.87–5.11)
RDW: 12.5 % (ref 11.5–15.5)
WBC: 14.8 10*3/uL — ABNORMAL HIGH (ref 4.0–10.5)
nRBC: 0 % (ref 0.0–0.2)

## 2021-08-10 LAB — BASIC METABOLIC PANEL
Anion gap: 9 (ref 5–15)
BUN: 88 mg/dL — ABNORMAL HIGH (ref 8–23)
CO2: 30 mmol/L (ref 22–32)
Calcium: 8.7 mg/dL — ABNORMAL LOW (ref 8.9–10.3)
Chloride: 104 mmol/L (ref 98–111)
Creatinine, Ser: 5.35 mg/dL — ABNORMAL HIGH (ref 0.44–1.00)
GFR, Estimated: 8 mL/min — ABNORMAL LOW (ref >60)
Glucose, Bld: 97 mg/dL (ref 70–99)
Potassium: 3.9 mmol/L (ref 3.5–5.1)
Sodium: 143 mmol/L (ref 135–145)

## 2021-08-10 LAB — PHOSPHORUS: Phosphorus: 4.5 mg/dL (ref 2.5–4.6)

## 2021-08-10 LAB — MAGNESIUM: Magnesium: 1.7 mg/dL (ref 1.7–2.4)

## 2021-08-10 MED ORDER — ENSURE ENLIVE PO LIQD
237.0000 mL | Freq: Three times a day (TID) | ORAL | Status: DC
  Administered 2021-08-11 – 2021-08-16 (×13): 237 mL via ORAL

## 2021-08-10 MED ORDER — ADULT MULTIVITAMIN W/MINERALS CH
1.0000 | ORAL_TABLET | Freq: Every day | ORAL | Status: DC
  Administered 2021-08-11 – 2021-08-16 (×5): 1 via ORAL
  Filled 2021-08-10 (×5): qty 1

## 2021-08-10 NOTE — Progress Notes (Signed)
[facility]  ROUNDING NOTE   Subjective:   Patient seen resting in bed quietly No family at bedside Breakfast tray at bedside, awaiting assistance with meal No lower extremity edema  Creatinine 5.4 Urine output recorded of 800 mL   Objective:  Vital signs in last 24 hours:  Temp:  [98.1 F (36.7 C)-98.9 F (37.2 C)] 98.9 F (37.2 C) (02/10 0737) Pulse Rate:  [69-77] 69 (02/10 0737) Resp:  [16-18] 18 (02/10 0737) BP: (113-126)/(52-67) 126/52 (02/10 0737) SpO2:  [100 %] 100 % (02/10 0737) Weight:  [41 kg] 41 kg (02/10 0500)  Weight change: -2.9 kg Filed Weights   08/08/21 0447 08/09/21 0500 08/10/21 0500  Weight: 42.2 kg 43.9 kg 41 kg    Intake/Output: I/O last 3 completed shifts: In: 100 [P.O.:100] Out: 1000 [Urine:1000]   Intake/Output this shift:  Total I/O In: -  Out: 1000 [Urine:1000]  Physical Exam: General: NAD  Head: Normocephalic, atraumatic. Moist oral mucosal membranes  Eyes: Anicteric  Lungs:  Clear to auscultation, normal effort  Heart: Regular rate and rhythm  Abdomen:  Soft, nontender  Extremities:  no peripheral edema.  Neurologic: Nonfocal, moving all four extremities  Skin: No lesions       Basic Metabolic Panel: Recent Labs  Lab 08/04/21 0729 08/05/21 0524 08/07/21 0554 08/08/21 0415 08/09/21 0417 08/10/21 0507  NA 144 148* 146* 145 146* 143  K 3.3* 3.5 4.3 4.4 3.6 3.9  CL 100 103 104 104 106 104  CO2 32 32 31 29 32 30  GLUCOSE 113* 94 81 70 83 97  BUN 173* 132* 115* 98* 96* 88*  CREATININE 10.78* 9.66* 7.49* 6.62* 5.95* 5.35*  CALCIUM 7.9* 8.3* 8.5* 8.4* 8.3* 8.7*  MG  --   --   --   --  1.8 1.7  PHOS 5.8*  --  5.3* 5.4* 4.7* 4.5     Liver Function Tests: Recent Labs  Lab 08/04/21 0729 08/07/21 0554 08/08/21 0415  ALBUMIN 2.7* 3.0* 2.8*    No results for input(s): LIPASE, AMYLASE in the last 168 hours. No results for input(s): AMMONIA in the last 168 hours.  CBC: Recent Labs  Lab  08/04/21 0729 08/07/21 0554 08/08/21 0415 08/09/21 0758 08/10/21 0507  WBC 16.0* 13.7* 15.5* 15.1* 14.8*  NEUTROABS  --   --   --  12.2* 12.3*  HGB 10.1* 12.1 11.7* 10.9* 10.5*  HCT 29.5* 38.4 37.1 34.7* 33.4*  MCV 87.8 94.6 96.6 94.3 93.8  PLT 116* 150 151 149* 162     Cardiac Enzymes: No results for input(s): CKTOTAL, CKMB, CKMBINDEX, TROPONINI in the last 168 hours.  BNP: Invalid input(s): POCBNP  CBG: No results for input(s): GLUCAP in the last 168 hours.   Microbiology: Results for orders placed or performed during the hospital encounter of 07/31/21  Resp Panel by RT-PCR (Flu A&B, Covid) Nasopharyngeal Swab     Status: None   Collection Time: 07/31/21  3:01 PM   Specimen: Nasopharyngeal Swab; Nasopharyngeal(NP) swabs in vial transport medium  Result Value Ref Range Status   SARS Coronavirus 2 by RT PCR NEGATIVE NEGATIVE Final    Comment: (NOTE) SARS-CoV-2 target nucleic acids are NOT DETECTED.  The SARS-CoV-2 RNA is generally detectable in upper respiratory specimens during the acute phase of infection. The lowest concentration of SARS-CoV-2 viral copies this assay can detect is 138 copies/mL. A negative result does not preclude SARS-Cov-2 infection and should not be used as the sole basis for treatment or other  patient management decisions. A negative result may occur with  improper specimen collection/handling, submission of specimen other than nasopharyngeal swab, presence of viral mutation(s) within the areas targeted by this assay, and inadequate number of viral copies(<138 copies/mL). A negative result must be combined with clinical observations, patient history, and epidemiological information. The expected result is Negative.  Fact Sheet for Patients:  EntrepreneurPulse.com.au  Fact Sheet for Healthcare Providers:  IncredibleEmployment.be  This test is no t yet approved or cleared by the [place] FDA and  has  been authorized for detection and/or diagnosis of SARS-CoV-2 by FDA under an Emergency Use Authorization (EUA). This EUA will remain  in effect (meaning this test can be used) for the duration of the COVID-19 declaration under Section 564(b)(1) of the Act, 21 U.S.C.section 360bbb-3(b)(1), unless the authorization is terminated  or revoked sooner.       Influenza A by PCR NEGATIVE NEGATIVE Final   Influenza B by PCR NEGATIVE NEGATIVE Final    Comment: (NOTE) The Xpert Xpress SARS-CoV-2/FLU/RSV plus assay is intended as an aid in the diagnosis of influenza from Nasopharyngeal swab specimens and should not be used as a sole basis for treatment. Nasal washings and aspirates are unacceptable for Xpert Xpress SARS-CoV-2/FLU/RSV testing.  Fact Sheet for Patients: EntrepreneurPulse.com.au  Fact Sheet for Healthcare Providers: IncredibleEmployment.be  This test is not yet approved or cleared by the [place] FDA and has been authorized for detection and/or diagnosis of SARS-CoV-2 by FDA under an Emergency Use Authorization (EUA). This EUA will remain in effect (meaning this test can be used) for the duration of the COVID-19 declaration under Section 564(b)(1) of the Act, 21 U.S.C. section 360bbb-3(b)(1), unless the authorization is terminated or revoked.  Performed at [facility], [address] [place], [place] [address]     Coagulation Studies: No results for input(s): LABPROT, INR in the last 72 hours.   Urinalysis: No results for input(s): COLORURINE, LABSPEC, PHURINE, GLUCOSEU, HGBUR, BILIRUBINUR, KETONESUR, PROTEINUR, UROBILINOGEN, NITRITE, LEUKOCYTESUR in the last 72 hours.  Invalid input(s): APPERANCEUR     Imaging: No results found.   Medications:    sodium chloride 75 mL/hr at 08/10/21 1051    Chlorhexidine Gluconate Cloth  6 each Topical Daily   dexamethasone  4 mg Oral Daily   free water  30 mL Per Tube  Q4H   heparin injection (subcutaneous)  5,000 Units Subcutaneous Q8H   mirtazapine  7.5 mg Oral QHS   multivitamin  1 tablet Oral QHS   nutrition supplement (JUVEN)  1 packet Per Tube BID BM   pantoprazole  20 mg Oral Daily   acetaminophen **OR** acetaminophen  Assessment/ Plan:  Kaitlin Wagner is a 76 y.o.  female  with a past medical history of GERD, hypertension, diabetes, and hyperlipidemia, who was admitted to [facility] on 07/31/2021 for Acute renal failure (ARF) ([place]) [N17.9] AKI (acute kidney injury) ([place]) [N17.9]   Acute Kidney Injury on chronic kidney disease stage IIIb with baseline creatinine 1.5 and GFR of 33 on 014/26/22.  Acute kidney injury secondary to dehydration, patient appears hypovolemic Chronic kidney disease risk factors include diabetes and hypertension. Renal ultrasound negative for obstruction No IV contrast exposure.  No indication for dialysis at this time.   -Renal function continues to improve with adequate urine output - Appetite remains poor, Decadron started by primary team for appetite stimulant -Palliative care following  Lab Results  Component Value Date   CREATININE 5.35 (H) 08/10/2021   CREATININE 5.95 (  H) 08/09/2021   CREATININE 6.62 (H) 08/08/2021    Intake/Output Summary (Last 24 hours) at 08/10/2021 1457 Last data filed at 08/10/2021 0900 Gross per 24 hour  Intake 100 ml  Output 1800 ml  Net -1700 ml    2. Metabolic acidosis.  Resolved with sodium bicarb infusion   3.  Anemia of chronic kidney disease Hemoglobin stable  4. 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  Glucose stable  5.  Hypernatremia likely due to acute kidney injury.  Continue 1/2 normal saline at 75 mL/h. Sodium improving to 143    LOS: 10   2/10/20232:57 PM

## 2021-08-10 NOTE — Progress Notes (Signed)
Occupational Therapy Treatment Patient Details Name: Kaitlin Wagner MRN: [id number] DOB: Jan 10, 1946 Today's Date: 08/10/2021   History of present illness Pt is a 76 y.o. F  who was sent to ER by PCP for renal failure. Recent SNF stay, and per family pt with functional decline. PMH of advanced dementia, HTN, and CKD IIIb, sacral wound, HLD, DM2, TIA.   OT comments  Upon entering the room, pt supine in bed and alert but oriented to self only. Pt reports being agreeable to OT intervention but refuses OOB activities and therapeutic exercise. Pt does report need for toileting. RN arrived to room and pt needing total A for 2 for rolling L <> R with pt being very resistive with mobility. Pt does not follow commands during session. She continues to yell , "No" with all mobility. Pt has been on trial with OT but has made no progress towards goals or made any progress towards sitting EOB or OOB tasks. OT to SIGN OFF at this time.    Recommendations for follow up therapy are one component of a multi-disciplinary discharge planning process, led by the attending physician.  Recommendations may be updated based on patient status, additional functional criteria and insurance authorization.    Follow Up Recommendations  No OT follow up (if family is unable to care for pt she will likely need LTC)    Assistance Recommended at Discharge Frequent or constant Supervision/Assistance  Patient can return home with the following  Two people to help with walking and/or transfers;Two people to help with bathing/dressing/bathroom;Assistance with cooking/housework;Assistance with feeding;Direct supervision/assist for medications management;Direct supervision/assist for financial management;Assist for transportation;Help with stairs or ramp for entrance   Equipment Recommendations  Other (comment) (hoyer lift and hospital bed if returning home)       Precautions / Restrictions Precautions Precautions: Fall        Mobility Bed Mobility   Bed Mobility: Rolling Rolling: Total assist, +2 for physical assistance              Transfers                   General transfer comment: refusal         ADL either performed or assessed with clinical judgement   ADL                                         General ADL Comments: total A of 2 to roll onto bed pan     Vision Patient Visual Report: No change from baseline            Cognition Arousal/Alertness: Awake/alert Behavior During Therapy: WFL for tasks assessed/performed, Anxious Overall Cognitive Status: History of cognitive impairments - at baseline                                 General Comments: Pt is alert but oriented to self only                   Pertinent Vitals/ Pain       Pain Assessment Pain Assessment: Faces Faces Pain Scale: Hurts whole lot Pain Location: with mobility attempt Pain Descriptors / Indicators: Grimacing Pain Intervention(s): Limited activity within patient's tolerance, Repositioned         Frequency  Other (comment) (d/c this session)  Progress Toward Goals  OT Goals(current goals can now be found in the care plan section)  Progress towards OT goals: Not progressing toward goals - comment (d/c)  Acute Rehab OT Goals Patient Stated Goal: "use bathroom" OT Goal Formulation: With patient Time For Goal Achievement: 08/16/21 Potential to Achieve Goals: [place] Discharge plan remains appropriate;Frequency remains appropriate       AM-PAC OT "6 Clicks" Daily Activity     Outcome Measure   Help from another person eating meals?: A Lot Help from another person taking care of personal grooming?: A Lot Help from another person toileting, which includes using toliet, bedpan, or urinal?: Total Help from another person bathing (including washing, rinsing, drying)?: Total Help from another person to put on and taking off regular upper body  clothing?: Total Help from another person to put on and taking off regular lower body clothing?: Total 6 Click Score: 8    End of Session    OT Visit Diagnosis: Muscle weakness (generalized) (M62.81)   Activity Tolerance Other (comment) (pt unable to tolerate)   Patient Left in bed;with call bell/phone within reach;with bed alarm set;with nursing/sitter in room   Nurse Communication Mobility status        Time: 2334-3568 OT Time Calculation (min): 13 min  Charges: OT General Charges $OT Visit: 1 Visit OT Treatments $Self Care/Home Management : 8-22 mins  Darleen Crocker, MS, OTR/L , CBIS ascom [phone]  08/10/21, 4:06 PM

## 2021-08-10 NOTE — Progress Notes (Addendum)
Order for midline noted. No order from Renal MD regarding approval to place midline on patient they are consulting on. Crystal RN in process of contacting Renal MD for that approval. She will enter consult once approval obtained.

## 2021-08-10 NOTE — Progress Notes (Signed)
PT Cancellation Note  Patient Details Name: Kaitlin Wagner MRN: [id number] DOB: 09-19-1945   Cancelled Treatment:     PT attempt. Pt is alert however unwilling to participate at this time. "Can you come back later today?" Author reoriented pt to time of day and explained that now is her best chance for assistance with getting OOB. She remained unwilling. Acute PT will continue to follow and progress as able per current POC.    Willette Pa 08/10/2021, 4:09 PM

## 2021-08-10 NOTE — Progress Notes (Signed)
PROGRESS NOTE    Kaitlin Wagner  FTD:[id number] DOB: Dec 10, 1945 DOA: 07/31/2021 PCP: Marnee Guarneri, MD    Brief Narrative:  76 year old woman with dementia presented with history of being less responsive, weaker with minimal appetite. Found to have creatinine of 13 from baseline of 1.6.  Admitted for acute renal failure. --Seen by nephrology, slowly improving with conservative management, on IV fluid.  Still with very poor p.o. intake. 2/9: Attempted Dobbhoff tube placement yesterday however patient refused.  Pushes nurses hand away.  Seen by SLP.  No obvious signs of mechanical dysphagia.   Assessment & Plan:   Principal Problem:   Acute renal failure (ARF) (HCC) Active Problems:   Advanced dementia   GERD (gastroesophageal reflux disease)   Pressure injury of right buttock, stage 2 (HCC)   Protein-calorie malnutrition, severe   Uremia   Thrombocytopenia (HCC)   Aortic atherosclerosis (HCC)  Improving non oliguric acute renal failure (ARF) ([place])- (present on admission) - Slowly improving.  Renal ultrasound was unremarkable.  Continue management as per nephrology. Creatinine continues to downtrend 5.35 from 13 Plan: Continue IV fluid 1/2 NS at 75cc/hr Continue to closely monitor urine output strict I's and O's Foley catheter removed on 08/07/2021.  External Foley catheter in place.     Mild hypervolemic hypernatremia Serum sodium 146 Continue half-normal saline,  Daily renal function closely monitor serum sodium while on IV fluid Daily renal parameters  Protein-calorie malnutrition, severe Continue to encourage increase in oral protein calorie intake. Can consider NGT placement and temporary tube feeds We will discuss with outpatient PCP  2/9: Attempted Dobbhoff tube placement and initiation of tube feeds however patient refused.  No clinical indications of mechanical dysphagia. Plan: Continue Remeron for appetite stimulation Continue p.o. Decadron RD  follow-up Palliative care follow-up   Hyperphosphatemia in the setting of AKI Serum phosphorus 4.7 May benefit from binders, defer to nephrology.   Leukocytosis, suspect reactive Nonseptic appearing White count is down trending Afebrile.   Resolved thrombocytopenia (HCC) Suspect in the setting of acute illness   Uremia -- Secondary to acute renal failure, slowly improving BUN downtrending from greater than 200. Continue to monitor   Distended abdomen CT showed distended bowel loops. No vomiting or pain to suggest symptomatic ileus, but I would expect one with a BUN >200.   - Continue IV fluids We will continue to closely monitor      Pressure injury of right buttock, stage 2 (HCC)- (present on admission) - WOC Continue local wound care   DNR (do not resuscitate)/DNI(Do Not Intubate) Verified DNR/DNI status with dtr [name] Discussed that patient is not eating Encouraged family to help in feedings   Depression -Hold Celexa Started on Remeron on 08/07/21 Per psychiatry to stimulate appetite   Primary hypertension BP normal off meds   GERD (gastroesophageal reflux disease)- (present on admission) -Continue PPI   Advanced dementia- (present on admission) --At baseline, family report she is oriented to self and family, aware of home surroundings, can converse with them appropriately. -- Unclear if patient is near her baseline however she does have a very flattened affect may be representative of worsening dementia   Acute metabolic encephalopathy-resolved as of 08/05/2021 See above baseline.     DVT prophylaxis: SQ heparin Code Status: DNR Family Communication: Daughter [phone] on 2/9 Disposition Plan: Status is: Inpatient  Remains inpatient appropriate because: Severe renal failure and uremia.  Decreased p.o. intake.     Level of care: Med-Surg  Consultants:  Nephrology Palliative care  Procedures:   None  Antimicrobials: None   Subjective: Patient seen and examined.  Blunted affect  Objective: Vitals:   08/09/21 1541 08/09/21 1947 08/10/21 0500 08/10/21 0737  BP: 113/67 (!) 117/59  (!) 126/52  Pulse: 77 77  69  Resp: 18 16  18   Temp: 98.1 F (36.7 C) 98.7 F (37.1 C)  98.9 F (37.2 C)  TempSrc: Oral     SpO2: 100% 100%  100%  Weight:   41 kg   Height:        Intake/Output Summary (Last 24 hours) at 08/10/2021 1131 Last data filed at 08/10/2021 0900 Gross per 24 hour  Intake 100 ml  Output 1800 ml  Net -1700 ml   Filed Weights   08/08/21 0447 08/09/21 0500 08/10/21 0500  Weight: 42.2 kg 43.9 kg 41 kg    Examination:  General exam: No acute distress.  Frail-appearing Respiratory system: Poor respiratory effort.  Lungs clear.  Normal work of breathing.  Room air Cardiovascular system: S1-S2, RRR, no murmurs, no pedal edema Gastrointestinal system: Soft, NT/ND, normal bowel sounds Central nervous system: A alert, oriented x1, no focal deficits Extremities: Decreased power symmetrically Skin: No rashes, lesions or ulcers Psychiatry: Judgement and insight appear impaired. Mood & affect flattened.     Data Reviewed: I have personally reviewed following labs and imaging studies  CBC: Recent Labs  Lab 08/04/21 0729 08/07/21 0554 08/08/21 0415 08/09/21 0758 08/10/21 0507  WBC 16.0* 13.7* 15.5* 15.1* 14.8*  NEUTROABS  --   --   --  12.2* 12.3*  HGB 10.1* 12.1 11.7* 10.9* 10.5*  HCT 29.5* 38.4 37.1 34.7* 33.4*  MCV 87.8 94.6 96.6 94.3 93.8  PLT 116* 150 151 149* 093   Basic Metabolic Panel: Recent Labs  Lab 08/04/21 0729 08/05/21 0524 08/07/21 0554 08/08/21 0415 08/09/21 0417 08/10/21 0507  NA 144 148* 146* 145 146* 143  K 3.3* 3.5 4.3 4.4 3.6 3.9  CL 100 103 104 104 106 104  CO2 32 32 31 29 32 30  GLUCOSE 113* 94 81 70 83 97  BUN 173* 132* 115* 98* 96* 88*  CREATININE 10.78* 9.66* 7.49* 6.62* 5.95* 5.35*  CALCIUM 7.9* 8.3* 8.5* 8.4* 8.3*  8.7*  MG  --   --   --   --  1.8 1.7  PHOS 5.8*  --  5.3* 5.4* 4.7* 4.5   GFR: Estimated Creatinine Clearance: 5.8 mL/min (A) (by C-G formula based on SCr of 5.35 mg/dL (H)). Liver Function Tests: Recent Labs  Lab 08/04/21 0729 08/07/21 0554 08/08/21 0415  ALBUMIN 2.7* 3.0* 2.8*   No results for input(s): LIPASE, AMYLASE in the last 168 hours. No results for input(s): AMMONIA in the last 168 hours. Coagulation Profile: No results for input(s): INR, PROTIME in the last 168 hours.  Cardiac Enzymes: No results for input(s): CKTOTAL, CKMB, CKMBINDEX, TROPONINI in the last 168 hours. BNP (last 3 results) No results for input(s): PROBNP in the last 8760 hours. HbA1C: No results for input(s): HGBA1C in the last 72 hours. CBG: No results for input(s): GLUCAP in the last 168 hours. Lipid Profile: No results for input(s): CHOL, HDL, LDLCALC, TRIG, CHOLHDL, LDLDIRECT in the last 72 hours. Thyroid Function Tests: No results for input(s): TSH, T4TOTAL, FREET4, T3FREE, THYROIDAB in the last 72 hours. Anemia Panel: No results for input(s): VITAMINB12, FOLATE, FERRITIN, TIBC, IRON, RETICCTPCT in the last 72 hours. Sepsis Labs: No results for input(s): PROCALCITON, LATICACIDVEN in the last 168 hours.  Recent Results (  from the past 240 hour(s))  Resp Panel by RT-PCR (Flu A&B, Covid) Nasopharyngeal Swab     Status: None   Collection Time: 07/31/21  3:01 PM   Specimen: Nasopharyngeal Swab; Nasopharyngeal(NP) swabs in vial transport medium  Result Value Ref Range Status   SARS Coronavirus 2 by RT PCR NEGATIVE NEGATIVE Final    Comment: (NOTE) SARS-CoV-2 target nucleic acids are NOT DETECTED.  The SARS-CoV-2 RNA is generally detectable in upper respiratory specimens during the acute phase of infection. The lowest concentration of SARS-CoV-2 viral copies this assay can detect is 138 copies/mL. A negative result does not preclude SARS-Cov-2 infection and should not be used as the sole basis  for treatment or other patient management decisions. A negative result may occur with  improper specimen collection/handling, submission of specimen other than nasopharyngeal swab, presence of viral mutation(s) within the areas targeted by this assay, and inadequate number of viral copies(<138 copies/mL). A negative result must be combined with clinical observations, patient history, and epidemiological information. The expected result is Negative.  Fact Sheet for Patients:  EntrepreneurPulse.com.au  Fact Sheet for Healthcare Providers:  IncredibleEmployment.be  This test is no t yet approved or cleared by the [place] FDA and  has been authorized for detection and/or diagnosis of SARS-CoV-2 by FDA under an Emergency Use Authorization (EUA). This EUA will remain  in effect (meaning this test can be used) for the duration of the COVID-19 declaration under Section 564(b)(1) of the Act, 21 U.S.C.section 360bbb-3(b)(1), unless the authorization is terminated  or revoked sooner.       Influenza A by PCR NEGATIVE NEGATIVE Final   Influenza B by PCR NEGATIVE NEGATIVE Final    Comment: (NOTE) The Xpert Xpress SARS-CoV-2/FLU/RSV plus assay is intended as an aid in the diagnosis of influenza from Nasopharyngeal swab specimens and should not be used as a sole basis for treatment. Nasal washings and aspirates are unacceptable for Xpert Xpress SARS-CoV-2/FLU/RSV testing.  Fact Sheet for Patients: EntrepreneurPulse.com.au  Fact Sheet for Healthcare Providers: IncredibleEmployment.be  This test is not yet approved or cleared by the [place] FDA and has been authorized for detection and/or diagnosis of SARS-CoV-2 by FDA under an Emergency Use Authorization (EUA). This EUA will remain in effect (meaning this test can be used) for the duration of the COVID-19 declaration under Section 564(b)(1) of the Act, 21  U.S.C. section 360bbb-3(b)(1), unless the authorization is terminated or revoked.  Performed at [facility], [address] [place], [place] [address]          Radiology Studies: No results found.      Scheduled Meds:  Chlorhexidine Gluconate Cloth  6 each Topical Daily   dexamethasone  4 mg Oral Daily   free water  30 mL Per Tube Q4H   heparin injection (subcutaneous)  5,000 Units Subcutaneous Q8H   mirtazapine  7.5 mg Oral QHS   multivitamin  1 tablet Oral QHS   nutrition supplement (JUVEN)  1 packet Per Tube BID BM   pantoprazole  20 mg Oral Daily   Continuous Infusions:  sodium chloride 75 mL/hr at 08/10/21 1051     LOS: 10 days      Sidney Ace, MD [facility]   If 7PM-7AM, please contact night-coverage  08/10/2021, 11:31 AM

## 2021-08-10 NOTE — Progress Notes (Signed)
Nutrition Follow-up  DOCUMENTATION CODES:   Severe malnutrition in context of chronic illness  INTERVENTION:   If nasogastric tube placed, recommend:  Osmolite 1.5@50ml /hr- Initiate at 73m/hr and increase by 126mhr q 8 hours until goal rate is reached.   Pro-Source 4534maily via tube, provides 40kcal and 11g of protein per serving   Free water flushes 100m63m hours via tube   Regimen provides 1840kcal/day, 86g/day protein and 1514ml25m of free water   Pt at high refeed risk; recommend monitor potassium, magnesium and phosphorus labs daily until stable  Ensure Enlive po TID, each supplement provides 350 kcal and 20 grams of protein.  Magic cup TID with meals, each supplement provides 290 kcal and 9 grams of protein  MVI po daily   NUTRITION DIAGNOSIS:   Severe Malnutrition related to chronic illness (dementia) as evidenced by severe fat depletion, severe muscle depletion, energy intake < or equal to 50% for > or equal to 5 days.  GOAL:   Patient will meet greater than or equal to 90% of their needs -not met   MONITOR:   PO intake, Supplement acceptance, Labs, Weight trends, Skin, I & O's  ASSESSMENT:   75 yo69emale with a PMH of dementia, hypertension, hyperlipidemia, Barretts esophagus, CKD stage 3b, GERD, colon cancer, sacral decubitus, rectal fissures and depression who was admitted on 07/31/2021 with acute renal failure.  Pt refusing NGT. Pt continues to have poor appetite and oral intake in hospital. Pt is documented to have eaten a piece of chicken and a few bites of Magic Cup for dinner last night. RD will add supplements and MVI to help pt meet her estimated needs. Pt remains at high refeed risk. Tube feed recommendations above if pt allows for NGT placement at any point. Pt continues on remeron. Pt remains on a pureed diet. Unsure if bed weights are accurate in chart as pt does not appear to be 90lbs.   Medications reviewed and include: dexamethasone, heparin,  remeron, rena-vit, protonix, NaCl @75ml /hr   Labs reviewed: Na 143 wnl, K 3.9 wnl, BUN 88(H), creat 5.35(H), P 4.5 wnl, Mg 1.7 wnl Wbc- 14.8(H), Hgb 10.5(L), Hct 33.4(L)  Diet Order:   Diet Order             DIET - DYS 1 Room service appropriate? Yes with Assist; Fluid consistency: Thin  Diet effective now                  EDUCATION NEEDS:   Education needs have been addressed  Skin:  Skin Assessment: Skin Integrity Issues: Skin Integrity Issues:: Stage II Stage II: Buttocks  Last BM:  2/6- type 7  Height:   Ht Readings from Last 1 Encounters:  07/31/21 5' 6"  (1.676 m)    Weight:   Wt Readings from Last 1 Encounters:  08/10/21 41 kg    BMI:  Body mass index is 14.59 kg/m.  Estimated Nutritional Needs:   Kcal:  1600-1800kcal/day  Protein:  80-90g/day  Fluid:  1.5-1.8L/day  CaseyKoleen DistanceRD, LDN Please refer to [facility] and/or RD on-call/weekend/after hours pager

## 2021-08-11 DIAGNOSIS — N179 Acute kidney failure, unspecified: Secondary | ICD-10-CM | POA: Diagnosis not present

## 2021-08-11 LAB — CBC WITH DIFFERENTIAL/PLATELET
Abs Immature Granulocytes: 0.05 10*3/uL (ref 0.00–0.07)
Basophils Absolute: 0 10*3/uL (ref 0.0–0.1)
Basophils Relative: 0 %
Eosinophils Absolute: 0.3 10*3/uL (ref 0.0–0.5)
Eosinophils Relative: 2 %
HCT: 29.8 % — ABNORMAL LOW (ref 36.0–46.0)
Hemoglobin: 9.5 g/dL — ABNORMAL LOW (ref 12.0–15.0)
Immature Granulocytes: 0 %
Lymphocytes Relative: 8 %
Lymphs Abs: 1 10*3/uL (ref 0.7–4.0)
MCH: 30 pg (ref 26.0–34.0)
MCHC: 31.9 g/dL (ref 30.0–36.0)
MCV: 94 fL (ref 80.0–100.0)
Monocytes Absolute: 0.5 10*3/uL (ref 0.1–1.0)
Monocytes Relative: 4 %
Neutro Abs: 11.6 10*3/uL — ABNORMAL HIGH (ref 1.7–7.7)
Neutrophils Relative %: 86 %
Platelets: 153 10*3/uL (ref 150–400)
RBC: 3.17 MIL/uL — ABNORMAL LOW (ref 3.87–5.11)
RDW: 12.5 % (ref 11.5–15.5)
WBC: 13.5 10*3/uL — ABNORMAL HIGH (ref 4.0–10.5)
nRBC: 0 % (ref 0.0–0.2)

## 2021-08-11 LAB — BASIC METABOLIC PANEL
Anion gap: 9 (ref 5–15)
BUN: 83 mg/dL — ABNORMAL HIGH (ref 8–23)
CO2: 28 mmol/L (ref 22–32)
Calcium: 8.4 mg/dL — ABNORMAL LOW (ref 8.9–10.3)
Chloride: 106 mmol/L (ref 98–111)
Creatinine, Ser: 4.68 mg/dL — ABNORMAL HIGH (ref 0.44–1.00)
GFR, Estimated: 9 mL/min — ABNORMAL LOW (ref >60)
Glucose, Bld: 98 mg/dL (ref 70–99)
Potassium: 4 mmol/L (ref 3.5–5.1)
Sodium: 143 mmol/L (ref 135–145)

## 2021-08-11 LAB — PHOSPHORUS: Phosphorus: 4.4 mg/dL (ref 2.5–4.6)

## 2021-08-11 LAB — MAGNESIUM: Magnesium: 1.7 mg/dL (ref 1.7–2.4)

## 2021-08-11 LAB — GLUCOSE, CAPILLARY: Glucose-Capillary: 130 mg/dL — ABNORMAL HIGH (ref 70–99)

## 2021-08-11 NOTE — Progress Notes (Signed)
PROGRESS NOTE    Kaitlin Wagner  RNH:[id number] DOB: May 06, 1946 DOA: 07/31/2021 PCP: Marnee Guarneri, MD    Brief Narrative:  76 year old woman with dementia presented with history of being less responsive, weaker with minimal appetite. Found to have creatinine of 13 from baseline of 1.6.  Admitted for acute renal failure. --Seen by nephrology, slowly improving with conservative management, on IV fluid.  Still with very poor p.o. intake. 2/9: Attempted Dobbhoff tube placement yesterday however patient refused.  Pushes nurses hand away.  Seen by SLP.  No obvious signs of mechanical dysphagia.   Assessment & Plan:   Principal Problem:   Acute renal failure (ARF) (HCC) Active Problems:   Advanced dementia   GERD (gastroesophageal reflux disease)   Pressure injury of right buttock, stage 2 (HCC)   Protein-calorie malnutrition, severe   Uremia   Thrombocytopenia (HCC)   Aortic atherosclerosis (HCC)  Improving non oliguric acute renal failure (ARF) ([place])- (present on admission) - Slowly improving.  Renal ultrasound was unremarkable.  Continue management as per nephrology. Creatinine continues to downtrend 4.68 from 13 Plan: Continue IV fluid 1/2 NS at 75cc/hr Continue to closely monitor urine output strict I's and O's Foley catheter removed on 08/07/2021.  External Foley catheter in place.     Mild hypervolemic hypernatremia Serum sodium 143 Continue half-normal saline,  Daily renal function closely monitor serum sodium while on IV fluid Daily renal parameters  Protein-calorie malnutrition, severe Continue to encourage increase in oral protein calorie intake. Can consider NGT placement and temporary tube feeds We will discuss with outpatient PCP  2/9: Attempted Dobbhoff tube placement and initiation of tube feeds however patient refused.  No clinical indications of mechanical dysphagia. Plan: Continue Remeron for appetite stimulation Continue p.o. Decadron RD  follow-up Palliative care follow-up   Hyperphosphatemia in the setting of AKI, improved Serum phosphorus 4.4    Leukocytosis, suspect reactive Nonseptic appearing White count is down trending Afebrile.   Resolved thrombocytopenia (HCC) Suspect in the setting of acute illness   Uremia -- Secondary to acute renal failure, slowly improving BUN downtrending from greater than 200. Continue to monitor   Distended abdomen CT showed distended bowel loops. No vomiting or pain to suggest symptomatic ileus, but I would expect one with a BUN >200.   - Continue IV fluids We will continue to closely monitor   Pressure injury of right buttock, stage 2 (HCC)- (present on admission) - WOC Continue local wound care   DNR (do not resuscitate)/DNI(Do Not Intubate) Verified DNR/DNI status with dtr [name] Discussed that patient is not eating Encouraged family to help in feedings   Depression -Hold Celexa Started on Remeron on 08/07/21 Per psychiatry to stimulate appetite   Primary hypertension BP normal off meds   GERD (gastroesophageal reflux disease)- (present on admission) -Continue PPI   Advanced dementia- (present on admission) --At baseline, family report she is oriented to self and family, aware of home surroundings, can converse with them appropriately. -- Unclear if patient is near her baseline however she does have a very flattened affect may be representative of worsening dementia   Acute metabolic encephalopathy-resolved as of 08/05/2021 See above baseline.     DVT prophylaxis: SQ heparin Code Status: DNR Family Communication: Daughter [phone] on 2/9, 2/11 Disposition Plan: Status is: Inpatient  Remains inpatient appropriate because: Severe renal failure and uremia.  Decreased p.o. intake.     Level of care: Med-Surg  Consultants:  Nephrology Palliative care  Procedures:  None  Antimicrobials: None  Subjective: Patient seen and examined.  Blunted  affect  Objective: Vitals:   08/10/21 2325 08/10/21 2345 08/11/21 0515 08/11/21 0515  BP:   128/67   Pulse: 100  62 66  Resp:   20   Temp:    97.8 F (36.6 C)  TempSrc:    Oral  SpO2:  95% 99% 99%  Weight:      Height:        Intake/Output Summary (Last 24 hours) at 08/11/2021 1208 Last data filed at 08/11/2021 1019 Gross per 24 hour  Intake 240 ml  Output 400 ml  Net -160 ml   Filed Weights   08/08/21 0447 08/09/21 0500 08/10/21 0500  Weight: 42.2 kg 43.9 kg 41 kg    Examination:  General exam: No acute distress.  Blunted affect.  Frail-appearing Respiratory system: Lungs clear.  Normal work of breathing.  Room air Cardiovascular system: S1-S2, RRR, no murmurs, no pedal edema Gastrointestinal system: Soft, NT/ND, normal bowel sounds Central nervous system: A alert, oriented x1, no focal deficits Extremities: Decreased power symmetrically Skin: No rashes, lesions or ulcers Psychiatry: Judgement and insight appear impaired. Mood & affect flattened.     Data Reviewed: I have personally reviewed following labs and imaging studies  CBC: Recent Labs  Lab 08/07/21 0554 08/08/21 0415 08/09/21 0758 08/10/21 0507 08/11/21 0428  WBC 13.7* 15.5* 15.1* 14.8* 13.5*  NEUTROABS  --   --  12.2* 12.3* 11.6*  HGB 12.1 11.7* 10.9* 10.5* 9.5*  HCT 38.4 37.1 34.7* 33.4* 29.8*  MCV 94.6 96.6 94.3 93.8 94.0  PLT 150 151 149* 162 102   Basic Metabolic Panel: Recent Labs  Lab 08/07/21 0554 08/08/21 0415 08/09/21 0417 08/10/21 0507 08/11/21 0428  NA 146* 145 146* 143 143  K 4.3 4.4 3.6 3.9 4.0  CL 104 104 106 104 106  CO2 31 29 32 30 28  GLUCOSE 81 70 83 97 98  BUN 115* 98* 96* 88* 83*  CREATININE 7.49* 6.62* 5.95* 5.35* 4.68*  CALCIUM 8.5* 8.4* 8.3* 8.7* 8.4*  MG  --   --  1.8 1.7 1.7  PHOS 5.3* 5.4* 4.7* 4.5 4.4   GFR: Estimated Creatinine Clearance: 6.6 mL/min (A) (by C-G formula based on SCr of 4.68 mg/dL (H)). Liver Function Tests: Recent Labs  Lab  08/07/21 0554 08/08/21 0415  ALBUMIN 3.0* 2.8*   No results for input(s): LIPASE, AMYLASE in the last 168 hours. No results for input(s): AMMONIA in the last 168 hours. Coagulation Profile: No results for input(s): INR, PROTIME in the last 168 hours.  Cardiac Enzymes: No results for input(s): CKTOTAL, CKMB, CKMBINDEX, TROPONINI in the last 168 hours. BNP (last 3 results) No results for input(s): PROBNP in the last 8760 hours. HbA1C: No results for input(s): HGBA1C in the last 72 hours. CBG: No results for input(s): GLUCAP in the last 168 hours. Lipid Profile: No results for input(s): CHOL, HDL, LDLCALC, TRIG, CHOLHDL, LDLDIRECT in the last 72 hours. Thyroid Function Tests: No results for input(s): TSH, T4TOTAL, FREET4, T3FREE, THYROIDAB in the last 72 hours. Anemia Panel: No results for input(s): VITAMINB12, FOLATE, FERRITIN, TIBC, IRON, RETICCTPCT in the last 72 hours. Sepsis Labs: No results for input(s): PROCALCITON, LATICACIDVEN in the last 168 hours.  No results found for this or any previous visit (from the past 240 hour(s)).        Radiology Studies: No results found.      Scheduled Meds:  dexamethasone  4 mg Oral Daily   feeding  supplement  237 mL Oral TID BM   heparin injection (subcutaneous)  5,000 Units Subcutaneous Q8H   mirtazapine  7.5 mg Oral QHS   multivitamin with minerals  1 tablet Oral Daily   pantoprazole  20 mg Oral Daily   Continuous Infusions:  sodium chloride 75 mL/hr at 08/11/21 0414     LOS: 11 days      Sidney Ace, MD [facility]   If 7PM-7AM, please contact night-coverage  08/11/2021, 12:08 PM

## 2021-08-11 NOTE — Progress Notes (Signed)
[facility]  ROUNDING NOTE   Subjective:   Patient appears agitated, restless Alert Asking to get up, unclear if wanting to move up in bed or get out of bed Breakfast tray at bedside, small amount eaten.  Denies shortness of breath  Creatinine 4.7 Urine out out 1.4L in past 24 hours   Objective:  Vital signs in last 24 hours:  Temp:  [97.8 F (36.6 C)-98.9 F (37.2 C)] 97.8 F (36.6 C) (02/11 0515) Pulse Rate:  [62-114] 66 (02/11 0515) Resp:  [18-20] 20 (02/11 0515) BP: (121-142)/(56-89) 128/67 (02/11 0515) SpO2:  [88 %-100 %] 99 % (02/11 0515)  Weight change:  Filed Weights   08/08/21 0447 08/09/21 0500 08/10/21 0500  Weight: 42.2 kg 43.9 kg 41 kg    Intake/Output: I/O last 3 completed shifts: In: -  Out: 1400 [Urine:1400]   Intake/Output this shift:  Total I/O In: 240 [P.O.:240] Out: -   Physical Exam: General: NAD  Head: Normocephalic, atraumatic. Moist oral mucosal membranes  Eyes: Anicteric  Lungs:  Clear to auscultation, normal effort  Heart: Regular rate and rhythm  Abdomen:  Soft, nontender  Extremities:  no peripheral edema.  Neurologic: Nonfocal, moving all four extremities  Skin: No lesions       Basic Metabolic Panel: Recent Labs  Lab 08/07/21 0554 08/08/21 0415 08/09/21 0417 08/10/21 0507 08/11/21 0428  NA 146* 145 146* 143 143  K 4.3 4.4 3.6 3.9 4.0  CL 104 104 106 104 106  CO2 31 29 32 30 28  GLUCOSE 81 70 83 97 98  BUN 115* 98* 96* 88* 83*  CREATININE 7.49* 6.62* 5.95* 5.35* 4.68*  CALCIUM 8.5* 8.4* 8.3* 8.7* 8.4*  MG  --   --  1.8 1.7 1.7  PHOS 5.3* 5.4* 4.7* 4.5 4.4     Liver Function Tests: Recent Labs  Lab 08/07/21 0554 08/08/21 0415  ALBUMIN 3.0* 2.8*    No results for input(s): LIPASE, AMYLASE in the last 168 hours. No results for input(s): AMMONIA in the last 168 hours.  CBC: Recent Labs  Lab 08/07/21 0554 08/08/21 0415 08/09/21 0758 08/10/21 0507 08/11/21 0428  WBC 13.7* 15.5*  15.1* 14.8* 13.5*  NEUTROABS  --   --  12.2* 12.3* 11.6*  HGB 12.1 11.7* 10.9* 10.5* 9.5*  HCT 38.4 37.1 34.7* 33.4* 29.8*  MCV 94.6 96.6 94.3 93.8 94.0  PLT 150 151 149* 162 153     Cardiac Enzymes: No results for input(s): CKTOTAL, CKMB, CKMBINDEX, TROPONINI in the last 168 hours.  BNP: Invalid input(s): POCBNP  CBG: No results for input(s): GLUCAP in the last 168 hours.   Microbiology: Results for orders placed or performed during the hospital encounter of 07/31/21  Resp Panel by RT-PCR (Flu A&B, Covid) Nasopharyngeal Swab     Status: None   Collection Time: 07/31/21  3:01 PM   Specimen: Nasopharyngeal Swab; Nasopharyngeal(NP) swabs in vial transport medium  Result Value Ref Range Status   SARS Coronavirus 2 by RT PCR NEGATIVE NEGATIVE Final    Comment: (NOTE) SARS-CoV-2 target nucleic acids are NOT DETECTED.  The SARS-CoV-2 RNA is generally detectable in upper respiratory specimens during the acute phase of infection. The lowest concentration of SARS-CoV-2 viral copies this assay can detect is 138 copies/mL. A negative result does not preclude SARS-Cov-2 infection and should not be used as the sole basis for treatment or other patient management decisions. A negative result may occur with  improper specimen collection/handling, submission of specimen other  than nasopharyngeal swab, presence of viral mutation(s) within the areas targeted by this assay, and inadequate number of viral copies(<138 copies/mL). A negative result must be combined with clinical observations, patient history, and epidemiological information. The expected result is Negative.  Fact Sheet for Patients:  EntrepreneurPulse.com.au  Fact Sheet for Healthcare Providers:  IncredibleEmployment.be  This test is no t yet approved or cleared by the [place] FDA and  has been authorized for detection and/or diagnosis of SARS-CoV-2 by FDA under an Emergency Use  Authorization (EUA). This EUA will remain  in effect (meaning this test can be used) for the duration of the COVID-19 declaration under Section 564(b)(1) of the Act, 21 U.S.C.section 360bbb-3(b)(1), unless the authorization is terminated  or revoked sooner.       Influenza A by PCR NEGATIVE NEGATIVE Final   Influenza B by PCR NEGATIVE NEGATIVE Final    Comment: (NOTE) The Xpert Xpress SARS-CoV-2/FLU/RSV plus assay is intended as an aid in the diagnosis of influenza from Nasopharyngeal swab specimens and should not be used as a sole basis for treatment. Nasal washings and aspirates are unacceptable for Xpert Xpress SARS-CoV-2/FLU/RSV testing.  Fact Sheet for Patients: EntrepreneurPulse.com.au  Fact Sheet for Healthcare Providers: IncredibleEmployment.be  This test is not yet approved or cleared by the [place] FDA and has been authorized for detection and/or diagnosis of SARS-CoV-2 by FDA under an Emergency Use Authorization (EUA). This EUA will remain in effect (meaning this test can be used) for the duration of the COVID-19 declaration under Section 564(b)(1) of the Act, 21 U.S.C. section 360bbb-3(b)(1), unless the authorization is terminated or revoked.  Performed at [facility], [address] [place], [place] [address]     Coagulation Studies: No results for input(s): LABPROT, INR in the last 72 hours.   Urinalysis: No results for input(s): COLORURINE, LABSPEC, PHURINE, GLUCOSEU, HGBUR, BILIRUBINUR, KETONESUR, PROTEINUR, UROBILINOGEN, NITRITE, LEUKOCYTESUR in the last 72 hours.  Invalid input(s): APPERANCEUR     Imaging: No results found.   Medications:    sodium chloride 75 mL/hr at 08/11/21 0414    dexamethasone  4 mg Oral Daily   feeding supplement  237 mL Oral TID BM   heparin injection (subcutaneous)  5,000 Units Subcutaneous Q8H   mirtazapine  7.5 mg Oral QHS   multivitamin with minerals  1 tablet  Oral Daily   pantoprazole  20 mg Oral Daily   acetaminophen **OR** acetaminophen  Assessment/ Plan:  [name] is a 76 y.o.  female  with a past medical history of GERD, hypertension, diabetes, and hyperlipidemia, who was admitted to [facility] on 07/31/2021 for Acute renal failure (ARF) ([place]) [N17.9] AKI (acute kidney injury) ([place]) [N17.9]   Acute Kidney Injury on chronic kidney disease stage IIIb with baseline creatinine 1.5 and GFR of 33 on 014/26/22.  Acute kidney injury secondary to dehydration, patient appears hypovolemic Chronic kidney disease risk factors include diabetes and hypertension. Renal ultrasound negative for obstruction No IV contrast exposure.  No indication for dialysis at this time.   -Renal function improving, maintaining adequate urine output -Palliative care following  Lab Results  Component Value Date   CREATININE 4.68 (H) 08/11/2021   CREATININE 5.35 (H) 08/10/2021   CREATININE 5.95 (H) 08/09/2021    Intake/Output Summary (Last 24 hours) at 08/11/2021 1029 Last data filed at 08/11/2021 1019 Gross per 24 hour  Intake 240 ml  Output 400 ml  Net -160 ml    2. Metabolic acidosis.  Resolved  3.  Anemia of chronic kidney disease Hemoglobin stable  4. 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  Glucose stable  5.  Hypernatremia likely due to acute kidney injury. 1/2 normal saline 75 mL/h. Sodium stable 143    LOS: 11   2/11/202310:29 AM

## 2021-08-12 DIAGNOSIS — N179 Acute kidney failure, unspecified: Secondary | ICD-10-CM | POA: Diagnosis not present

## 2021-08-12 LAB — CBC WITH DIFFERENTIAL/PLATELET
Abs Immature Granulocytes: 0.05 10*3/uL (ref 0.00–0.07)
Basophils Absolute: 0 10*3/uL (ref 0.0–0.1)
Basophils Relative: 0 %
Eosinophils Absolute: 0.1 10*3/uL (ref 0.0–0.5)
Eosinophils Relative: 1 %
HCT: 29 % — ABNORMAL LOW (ref 36.0–46.0)
Hemoglobin: 9 g/dL — ABNORMAL LOW (ref 12.0–15.0)
Immature Granulocytes: 0 %
Lymphocytes Relative: 10 %
Lymphs Abs: 1.1 10*3/uL (ref 0.7–4.0)
MCH: 29.8 pg (ref 26.0–34.0)
MCHC: 31 g/dL (ref 30.0–36.0)
MCV: 96 fL (ref 80.0–100.0)
Monocytes Absolute: 0.5 10*3/uL (ref 0.1–1.0)
Monocytes Relative: 5 %
Neutro Abs: 9.6 10*3/uL — ABNORMAL HIGH (ref 1.7–7.7)
Neutrophils Relative %: 84 %
Platelets: 163 10*3/uL (ref 150–400)
RBC: 3.02 MIL/uL — ABNORMAL LOW (ref 3.87–5.11)
RDW: 12.4 % (ref 11.5–15.5)
WBC: 11.4 10*3/uL — ABNORMAL HIGH (ref 4.0–10.5)
nRBC: 0 % (ref 0.0–0.2)

## 2021-08-12 LAB — BASIC METABOLIC PANEL
Anion gap: 8 (ref 5–15)
BUN: 76 mg/dL — ABNORMAL HIGH (ref 8–23)
CO2: 27 mmol/L (ref 22–32)
Calcium: 8.3 mg/dL — ABNORMAL LOW (ref 8.9–10.3)
Chloride: 105 mmol/L (ref 98–111)
Creatinine, Ser: 3.96 mg/dL — ABNORMAL HIGH (ref 0.44–1.00)
GFR, Estimated: 11 mL/min — ABNORMAL LOW (ref >60)
Glucose, Bld: 127 mg/dL — ABNORMAL HIGH (ref 70–99)
Potassium: 3.8 mmol/L (ref 3.5–5.1)
Sodium: 140 mmol/L (ref 135–145)

## 2021-08-12 NOTE — Progress Notes (Signed)
Physical Therapy Discharge Patient Details Name: Kaitlin Wagner MRN: [id number] DOB: 19-Feb-1946 Today's Date: 08/12/2021 Time:  -     Patient discharged from PT services secondary to refusal of care  Please see latest therapy progress note for current level of functioning and progress toward goals.     Offered therapy services again today.  Pt continues to refuse all therapy interventions.  Cognition primary barrier to progression of mobility.  She hollers out and resists all attempts at movement and ROM  Pt was on trial of therapy services but has refused all attempts at this time.  Will DC services at this time.  Discussed with primary PT.     Chesley Noon 08/12/2021, 10:30 AM

## 2021-08-12 NOTE — Progress Notes (Signed)
PROGRESS NOTE    Kaitlin Wagner  EHM:[id number] DOB: 06-Jun-1946 DOA: 07/31/2021 PCP: Marnee Guarneri, MD    Brief Narrative:  76 year old woman with dementia presented with history of being less responsive, weaker with minimal appetite. Found to have creatinine of 13 from baseline of 1.6.  Admitted for acute renal failure. --Seen by nephrology, slowly improving with conservative management, on IV fluid.  Still with very poor p.o. intake. 2/9: Attempted Dobbhoff tube placement yesterday however patient refused.  Pushes nurses hand away.  Seen by SLP.  No obvious signs of mechanical dysphagia.  3/12: Patient has been attempting to eat a little more.  Seems to eat better when family brings in food for her   Assessment & Plan:   Principal Problem:   Acute renal failure (ARF) ([place]) Active Problems:   Advanced dementia   GERD (gastroesophageal reflux disease)   Pressure injury of right buttock, stage 2 (HCC)   Protein-calorie malnutrition, severe   Uremia   Thrombocytopenia (HCC)   Aortic atherosclerosis (HCC)  Improving non oliguric acute renal failure (ARF) ([place])- (present on admission) - Slowly improving.  Renal ultrasound was unremarkable.  Continue management as per nephrology. Creatinine continues to downtrend 3.96 from 13 Plan: Continue IV fluid 1/2 NS at 75cc/hr Continue to closely monitor urine output strict I's and O's Foley catheter removed on 08/07/2021.  External Foley catheter in place.     Mild hypervolemic hypernatremia Serum sodium 140 Continue half-normal saline,  Daily renal function closely monitor serum sodium while on IV fluid Daily renal parameters  Protein-calorie malnutrition, severe Continue to encourage increase in oral protein calorie intake. Can consider NGT placement and temporary tube feeds We will discuss with outpatient PCP  2/9: Attempted Dobbhoff tube placement and initiation of tube feeds however patient refused.  No clinical indications of  mechanical dysphagia. 2/12: Seems to eat better with family bringing food in for Plan: Continue Remeron Continue p.o. Decadron 4 mg daily RD follow-up Palliative care follow-up   Hyperphosphatemia in the setting of AKI, improved Serum phosphorus 4.4    Leukocytosis, suspect reactive Nonseptic appearing White count is down trending Afebrile.   Resolved thrombocytopenia (HCC) Suspect in the setting of acute illness   Uremia -- Secondary to acute renal failure, slowly improving BUN downtrending from greater than 200. Continue to monitor   Distended abdomen CT showed distended bowel loops. No vomiting or pain to suggest symptomatic ileus, but I would expect one with a BUN >200.   Pressure injury of right buttock, stage 2 ([place])- (present on admission) - WOC Continue local wound care   DNR (do not resuscitate)/DNI(Do Not Intubate) Verified DNR/DNI status with dtr [name] Discussed that patient is not eating Encouraged family to help in feedings   Depression -Hold Celexa Started on Remeron on 08/07/21 Per psychiatry to stimulate appetite   Primary hypertension BP normal off meds   GERD (gastroesophageal reflux disease)- (present on admission) -Continue PPI   Advanced dementia- (present on admission) --At baseline, family report she is oriented to self and family, aware of home surroundings, can converse with them appropriately. -- Unclear if patient is near her baseline however she does have a very flattened affect may be representative of worsening dementia   Acute metabolic encephalopathy-resolved as of 08/05/2021 See above baseline.     DVT prophylaxis: SQ heparin Code Status: DNR Family Communication: Daughter [phone] on 2/9, 2/11 Disposition Plan: Status is: Inpatient  Remains inpatient appropriate because: Severe renal failure and uremia.  Decreased p.o.  intake.     Level of care: Med-Surg  Consultants:  Nephrology Palliative care  Procedures:   None  Antimicrobials: None   Subjective: Patient seen and examined.  More awake and communicative this morning  Objective: Vitals:   08/11/21 1716 08/11/21 2135 08/12/21 0545 08/12/21 0901  BP: 131/62 121/68 137/72 (!) 158/59  Pulse: (!) 103 64 80 73  Resp: 18 16 16 18   Temp: 97.9 F (36.6 C) 98.5 F (36.9 C) 97.6 F (36.4 C) 98.6 F (37 C)  TempSrc:  Axillary Oral Oral  SpO2: 96% 94% 97% 100%  Weight:      Height:        Intake/Output Summary (Last 24 hours) at 08/12/2021 0957 Last data filed at 08/12/2021 0602 Gross per 24 hour  Intake 1803.77 ml  Output 400 ml  Net 1403.77 ml   Filed Weights   08/08/21 0447 08/09/21 0500 08/10/21 0500  Weight: 42.2 kg 43.9 kg 41 kg    Examination:  General exam: No acute distress.  Appears frail Respiratory system: Lungs clear.  Normal work of breathing.  Room air Cardiovascular system: S1-S2, RRR, no murmurs, no pedal edema Gastrointestinal system: Soft, NT/ND, normal bowel sounds Central nervous system: Alert, oriented x1, no focal deficits Extremities: Decreased power symmetrically Skin: No rashes, lesions or ulcers Psychiatry: Judgement and insight appear impaired. Mood & affect flattened.     Data Reviewed: I have personally reviewed following labs and imaging studies  CBC: Recent Labs  Lab 08/08/21 0415 08/09/21 0758 08/10/21 0507 08/11/21 0428 08/12/21 0318  WBC 15.5* 15.1* 14.8* 13.5* 11.4*  NEUTROABS  --  12.2* 12.3* 11.6* 9.6*  HGB 11.7* 10.9* 10.5* 9.5* 9.0*  HCT 37.1 34.7* 33.4* 29.8* 29.0*  MCV 96.6 94.3 93.8 94.0 96.0  PLT 151 149* 162 153 295   Basic Metabolic Panel: Recent Labs  Lab 08/07/21 0554 08/08/21 0415 08/09/21 0417 08/10/21 0507 08/11/21 0428 08/12/21 0318  NA 146* 145 146* 143 143 140  K 4.3 4.4 3.6 3.9 4.0 3.8  CL 104 104 106 104 106 105  CO2 31 29 32 30 28 27   GLUCOSE 81 70 83 97 98 127*  BUN 115* 98* 96* 88* 83* 76*  CREATININE 7.49* 6.62* 5.95* 5.35* 4.68* 3.96*   CALCIUM 8.5* 8.4* 8.3* 8.7* 8.4* 8.3*  MG  --   --  1.8 1.7 1.7  --   PHOS 5.3* 5.4* 4.7* 4.5 4.4  --    GFR: Estimated Creatinine Clearance: 7.8 mL/min (A) (by C-G formula based on SCr of 3.96 mg/dL (H)). Liver Function Tests: Recent Labs  Lab 08/07/21 0554 08/08/21 0415  ALBUMIN 3.0* 2.8*   No results for input(s): LIPASE, AMYLASE in the last 168 hours. No results for input(s): AMMONIA in the last 168 hours. Coagulation Profile: No results for input(s): INR, PROTIME in the last 168 hours.  Cardiac Enzymes: No results for input(s): CKTOTAL, CKMB, CKMBINDEX, TROPONINI in the last 168 hours. BNP (last 3 results) No results for input(s): PROBNP in the last 8760 hours. HbA1C: No results for input(s): HGBA1C in the last 72 hours. CBG: Recent Labs  Lab 08/11/21 2126  GLUCAP 130*   Lipid Profile: No results for input(s): CHOL, HDL, LDLCALC, TRIG, CHOLHDL, LDLDIRECT in the last 72 hours. Thyroid Function Tests: No results for input(s): TSH, T4TOTAL, FREET4, T3FREE, THYROIDAB in the last 72 hours. Anemia Panel: No results for input(s): VITAMINB12, FOLATE, FERRITIN, TIBC, IRON, RETICCTPCT in the last 72 hours. Sepsis Labs: No results for input(s):  PROCALCITON, LATICACIDVEN in the last 168 hours.  No results found for this or any previous visit (from the past 240 hour(s)).        Radiology Studies: No results found.      Scheduled Meds:  dexamethasone  4 mg Oral Daily   feeding supplement  237 mL Oral TID BM   heparin injection (subcutaneous)  5,000 Units Subcutaneous Q8H   mirtazapine  7.5 mg Oral QHS   multivitamin with minerals  1 tablet Oral Daily   pantoprazole  20 mg Oral Daily   Continuous Infusions:  sodium chloride 75 mL/hr at 08/12/21 0106     LOS: 12 days      Sidney Ace, MD [facility]   If 7PM-7AM, please contact night-coverage  08/12/2021, 9:57 AM

## 2021-08-12 NOTE — Progress Notes (Signed)
[facility]  ROUNDING NOTE   Subjective:   Patient confused and restless Unable to clearly verbalize her request States she is eating meals Appears upset and anxious   Objective:  Vital signs in last 24 hours:  Temp:  [97.6 F (36.4 C)-98.6 F (37 C)] 98.6 F (37 C) (02/12 0901) Pulse Rate:  [64-103] 73 (02/12 0901) Resp:  [16-18] 18 (02/12 0901) BP: (121-158)/(59-72) 158/59 (02/12 0901) SpO2:  [94 %-100 %] 100 % (02/12 0901)  Weight change:  Filed Weights   08/08/21 0447 08/09/21 0500 08/10/21 0500  Weight: 42.2 kg 43.9 kg 41 kg    Intake/Output: I/O last 3 completed shifts: In: 1878.8 [P.O.:480; I.V.:1398.8] Out: 800 [Urine:800]   Intake/Output this shift:  No intake/output data recorded.  Physical Exam: General: NAD, restless, agitated   Head: Normocephalic, atraumatic. Moist oral mucosal membranes  Eyes: Anicteric  Lungs:  Clear to auscultation, normal effort  Heart: Regular rate and rhythm  Abdomen:  Soft, nontender  Extremities:  no peripheral edema.  Neurologic: Nonfocal, moving all four extremities  Skin: No lesions       Basic Metabolic Panel: Recent Labs  Lab 08/07/21 0554 08/08/21 0415 08/09/21 0417 08/10/21 0507 08/11/21 0428 08/12/21 0318  NA 146* 145 146* 143 143 140  K 4.3 4.4 3.6 3.9 4.0 3.8  CL 104 104 106 104 106 105  CO2 31 29 32 30 28 27   GLUCOSE 81 70 83 97 98 127*  BUN 115* 98* 96* 88* 83* 76*  CREATININE 7.49* 6.62* 5.95* 5.35* 4.68* 3.96*  CALCIUM 8.5* 8.4* 8.3* 8.7* 8.4* 8.3*  MG  --   --  1.8 1.7 1.7  --   PHOS 5.3* 5.4* 4.7* 4.5 4.4  --      Liver Function Tests: Recent Labs  Lab 08/07/21 0554 08/08/21 0415  ALBUMIN 3.0* 2.8*    No results for input(s): LIPASE, AMYLASE in the last 168 hours. No results for input(s): AMMONIA in the last 168 hours.  CBC: Recent Labs  Lab 08/08/21 0415 08/09/21 0758 08/10/21 0507 08/11/21 0428 08/12/21 0318  WBC 15.5* 15.1* 14.8* 13.5* 11.4*   NEUTROABS  --  12.2* 12.3* 11.6* 9.6*  HGB 11.7* 10.9* 10.5* 9.5* 9.0*  HCT 37.1 34.7* 33.4* 29.8* 29.0*  MCV 96.6 94.3 93.8 94.0 96.0  PLT 151 149* 162 153 163     Cardiac Enzymes: No results for input(s): CKTOTAL, CKMB, CKMBINDEX, TROPONINI in the last 168 hours.  BNP: Invalid input(s): POCBNP  CBG: Recent Labs  Lab 08/11/21 2126  GLUCAP 130*     Microbiology: Results for orders placed or performed during the hospital encounter of 07/31/21  Resp Panel by RT-PCR (Flu A&B, Covid) Nasopharyngeal Swab     Status: None   Collection Time: 07/31/21  3:01 PM   Specimen: Nasopharyngeal Swab; Nasopharyngeal(NP) swabs in vial transport medium  Result Value Ref Range Status   SARS Coronavirus 2 by RT PCR NEGATIVE NEGATIVE Final    Comment: (NOTE) SARS-CoV-2 target nucleic acids are NOT DETECTED.  The SARS-CoV-2 RNA is generally detectable in upper respiratory specimens during the acute phase of infection. The lowest concentration of SARS-CoV-2 viral copies this assay can detect is 138 copies/mL. A negative result does not preclude SARS-Cov-2 infection and should not be used as the sole basis for treatment or other patient management decisions. A negative result may occur with  improper specimen collection/handling, submission of specimen other than nasopharyngeal swab, presence of viral mutation(s) within the areas targeted  by this assay, and inadequate number of viral copies(<138 copies/mL). A negative result must be combined with clinical observations, patient history, and epidemiological information. The expected result is Negative.  Fact Sheet for Patients:  EntrepreneurPulse.com.au  Fact Sheet for Healthcare Providers:  IncredibleEmployment.be  This test is no t yet approved or cleared by the [place] FDA and  has been authorized for detection and/or diagnosis of SARS-CoV-2 by FDA under an Emergency Use Authorization (EUA).  This EUA will remain  in effect (meaning this test can be used) for the duration of the COVID-19 declaration under Section 564(b)(1) of the Act, 21 U.S.C.section 360bbb-3(b)(1), unless the authorization is terminated  or revoked sooner.       Influenza A by PCR NEGATIVE NEGATIVE Final   Influenza B by PCR NEGATIVE NEGATIVE Final    Comment: (NOTE) The Xpert Xpress SARS-CoV-2/FLU/RSV plus assay is intended as an aid in the diagnosis of influenza from Nasopharyngeal swab specimens and should not be used as a sole basis for treatment. Nasal washings and aspirates are unacceptable for Xpert Xpress SARS-CoV-2/FLU/RSV testing.  Fact Sheet for Patients: EntrepreneurPulse.com.au  Fact Sheet for Healthcare Providers: IncredibleEmployment.be  This test is not yet approved or cleared by the [place] FDA and has been authorized for detection and/or diagnosis of SARS-CoV-2 by FDA under an Emergency Use Authorization (EUA). This EUA will remain in effect (meaning this test can be used) for the duration of the COVID-19 declaration under Section 564(b)(1) of the Act, 21 U.S.C. section 360bbb-3(b)(1), unless the authorization is terminated or revoked.  Performed at [facility], [address] [place], [place] [address]     Coagulation Studies: No results for input(s): LABPROT, INR in the last 72 hours.   Urinalysis: No results for input(s): COLORURINE, LABSPEC, PHURINE, GLUCOSEU, HGBUR, BILIRUBINUR, KETONESUR, PROTEINUR, UROBILINOGEN, NITRITE, LEUKOCYTESUR in the last 72 hours.  Invalid input(s): APPERANCEUR     Imaging: No results found.   Medications:    sodium chloride 75 mL/hr at 08/12/21 0106    dexamethasone  4 mg Oral Daily   feeding supplement  237 mL Oral TID BM   heparin injection (subcutaneous)  5,000 Units Subcutaneous Q8H   mirtazapine  7.5 mg Oral QHS   multivitamin with minerals  1 tablet Oral Daily    pantoprazole  20 mg Oral Daily   acetaminophen **OR** acetaminophen  Assessment/ Plan:  [name] is a 76 y.o.  female  with a past medical history of GERD, hypertension, diabetes, and hyperlipidemia, who was admitted to [facility] on 07/31/2021 for Acute renal failure (ARF) ([place]) [N17.9] AKI (acute kidney injury) ([place]) [N17.9]   Acute Kidney Injury on chronic kidney disease stage IIIb with baseline creatinine 1.5 and GFR of 33 on 014/26/22.  Acute kidney injury secondary to dehydration, patient appears hypovolemic Chronic kidney disease risk factors include diabetes and hypertension. Renal ultrasound negative for obstruction No IV contrast exposure.  No indication for dialysis at this time.   -Creatinine improving with adequate urine output -Continue IVF due to poor appetite - Continue to encourage oral intake   Lab Results  Component Value Date   CREATININE 3.96 (H) 08/12/2021   CREATININE 4.68 (H) 08/11/2021   CREATININE 5.35 (H) 08/10/2021    Intake/Output Summary (Last 24 hours) at 08/12/2021 1026 Last data filed at 08/12/2021 0602 Gross per 24 hour  Intake 1563.77 ml  Output 400 ml  Net 1163.77 ml    2. Metabolic acidosis.  Resolved   3.  Anemia of chronic kidney disease Remains within acceptable range  4. 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  Glucose stable  5.  Hypernatremia likely due to acute kidney injury. Continue 1/2 normal saline 75 mL/h. Resolved with IVF. May consider changing to 0.9%NS in am.    LOS: 12   2/12/202310:26 AM

## 2021-08-13 DIAGNOSIS — N179 Acute kidney failure, unspecified: Secondary | ICD-10-CM | POA: Diagnosis not present

## 2021-08-13 LAB — CBC WITH DIFFERENTIAL/PLATELET
Abs Immature Granulocytes: 0.03 10*3/uL (ref 0.00–0.07)
Basophils Absolute: 0 10*3/uL (ref 0.0–0.1)
Basophils Relative: 0 %
Eosinophils Absolute: 0.2 10*3/uL (ref 0.0–0.5)
Eosinophils Relative: 3 %
HCT: 31 % — ABNORMAL LOW (ref 36.0–46.0)
Hemoglobin: 9.8 g/dL — ABNORMAL LOW (ref 12.0–15.0)
Immature Granulocytes: 0 %
Lymphocytes Relative: 14 %
Lymphs Abs: 1.4 10*3/uL (ref 0.7–4.0)
MCH: 29.9 pg (ref 26.0–34.0)
MCHC: 31.6 g/dL (ref 30.0–36.0)
MCV: 94.5 fL (ref 80.0–100.0)
Monocytes Absolute: 0.5 10*3/uL (ref 0.1–1.0)
Monocytes Relative: 5 %
Neutro Abs: 7.6 10*3/uL (ref 1.7–7.7)
Neutrophils Relative %: 78 %
Platelets: 178 10*3/uL (ref 150–400)
RBC: 3.28 MIL/uL — ABNORMAL LOW (ref 3.87–5.11)
RDW: 12.6 % (ref 11.5–15.5)
WBC: 9.7 10*3/uL (ref 4.0–10.5)
nRBC: 0 % (ref 0.0–0.2)

## 2021-08-13 LAB — BASIC METABOLIC PANEL
Anion gap: 9 (ref 5–15)
BUN: 70 mg/dL — ABNORMAL HIGH (ref 8–23)
CO2: 23 mmol/L (ref 22–32)
Calcium: 8.2 mg/dL — ABNORMAL LOW (ref 8.9–10.3)
Chloride: 104 mmol/L (ref 98–111)
Creatinine, Ser: 3.47 mg/dL — ABNORMAL HIGH (ref 0.44–1.00)
GFR, Estimated: 13 mL/min — ABNORMAL LOW (ref >60)
Glucose, Bld: 74 mg/dL (ref 70–99)
Potassium: 5.4 mmol/L — ABNORMAL HIGH (ref 3.5–5.1)
Sodium: 136 mmol/L (ref 135–145)

## 2021-08-13 MED ORDER — SODIUM CHLORIDE 0.9 % IV SOLN: INTRAVENOUS | Status: DC

## 2021-08-13 NOTE — Progress Notes (Signed)
[facility]  ROUNDING NOTE   Subjective:   Patient seen sitting in bed, breakfast tray at bedside Alert and oriented to self States she is eating well, per nursing poor appetite remains Denies shortness of breath, pain and discomfort.  Creatinine currently 3.5 Sodium 136 IV fluids at 75 mL/h  Objective:  Vital signs in last 24 hours:  Temp:  [97.7 F (36.5 C)-98.4 F (36.9 C)] 97.9 F (36.6 C) (02/13 0813) Pulse Rate:  [74-77] 75 (02/13 0813) Resp:  [17-20] 17 (02/13 0813) BP: (136-149)/(57-93) 146/74 (02/13 0813) SpO2:  [99 %-100 %] 100 % (02/13 0813) Weight:  [44.6 kg] 44.6 kg (02/13 0500)  Weight change:  Filed Weights   08/09/21 0500 08/10/21 0500 08/13/21 0500  Weight: 43.9 kg 41 kg 44.6 kg    Intake/Output: I/O last 3 completed shifts: In: 624.6 [P.O.:120; I.V.:504.6] Out: 1100 [Urine:1100]   Intake/Output this shift:  No intake/output data recorded.  Physical Exam: General: NAD, resting  Head: Normocephalic, atraumatic. Moist oral mucosal membranes  Eyes: Anicteric  Lungs:  Clear to auscultation, normal effort  Heart: Regular rate and rhythm  Abdomen:  Soft, nontender  Extremities:  no peripheral edema.  Neurologic: Nonfocal, moving all four extremities  Skin: No lesions       Basic Metabolic Panel: Recent Labs  Lab 08/07/21 0554 08/08/21 0415 08/09/21 0417 08/10/21 0507 08/11/21 0428 08/12/21 0318 08/13/21 0443  NA 146* 145 146* 143 143 140 136  K 4.3 4.4 3.6 3.9 4.0 3.8 5.4*  CL 104 104 106 104 106 105 104  CO2 31 29 32 30 28 27 23   GLUCOSE 81 70 83 97 98 127* 74  BUN 115* 98* 96* 88* 83* 76* 70*  CREATININE 7.49* 6.62* 5.95* 5.35* 4.68* 3.96* 3.47*  CALCIUM 8.5* 8.4* 8.3* 8.7* 8.4* 8.3* 8.2*  MG  --   --  1.8 1.7 1.7  --   --   PHOS 5.3* 5.4* 4.7* 4.5 4.4  --   --      Liver Function Tests: Recent Labs  Lab 08/07/21 0554 08/08/21 0415  ALBUMIN 3.0* 2.8*    No results for input(s): LIPASE, AMYLASE in the  last 168 hours. No results for input(s): AMMONIA in the last 168 hours.  CBC: Recent Labs  Lab 08/09/21 0758 08/10/21 0507 08/11/21 0428 08/12/21 0318 08/13/21 0443  WBC 15.1* 14.8* 13.5* 11.4* 9.7  NEUTROABS 12.2* 12.3* 11.6* 9.6* 7.6  HGB 10.9* 10.5* 9.5* 9.0* 9.8*  HCT 34.7* 33.4* 29.8* 29.0* 31.0*  MCV 94.3 93.8 94.0 96.0 94.5  PLT 149* 162 153 163 178     Cardiac Enzymes: No results for input(s): CKTOTAL, CKMB, CKMBINDEX, TROPONINI in the last 168 hours.  BNP: Invalid input(s): POCBNP  CBG: Recent Labs  Lab 08/11/21 2126  GLUCAP 130*     Microbiology: Results for orders placed or performed during the hospital encounter of 07/31/21  Resp Panel by RT-PCR (Flu A&B, Covid) Nasopharyngeal Swab     Status: None   Collection Time: 07/31/21  3:01 PM   Specimen: Nasopharyngeal Swab; Nasopharyngeal(NP) swabs in vial transport medium  Result Value Ref Range Status   SARS Coronavirus 2 by RT PCR NEGATIVE NEGATIVE Final    Comment: (NOTE) SARS-CoV-2 target nucleic acids are NOT DETECTED.  The SARS-CoV-2 RNA is generally detectable in upper respiratory specimens during the acute phase of infection. The lowest concentration of SARS-CoV-2 viral copies this assay can detect is 138 copies/mL. A negative result does not preclude SARS-Cov-2  infection and should not be used as the sole basis for treatment or other patient management decisions. A negative result may occur with  improper specimen collection/handling, submission of specimen other than nasopharyngeal swab, presence of viral mutation(s) within the areas targeted by this assay, and inadequate number of viral copies(<138 copies/mL). A negative result must be combined with clinical observations, patient history, and epidemiological information. The expected result is Negative.  Fact Sheet for Patients:  EntrepreneurPulse.com.au  Fact Sheet for Healthcare Providers:   IncredibleEmployment.be  This test is no t yet approved or cleared by the [place] FDA and  has been authorized for detection and/or diagnosis of SARS-CoV-2 by FDA under an Emergency Use Authorization (EUA). This EUA will remain  in effect (meaning this test can be used) for the duration of the COVID-19 declaration under Section 564(b)(1) of the Act, 21 U.S.C.section 360bbb-3(b)(1), unless the authorization is terminated  or revoked sooner.       Influenza A by PCR NEGATIVE NEGATIVE Final   Influenza B by PCR NEGATIVE NEGATIVE Final    Comment: (NOTE) The Xpert Xpress SARS-CoV-2/FLU/RSV plus assay is intended as an aid in the diagnosis of influenza from Nasopharyngeal swab specimens and should not be used as a sole basis for treatment. Nasal washings and aspirates are unacceptable for Xpert Xpress SARS-CoV-2/FLU/RSV testing.  Fact Sheet for Patients: EntrepreneurPulse.com.au  Fact Sheet for Healthcare Providers: IncredibleEmployment.be  This test is not yet approved or cleared by the [place] FDA and has been authorized for detection and/or diagnosis of SARS-CoV-2 by FDA under an Emergency Use Authorization (EUA). This EUA will remain in effect (meaning this test can be used) for the duration of the COVID-19 declaration under Section 564(b)(1) of the Act, 21 U.S.C. section 360bbb-3(b)(1), unless the authorization is terminated or revoked.  Performed at [facility], [address] [place], [place] [address]     Coagulation Studies: No results for input(s): LABPROT, INR in the last 72 hours.   Urinalysis: No results for input(s): COLORURINE, LABSPEC, PHURINE, GLUCOSEU, HGBUR, BILIRUBINUR, KETONESUR, PROTEINUR, UROBILINOGEN, NITRITE, LEUKOCYTESUR in the last 72 hours.  Invalid input(s): APPERANCEUR     Imaging: No results found.   Medications:    sodium chloride 75 mL/hr at 08/13/21 4627     dexamethasone  4 mg Oral Daily   feeding supplement  237 mL Oral TID BM   heparin injection (subcutaneous)  5,000 Units Subcutaneous Q8H   mirtazapine  7.5 mg Oral QHS   multivitamin with minerals  1 tablet Oral Daily   pantoprazole  20 mg Oral Daily   acetaminophen **OR** acetaminophen  Assessment/ Plan:  [name] is a 76 y.o.  female  with a past medical history of GERD, hypertension, diabetes, and hyperlipidemia, who was admitted to [facility] on 07/31/2021 for Acute renal failure (ARF) ([place]) [N17.9] AKI (acute kidney injury) ([place]) [N17.9]   Acute Kidney Injury on chronic kidney disease stage IIIb with baseline creatinine 1.5 and GFR of 33 on 014/26/22.  Acute kidney injury secondary to dehydration, patient appears hypovolemic Chronic kidney disease risk factors include diabetes and hypertension. Renal ultrasound negative for obstruction No IV contrast exposure.  No indication for dialysis at this time.   -Renal function continues to improve -Poor appetite remains, continue supplementation between meals -We will continue IV fluids  Lab Results  Component Value Date   CREATININE 3.47 (H) 08/13/2021   CREATININE 3.96 (H) 08/12/2021   CREATININE 4.68 (H) 08/11/2021    Intake/Output Summary (  Last 24 hours) at 08/13/2021 1311 Last data filed at 08/12/2021 1414 Gross per 24 hour  Intake 120 ml  Output --  Net 120 ml    2. Metabolic acidosis.  Resolved   3.  Anemia of chronic kidney disease Hemoglobin 9.8  4. 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    5.  Hypernatremia likely due to acute kidney injury.  Sodium 136.  IV fluids remain a necessary part of treatment plan.  We will change IV fluids to 0.9 normal saline at 75 MLS per hour   LOS: 13   2/13/20231:11 PM

## 2021-08-13 NOTE — TOC Progression Note (Addendum)
Transition of Care [facility]) - Progression Note    Patient Details  Name: Kaitlin Wagner MRN: [id number] Date of Birth: March 03, 1946  Transition of Care [facility]) CM/SW Tat Momoli, LCSW Phone Number: 08/13/2021, 12:04 PM  Clinical Narrative:   Bonne Dolores calling patient's [facility] RN to see if there were any updates from them. No answer, voicemail is not set up.  3:16 pm: Tried calling [facility] RN again. No answer.  Expected Discharge Plan:  (TBD) Barriers to Discharge: Continued Medical Work up  Expected Discharge Plan and Services Expected Discharge Plan:  (TBD)       Living arrangements for the past 2 months: Single Family Home                                       Social Determinants of Health (SDOH) Interventions    Readmission Risk Interventions No flowsheet data found.

## 2021-08-13 NOTE — Progress Notes (Signed)
Palliative:    Chart review completed.  Mrs. Kaitlin Wagner is active with the [facility] program.  They work very closely with patients and families for goals of care.    It seems that Mrs. Kaitlin Wagner is continuing to experience failure to thrive, frailty.  Attending shares that he will reach out to Mrs. Kaitlin Wagner's primary care provider with [facility].   Transition of care team is working closely for disposition.  Conference with attending related to patient condition, needs, goals of care, disposition.   Plan:   Mrs. Kaitlin Wagner is active with the [facility] program.  At this point, she is treat the treatable but no CPR or intubation.  Transition of care team is working closely with [facility] for discharge needs.     No charge   Quinn Axe, NP Palliative medicine team Team phone [phone] Greater than 50% of this time was spent counseling and coordinating care related to the above assessment and plan.

## 2021-08-13 NOTE — Progress Notes (Signed)
PROGRESS NOTE    Kaitlin Wagner  NTI:[id number] DOB: [birth year] DOA: 07/31/2021 PCP: Marnee Guarneri, MD    Brief Narrative:  76 year old woman with dementia presented with history of being less responsive, weaker with minimal appetite. Found to have creatinine of 13 from baseline of 1.6.  Admitted for acute renal failure. --Seen by nephrology, slowly improving with conservative management, on IV fluid.  Still with very poor p.o. intake. 2/9: Attempted Dobbhoff tube placement yesterday however patient refused.  Pushes nurses hand away.  Seen by SLP.  No obvious signs of mechanical dysphagia.  3/12: Patient has been attempting to eat a little more.  Seems to eat better when family brings in food for her   Assessment & Plan:   Principal Problem:   Acute renal failure (ARF) ([place]) Active Problems:   Advanced dementia   GERD (gastroesophageal reflux disease)   Pressure injury of right buttock, stage 2 (HCC)   Protein-calorie malnutrition, severe   Uremia   Thrombocytopenia (HCC)   Aortic atherosclerosis (HCC)  Improving non oliguric acute renal failure (ARF) ([place])- (present on admission) - Slowly improving.  Renal ultrasound was unremarkable.  Continue management as per nephrology. Creatinine continues to downtrend 3.47 from 13 Plan: Continue IV fluid 1/2 NS at 75cc/hr Continue to closely monitor urine output strict I's and O's Foley catheter removed on 08/07/2021.  External Foley catheter in place.     Mild hypervolemic hypernatremia Serum sodium 136 Continue half-normal saline Daily renal function closely monitor serum sodium while on IV fluid Daily renal parameters  Protein-calorie malnutrition, severe Continue to encourage increase in oral protein calorie intake. Can consider NGT placement and temporary tube feeds We will discuss with outpatient PCP  2/9: Attempted Dobbhoff tube placement and initiation of tube feeds however patient refused.  No clinical indications of  mechanical dysphagia. 2/12: Seems to eat better with family bringing food in for Plan: Continue Remeron Continue p.o. Decadron 4 mg daily RD follow-up Palliative care follow-up   Hyperphosphatemia in the setting of AKI, improved Serum phosphorus 4.4    Leukocytosis, suspect reactive Nonseptic appearing White count is down trending Afebrile.   Resolved thrombocytopenia (HCC) Suspect in the setting of acute illness   Uremia -- Secondary to acute renal failure, slowly improving BUN downtrending from greater than 200. Continue to monitor   Distended abdomen CT showed distended bowel loops. No vomiting or pain to suggest symptomatic ileus, but I would expect one with a BUN >200.   Pressure injury of right buttock, stage 2 ([place])- (present on admission) - WOC Continue local wound care   DNR (do not resuscitate)/DNI(Do Not Intubate) Verified DNR/DNI status with dtr [name] Discussed that patient is not eating Encouraged family to help in feedings   Depression -Hold Celexa Started on Remeron on 08/07/21 Per psychiatry to stimulate appetite   Primary hypertension BP normal off meds   GERD (gastroesophageal reflux disease)- (present on admission) -Continue PPI   Advanced dementia- (present on admission) --At baseline, family report she is oriented to self and family, aware of home surroundings, can converse with them appropriately. -- Unclear if patient is near her baseline however she does have a very flattened affect may be representative of worsening dementia   Acute metabolic encephalopathy-resolved as of 08/05/2021 See above baseline.     DVT prophylaxis: SQ heparin Code Status: DNR Family Communication: Daughter [phone] on 2/9, 2/11 Disposition Plan: Status is: Inpatient  Remains inpatient appropriate because: Severe renal failure and uremia.  Decreased p.o. intake.  Level of care: Med-Surg  Consultants:  Nephrology Palliative care  Procedures:   None  Antimicrobials: None   Subjective: Patient seen and examined.  More awake and communicative this morning  Objective: Vitals:   08/12/21 1950 08/13/21 0419 08/13/21 0500 08/13/21 0813  BP: (!) 136/57 (!) 149/74  (!) 146/74  Pulse: 75 74  75  Resp: 20 20  17   Temp: 98.4 F (36.9 C) 97.7 F (36.5 C)  97.9 F (36.6 C)  TempSrc: Oral Oral  Oral  SpO2: 100% 99%  100%  Weight:   44.6 kg   Height:        Intake/Output Summary (Last 24 hours) at 08/13/2021 1139 Last data filed at 08/12/2021 1414 Gross per 24 hour  Intake 120 ml  Output --  Net 120 ml   Filed Weights   08/09/21 0500 08/10/21 0500 08/13/21 0500  Weight: 43.9 kg 41 kg 44.6 kg    Examination:  General exam: No acute distress.  Appears frail.  More communicative Respiratory system: Lungs clear.  Normal work of breathing.  Room air Cardiovascular system: S1-S2, RRR, no murmurs, no pedal edema Gastrointestinal system: Soft, NT/ND, hyperactive bowel sounds Central nervous system: Alert, oriented x1, no focal deficits Extremities: Decreased power symmetrically Skin: No rashes, lesions or ulcers Psychiatry: Judgement and insight appear impaired. Mood & affect flattened.     Data Reviewed: I have personally reviewed following labs and imaging studies  CBC: Recent Labs  Lab 08/09/21 0758 08/10/21 0507 08/11/21 0428 08/12/21 0318 08/13/21 0443  WBC 15.1* 14.8* 13.5* 11.4* 9.7  NEUTROABS 12.2* 12.3* 11.6* 9.6* 7.6  HGB 10.9* 10.5* 9.5* 9.0* 9.8*  HCT 34.7* 33.4* 29.8* 29.0* 31.0*  MCV 94.3 93.8 94.0 96.0 94.5  PLT 149* 162 153 163 891   Basic Metabolic Panel: Recent Labs  Lab 08/07/21 0554 08/08/21 0415 08/09/21 0417 08/10/21 0507 08/11/21 0428 08/12/21 0318 08/13/21 0443  NA 146* 145 146* 143 143 140 136  K 4.3 4.4 3.6 3.9 4.0 3.8 5.4*  CL 104 104 106 104 106 105 104  CO2 31 29 32 30 28 27 23   GLUCOSE 81 70 83 97 98 127* 74  BUN 115* 98* 96* 88* 83* 76* 70*  CREATININE 7.49* 6.62*  5.95* 5.35* 4.68* 3.96* 3.47*  CALCIUM 8.5* 8.4* 8.3* 8.7* 8.4* 8.3* 8.2*  MG  --   --  1.8 1.7 1.7  --   --   PHOS 5.3* 5.4* 4.7* 4.5 4.4  --   --    GFR: Estimated Creatinine Clearance: 9.7 mL/min (A) (by C-G formula based on SCr of 3.47 mg/dL (H)). Liver Function Tests: Recent Labs  Lab 08/07/21 0554 08/08/21 0415  ALBUMIN 3.0* 2.8*   No results for input(s): LIPASE, AMYLASE in the last 168 hours. No results for input(s): AMMONIA in the last 168 hours. Coagulation Profile: No results for input(s): INR, PROTIME in the last 168 hours.  Cardiac Enzymes: No results for input(s): CKTOTAL, CKMB, CKMBINDEX, TROPONINI in the last 168 hours. BNP (last 3 results) No results for input(s): PROBNP in the last 8760 hours. HbA1C: No results for input(s): HGBA1C in the last 72 hours. CBG: Recent Labs  Lab 08/11/21 2126  GLUCAP 130*   Lipid Profile: No results for input(s): CHOL, HDL, LDLCALC, TRIG, CHOLHDL, LDLDIRECT in the last 72 hours. Thyroid Function Tests: No results for input(s): TSH, T4TOTAL, FREET4, T3FREE, THYROIDAB in the last 72 hours. Anemia Panel: No results for input(s): VITAMINB12, FOLATE, FERRITIN, TIBC, IRON, RETICCTPCT in  the last 72 hours. Sepsis Labs: No results for input(s): PROCALCITON, LATICACIDVEN in the last 168 hours.  No results found for this or any previous visit (from the past 240 hour(s)).        Radiology Studies: No results found.      Scheduled Meds:  dexamethasone  4 mg Oral Daily   feeding supplement  237 mL Oral TID BM   heparin injection (subcutaneous)  5,000 Units Subcutaneous Q8H   mirtazapine  7.5 mg Oral QHS   multivitamin with minerals  1 tablet Oral Daily   pantoprazole  20 mg Oral Daily   Continuous Infusions:  sodium chloride 75 mL/hr at 08/13/21 0272     LOS: 13 days      Sidney Ace, MD [facility]   If 7PM-7AM, please contact night-coverage  08/13/2021, 11:39 AM

## 2021-08-14 DIAGNOSIS — N179 Acute kidney failure, unspecified: Secondary | ICD-10-CM | POA: Diagnosis not present

## 2021-08-14 LAB — BASIC METABOLIC PANEL
Anion gap: 7 (ref 5–15)
BUN: 59 mg/dL — ABNORMAL HIGH (ref 8–23)
CO2: 24 mmol/L (ref 22–32)
Calcium: 8.3 mg/dL — ABNORMAL LOW (ref 8.9–10.3)
Chloride: 109 mmol/L (ref 98–111)
Creatinine, Ser: 2.9 mg/dL — ABNORMAL HIGH (ref 0.44–1.00)
GFR, Estimated: 16 mL/min — ABNORMAL LOW (ref >60)
Glucose, Bld: 76 mg/dL (ref 70–99)
Potassium: 5.4 mmol/L — ABNORMAL HIGH (ref 3.5–5.1)
Sodium: 140 mmol/L (ref 135–145)

## 2021-08-14 LAB — CBC WITH DIFFERENTIAL/PLATELET
Abs Immature Granulocytes: 0.04 10*3/uL (ref 0.00–0.07)
Basophils Absolute: 0 10*3/uL (ref 0.0–0.1)
Basophils Relative: 0 %
Eosinophils Absolute: 0.5 10*3/uL (ref 0.0–0.5)
Eosinophils Relative: 5 %
HCT: 29.4 % — ABNORMAL LOW (ref 36.0–46.0)
Hemoglobin: 9.3 g/dL — ABNORMAL LOW (ref 12.0–15.0)
Immature Granulocytes: 0 %
Lymphocytes Relative: 17 %
Lymphs Abs: 1.6 10*3/uL (ref 0.7–4.0)
MCH: 29.5 pg (ref 26.0–34.0)
MCHC: 31.6 g/dL (ref 30.0–36.0)
MCV: 93.3 fL (ref 80.0–100.0)
Monocytes Absolute: 0.6 10*3/uL (ref 0.1–1.0)
Monocytes Relative: 6 %
Neutro Abs: 6.6 10*3/uL (ref 1.7–7.7)
Neutrophils Relative %: 72 %
Platelets: 191 10*3/uL (ref 150–400)
RBC: 3.15 MIL/uL — ABNORMAL LOW (ref 3.87–5.11)
RDW: 13 % (ref 11.5–15.5)
WBC: 9.4 10*3/uL (ref 4.0–10.5)
nRBC: 0 % (ref 0.0–0.2)

## 2021-08-14 MED ORDER — SODIUM ZIRCONIUM CYCLOSILICATE 10 G PO PACK
10.0000 g | PACK | Freq: Once | ORAL | Status: DC
  Filled 2021-08-14: qty 1

## 2021-08-14 NOTE — Progress Notes (Addendum)
[facility]  ROUNDING NOTE   Subjective:   Patient seen sitting in bed, breakfast tray at bedside Alert and oriented to self States she is eating well, per nursing poor appetite remains Denies shortness of breath, pain and discomfort.  Patient resting quietly  Breakfast tray at bedside Denies shortness of breath  Creatinine 3.5   Objective:  Vital signs in last 24 hours:  Temp:  [97.4 F (36.3 C)-98.1 F (36.7 C)] 98.1 F (36.7 C) (02/14 0812) Pulse Rate:  [67-86] 86 (02/14 0812) Resp:  [16-18] 18 (02/14 0234) BP: (140-149)/(75-82) 147/76 (02/14 0812) SpO2:  [100 %] 100 % (02/14 0812) Weight:  [44.6 kg] 44.6 kg (02/14 0500)  Weight change: 0 kg Filed Weights   08/10/21 0500 08/13/21 0500 08/14/21 0500  Weight: 41 kg 44.6 kg 44.6 kg    Intake/Output: I/O last 3 completed shifts: In: 1195.3 [I.V.:1195.3] Out: 450 [Urine:450]   Intake/Output this shift:  Total I/O In: 0  Out: 800 [Urine:800]  Physical Exam: General: NAD, resting  Head: Normocephalic, atraumatic. Moist oral mucosal membranes  Eyes: Anicteric  Lungs:  Clear to auscultation, normal effort  Heart: Regular rate and rhythm  Abdomen:  Soft, nontender  Extremities:  no peripheral edema.  Neurologic: Nonfocal, moving all four extremities  Skin: No lesions       Basic Metabolic Panel: Recent Labs  Lab 08/08/21 0415 08/09/21 0417 08/10/21 0507 08/11/21 0428 08/12/21 0318 08/13/21 0443 08/14/21 0921  NA 145 146* 143 143 140 136 140  K 4.4 3.6 3.9 4.0 3.8 5.4* 5.4*  CL 104 106 104 106 105 104 109  CO2 29 32 30 28 27 23 24   GLUCOSE 70 83 97 98 127* 74 76  BUN 98* 96* 88* 83* 76* 70* 59*  CREATININE 6.62* 5.95* 5.35* 4.68* 3.96* 3.47* 2.90*  CALCIUM 8.4* 8.3* 8.7* 8.4* 8.3* 8.2* 8.3*  MG  --  1.8 1.7 1.7  --   --   --   PHOS 5.4* 4.7* 4.5 4.4  --   --   --      Liver Function Tests: Recent Labs  Lab 08/08/21 0415  ALBUMIN 2.8*    No results for input(s):  LIPASE, AMYLASE in the last 168 hours. No results for input(s): AMMONIA in the last 168 hours.  CBC: Recent Labs  Lab 08/10/21 0507 08/11/21 0428 08/12/21 0318 08/13/21 0443 08/14/21 0921  WBC 14.8* 13.5* 11.4* 9.7 9.4  NEUTROABS 12.3* 11.6* 9.6* 7.6 6.6  HGB 10.5* 9.5* 9.0* 9.8* 9.3*  HCT 33.4* 29.8* 29.0* 31.0* 29.4*  MCV 93.8 94.0 96.0 94.5 93.3  PLT 162 153 163 178 191     Cardiac Enzymes: No results for input(s): CKTOTAL, CKMB, CKMBINDEX, TROPONINI in the last 168 hours.  BNP: Invalid input(s): POCBNP  CBG: Recent Labs  Lab 08/11/21 2126  GLUCAP 130*     Microbiology: Results for orders placed or performed during the hospital encounter of 07/31/21  Resp Panel by RT-PCR (Flu A&B, Covid) Nasopharyngeal Swab     Status: None   Collection Time: 07/31/21  3:01 PM   Specimen: Nasopharyngeal Swab; Nasopharyngeal(NP) swabs in vial transport medium  Result Value Ref Range Status   SARS Coronavirus 2 by RT PCR NEGATIVE NEGATIVE Final    Comment: (NOTE) SARS-CoV-2 target nucleic acids are NOT DETECTED.  The SARS-CoV-2 RNA is generally detectable in upper respiratory specimens during the acute phase of infection. The lowest concentration of SARS-CoV-2 viral copies this assay can detect is 138  copies/mL. A negative result does not preclude SARS-Cov-2 infection and should not be used as the sole basis for treatment or other patient management decisions. A negative result may occur with  improper specimen collection/handling, submission of specimen other than nasopharyngeal swab, presence of viral mutation(s) within the areas targeted by this assay, and inadequate number of viral copies(<138 copies/mL). A negative result must be combined with clinical observations, patient history, and epidemiological information. The expected result is Negative.  Fact Sheet for Patients:  EntrepreneurPulse.com.au  Fact Sheet for Healthcare Providers:   IncredibleEmployment.be  This test is no t yet approved or cleared by the [place] FDA and  has been authorized for detection and/or diagnosis of SARS-CoV-2 by FDA under an Emergency Use Authorization (EUA). This EUA will remain  in effect (meaning this test can be used) for the duration of the COVID-19 declaration under Section 564(b)(1) of the Act, 21 U.S.C.section 360bbb-3(b)(1), unless the authorization is terminated  or revoked sooner.       Influenza A by PCR NEGATIVE NEGATIVE Final   Influenza B by PCR NEGATIVE NEGATIVE Final    Comment: (NOTE) The Xpert Xpress SARS-CoV-2/FLU/RSV plus assay is intended as an aid in the diagnosis of influenza from Nasopharyngeal swab specimens and should not be used as a sole basis for treatment. Nasal washings and aspirates are unacceptable for Xpert Xpress SARS-CoV-2/FLU/RSV testing.  Fact Sheet for Patients: EntrepreneurPulse.com.au  Fact Sheet for Healthcare Providers: IncredibleEmployment.be  This test is not yet approved or cleared by the [place] FDA and has been authorized for detection and/or diagnosis of SARS-CoV-2 by FDA under an Emergency Use Authorization (EUA). This EUA will remain in effect (meaning this test can be used) for the duration of the COVID-19 declaration under Section 564(b)(1) of the Act, 21 U.S.C. section 360bbb-3(b)(1), unless the authorization is terminated or revoked.  Performed at [facility], [address] [place], [place] [address]     Coagulation Studies: No results for input(s): LABPROT, INR in the last 72 hours.   Urinalysis: No results for input(s): COLORURINE, LABSPEC, PHURINE, GLUCOSEU, HGBUR, BILIRUBINUR, KETONESUR, PROTEINUR, UROBILINOGEN, NITRITE, LEUKOCYTESUR in the last 72 hours.  Invalid input(s): APPERANCEUR     Imaging: No results found.   Medications:    sodium chloride 75 mL/hr at 08/14/21 9470     dexamethasone  4 mg Oral Daily   feeding supplement  237 mL Oral TID BM   heparin injection (subcutaneous)  5,000 Units Subcutaneous Q8H   mirtazapine  7.5 mg Oral QHS   multivitamin with minerals  1 tablet Oral Daily   pantoprazole  20 mg Oral Daily   sodium zirconium cyclosilicate  10 g Oral Once   acetaminophen **OR** acetaminophen  Assessment/ Plan:  [name] is a 76 y.o.  female  with a past medical history of GERD, hypertension, diabetes, hyperlipidemia [facility] patient, who was admitted to [facility] on 07/31/2021 for Acute renal failure (ARF) ([place]) [N17.9] AKI (acute kidney injury) ([place]) [N17.9]   Acute Kidney Injury with hyperkalemia on chronic kidney disease stage IIIb with baseline creatinine 1.5 and GFR of 33 on 014/26/22.  Acute kidney injury secondary to dehydration, patient appears hypovolemic Chronic kidney disease risk factors include diabetes and hypertension. Renal ultrasound negative for obstruction No IV contrast exposure.  No indication for dialysis at this time.   -Creatinine improving -urine output adequate -Will continue IVF due to poor oral intake - Potassium 5.4, will order Lokelma 10g once  Lab Results  Component  Value Date   CREATININE 2.90 (H) 08/14/2021   CREATININE 3.47 (H) 08/13/2021   CREATININE 3.96 (H) 08/12/2021    Intake/Output Summary (Last 24 hours) at 08/14/2021 1406 Last data filed at 08/14/2021 1043 Gross per 24 hour  Intake 1195.32 ml  Output 1250 ml  Net -54.68 ml    2. Metabolic acidosis.  Resolved   3.  Anemia of chronic kidney disease Hemoglobin 9.8  4. 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    5.  Hypernatremia likely due to acute kidney injury.  Sodium 136.  IV fluids remain a necessary part of treatment plan.  Continue  0.9 normal saline at 75 mls/ hour   LOS: 14   2/14/20232:06 PM

## 2021-08-14 NOTE — Progress Notes (Addendum)
PROGRESS NOTE    Kaitlin Wagner  OHY:[id number] DOB: 05-Jan-1946 DOA: 07/31/2021 PCP: Marnee Guarneri, MD    Brief Narrative:  76 year old woman with dementia presented with history of being less responsive, weaker with minimal appetite. Found to have creatinine of 13 from baseline of 1.6.  Admitted for acute renal failure. --Seen by nephrology, slowly improving with conservative management, on IV fluid.  Still with very poor p.o. intake. 2/9: Attempted Dobbhoff tube placement yesterday however patient refused.  Pushes nurses hand away.  Seen by SLP.  No obvious signs of mechanical dysphagia.  3/12: Patient has been attempting to eat a little more.  Seems to eat better when family brings in food for her  3/14: Appetite remains poor.  Patient intermittently refuses p.o. medications.  Has refused evaluation by physical therapy and has been taken off the list.   Assessment & Plan:   Principal Problem:   Acute renal failure (ARF) ([place]) Active Problems:   Advanced dementia   GERD (gastroesophageal reflux disease)   Pressure injury of right buttock, stage 2 (HCC)   Protein-calorie malnutrition, severe   Uremia   Thrombocytopenia (HCC)   Aortic atherosclerosis (HCC)  Improving non oliguric acute renal failure (ARF) ([place])- (present on admission) - Slowly improving.  Renal ultrasound was unremarkable.  Continue management as per nephrology. Creatinine continues to downtrend 2.90 from 13 Plan: Continue IV fluid 1/2 NS at 75cc/hr Continue to closely monitor urine output strict I's and O's Foley catheter removed on 08/07/2021.  External Foley catheter in place.     Mild hypervolemic hypernatremia Serum sodium 136 Continue half-normal saline Daily renal function closely monitor serum sodium while on IV fluid Daily renal parameters  Protein-calorie malnutrition, severe Continue to encourage increase in oral protein calorie intake. Can consider NGT placement and temporary tube feeds We  will discuss with outpatient PCP  2/9: Attempted Dobbhoff tube placement and initiation of tube feeds however patient refused.  No clinical indications of mechanical dysphagia. 2/12: Seems to eat better with family bringing food in for 2/14: Oral intake remains poor Plan: Continue Remeron Continue p.o. Decadron 4 mg daily RD follow-up Palliative care follow-up   Hyperphosphatemia in the setting of AKI, improved Serum phosphorus 4.4    Leukocytosis, suspect reactive Nonseptic appearing White count is down trending Afebrile.   Resolved thrombocytopenia (HCC) Suspect in the setting of acute illness   Uremia -- Secondary to acute renal failure, slowly improving BUN downtrending from greater than 200. Continue to monitor   Distended abdomen CT showed distended bowel loops. No vomiting or pain to suggest symptomatic ileus, but I would expect one with a BUN >200.   Pressure injury of right buttock, stage 2 ([place])- (present on admission) - WOC Continue local wound care   DNR (do not resuscitate)/DNI(Do Not Intubate) Verified DNR/DNI status with dtr [name] Discussed that patient is not eating Encouraged family to help in feedings   Depression -Hold Celexa Started on Remeron on 08/07/21 Per psychiatry to stimulate appetite   Primary hypertension BP normal off meds   GERD (gastroesophageal reflux disease)- (present on admission) -Continue PPI   Advanced dementia- (present on admission) --At baseline, family report she is oriented to self and family, aware of home surroundings, can converse with them appropriately. -- Unclear if patient is near her baseline however she does have a very flattened affect may be representative of worsening dementia   Acute metabolic encephalopathy-resolved as of 08/05/2021 See above baseline.     DVT prophylaxis: SQ  heparin Code Status: DNR Family Communication: Daughter [phone] on 2/9, 2/11.  [facility] PCP Dr. Conley Rolls [phone] on  2/14 Disposition Plan: Status is: Inpatient  Remains inpatient appropriate because: Severe renal failure and uremia.  Decreased p.o. intake.   Level of care: Med-Surg  Consultants:  Nephrology Palliative care  Procedures:  None  Antimicrobials: None   Subjective: Patient seen and examined.  Awake and communicative  Objective: Vitals:   08/13/21 2016 08/14/21 0234 08/14/21 0500 08/14/21 0812  BP: (!) 149/75 (!) 149/80  (!) 147/76  Pulse: 67 82  86  Resp: 18 18    Temp: (!) 97.4 F (36.3 C) 98 F (36.7 C)  98.1 F (36.7 C)  TempSrc:      SpO2: 100% 100%  100%  Weight:   44.6 kg   Height:        Intake/Output Summary (Last 24 hours) at 08/14/2021 1138 Last data filed at 08/14/2021 1043 Gross per 24 hour  Intake 1195.32 ml  Output 1250 ml  Net -54.68 ml   Filed Weights   08/10/21 0500 08/13/21 0500 08/14/21 0500  Weight: 41 kg 44.6 kg 44.6 kg    Examination:  General exam: No acute distress.  Appears frail Respiratory system: Lungs clear.  Normal work of breathing.  Room air Cardiovascular system: S1-S2, RRR, no murmurs, no pedal edema Gastrointestinal system: Soft, NT/ND, hyperactive bowel sounds Central nervous system: Oriented x1, no focal deficits Extremities: Decreased power symmetrically Skin: No rashes, lesions or ulcers Psychiatry: Judgement and insight appear impaired. Mood & affect flattened.     Data Reviewed: I have personally reviewed following labs and imaging studies  CBC: Recent Labs  Lab 08/10/21 0507 08/11/21 0428 08/12/21 0318 08/13/21 0443 08/14/21 0921  WBC 14.8* 13.5* 11.4* 9.7 9.4  NEUTROABS 12.3* 11.6* 9.6* 7.6 6.6  HGB 10.5* 9.5* 9.0* 9.8* 9.3*  HCT 33.4* 29.8* 29.0* 31.0* 29.4*  MCV 93.8 94.0 96.0 94.5 93.3  PLT 162 153 163 178 952   Basic Metabolic Panel: Recent Labs  Lab 08/08/21 0415 08/09/21 0417 08/10/21 0507 08/11/21 0428 08/12/21 0318 08/13/21 0443 08/14/21 0921  NA 145 146* 143 143 140 136 140  K  4.4 3.6 3.9 4.0 3.8 5.4* 5.4*  CL 104 106 104 106 105 104 109  CO2 29 32 30 28 27 23 24   GLUCOSE 70 83 97 98 127* 74 76  BUN 98* 96* 88* 83* 76* 70* 59*  CREATININE 6.62* 5.95* 5.35* 4.68* 3.96* 3.47* 2.90*  CALCIUM 8.4* 8.3* 8.7* 8.4* 8.3* 8.2* 8.3*  MG  --  1.8 1.7 1.7  --   --   --   PHOS 5.4* 4.7* 4.5 4.4  --   --   --    GFR: Estimated Creatinine Clearance: 11.6 mL/min (A) (by C-G formula based on SCr of 2.9 mg/dL (H)). Liver Function Tests: Recent Labs  Lab 08/08/21 0415  ALBUMIN 2.8*   No results for input(s): LIPASE, AMYLASE in the last 168 hours. No results for input(s): AMMONIA in the last 168 hours. Coagulation Profile: No results for input(s): INR, PROTIME in the last 168 hours.  Cardiac Enzymes: No results for input(s): CKTOTAL, CKMB, CKMBINDEX, TROPONINI in the last 168 hours. BNP (last 3 results) No results for input(s): PROBNP in the last 8760 hours. HbA1C: No results for input(s): HGBA1C in the last 72 hours. CBG: Recent Labs  Lab 08/11/21 2126  GLUCAP 130*   Lipid Profile: No results for input(s): CHOL, HDL, LDLCALC, TRIG, CHOLHDL, LDLDIRECT  in the last 72 hours. Thyroid Function Tests: No results for input(s): TSH, T4TOTAL, FREET4, T3FREE, THYROIDAB in the last 72 hours. Anemia Panel: No results for input(s): VITAMINB12, FOLATE, FERRITIN, TIBC, IRON, RETICCTPCT in the last 72 hours. Sepsis Labs: No results for input(s): PROCALCITON, LATICACIDVEN in the last 168 hours.  No results found for this or any previous visit (from the past 240 hour(s)).        Radiology Studies: No results found.      Scheduled Meds:  dexamethasone  4 mg Oral Daily   feeding supplement  237 mL Oral TID BM   heparin injection (subcutaneous)  5,000 Units Subcutaneous Q8H   mirtazapine  7.5 mg Oral QHS   multivitamin with minerals  1 tablet Oral Daily   pantoprazole  20 mg Oral Daily   sodium zirconium cyclosilicate  10 g Oral Once   Continuous Infusions:   sodium chloride 75 mL/hr at 08/14/21 4854     LOS: 14 days      Sidney Ace, MD [facility]   If 7PM-7AM, please contact night-coverage  08/14/2021, 11:38 AM

## 2021-08-14 NOTE — TOC Progression Note (Signed)
Transition of Care [facility]) - Progression Note    Patient Details  Name: Kaitlin Wagner MRN: [id number] Date of Birth: [birth year]  Transition of Care [facility] CM/SW [place], LCSW Phone Number: 08/14/2021, 1:06 PM  Clinical Narrative:   Received voicemail from patient's PCP from [facility]. She has been in contact with daughter and MD. Plan for SNF vs home still pending. Their therapist will contact daughter today and they will follow up with decision.  Expected Discharge Plan:  (TBD) Barriers to Discharge: Continued Medical Work up  Expected Discharge Plan and Services Expected Discharge Plan:  (TBD)       Living arrangements for the past 2 months: Single Family Home                                       Social Determinants of Health (SDOH) Interventions    Readmission Risk Interventions No flowsheet data found.

## 2021-08-15 ENCOUNTER — Inpatient Hospital Stay: Payer: Medicare (Managed Care)

## 2021-08-15 LAB — CBC WITH DIFFERENTIAL/PLATELET
Abs Immature Granulocytes: 0.04 10*3/uL (ref 0.00–0.07)
Basophils Absolute: 0 10*3/uL (ref 0.0–0.1)
Basophils Relative: 0 %
Eosinophils Absolute: 0.7 10*3/uL — ABNORMAL HIGH (ref 0.0–0.5)
Eosinophils Relative: 7 %
HCT: 32.1 % — ABNORMAL LOW (ref 36.0–46.0)
Hemoglobin: 9.9 g/dL — ABNORMAL LOW (ref 12.0–15.0)
Immature Granulocytes: 0 %
Lymphocytes Relative: 18 %
Lymphs Abs: 1.7 10*3/uL (ref 0.7–4.0)
MCH: 29.3 pg (ref 26.0–34.0)
MCHC: 30.8 g/dL (ref 30.0–36.0)
MCV: 95 fL (ref 80.0–100.0)
Monocytes Absolute: 1 10*3/uL (ref 0.1–1.0)
Monocytes Relative: 10 %
Neutro Abs: 6.1 10*3/uL (ref 1.7–7.7)
Neutrophils Relative %: 65 %
Platelets: 208 10*3/uL (ref 150–400)
RBC: 3.38 MIL/uL — ABNORMAL LOW (ref 3.87–5.11)
RDW: 13 % (ref 11.5–15.5)
WBC: 9.5 10*3/uL (ref 4.0–10.5)
nRBC: 0 % (ref 0.0–0.2)

## 2021-08-15 LAB — BASIC METABOLIC PANEL
Anion gap: 8 (ref 5–15)
BUN: 57 mg/dL — ABNORMAL HIGH (ref 8–23)
CO2: 25 mmol/L (ref 22–32)
Calcium: 8.9 mg/dL (ref 8.9–10.3)
Chloride: 112 mmol/L — ABNORMAL HIGH (ref 98–111)
Creatinine, Ser: 2.85 mg/dL — ABNORMAL HIGH (ref 0.44–1.00)
GFR, Estimated: 17 mL/min — ABNORMAL LOW (ref >60)
Glucose, Bld: 76 mg/dL (ref 70–99)
Potassium: 4.3 mmol/L (ref 3.5–5.1)
Sodium: 145 mmol/L (ref 135–145)

## 2021-08-15 MED ORDER — METOPROLOL TARTRATE 5 MG/5ML IV SOLN: 5.0000 mg | INTRAVENOUS | Status: DC | PRN

## 2021-08-15 MED ORDER — HYDRALAZINE HCL 20 MG/ML IJ SOLN: 10.0000 mg | INTRAMUSCULAR | Status: DC | PRN

## 2021-08-15 MED ORDER — SENNOSIDES-DOCUSATE SODIUM 8.6-50 MG PO TABS: 1.0000 | ORAL_TABLET | Freq: Every evening | ORAL | Status: DC | PRN

## 2021-08-15 MED ORDER — TRAZODONE HCL 50 MG PO TABS: 50.0000 mg | ORAL_TABLET | Freq: Every evening | ORAL | Status: DC | PRN

## 2021-08-15 MED ORDER — IPRATROPIUM-ALBUTEROL 0.5-2.5 (3) MG/3ML IN SOLN: 3.0000 mL | RESPIRATORY_TRACT | Status: DC | PRN

## 2021-08-15 MED ORDER — SODIUM CHLORIDE 0.9 % IV SOLN: INTRAVENOUS | Status: AC

## 2021-08-15 MED ORDER — GUAIFENESIN 100 MG/5ML PO LIQD: 5.0000 mL | ORAL | Status: DC | PRN

## 2021-08-15 NOTE — Progress Notes (Signed)
Patient was able to eat 75% of breakfast and drink 300 ml of liquids.

## 2021-08-15 NOTE — TOC Progression Note (Signed)
Transition of Care [facility]) - Progression Note    Patient Details  Name: Kaitlin Wagner MRN: [id number] Date of Birth: 04-08-1946  Transition [facility]) CM/SW Contact  Beverly Sessions, RN Phone Number: 08/15/2021, 2:10 PM  Clinical Narrative:     Spoke with Dr Wilford Grist at [facility] She is requesting an early morning discharge tomorrow  Patient will have to be hoyer lifted in there her reclining WC that is in the room.  [facility] will then pick the patient up, take her to the center, educate daughter on use of hoyer left.   Called daughter to confirm plan for tomorrow.  Voicemail was full.  Text message sent.  MD notified   Expected Discharge Plan:  (TBD) Barriers to Discharge: Continued Medical Work up  Expected Discharge Plan and Services Expected Discharge Plan:  (TBD)       Living arrangements for the past 2 months: Single Family Home                                       Social Determinants of Health (SDOH) Interventions    Readmission Risk Interventions No flowsheet data found.

## 2021-08-15 NOTE — Progress Notes (Addendum)
[facility]  ROUNDING NOTE   Subjective:   Patient resting in bed Tolerated most of breakfast, feeding assistance by nurse Alert  Creatinine 2.9 UOP 1.5L   Objective:  Vital signs in last 24 hours:  Temp:  [97.7 F (36.5 C)-98.6 F (37 C)] 97.9 F (36.6 C) (02/15 0736) Pulse Rate:  [71-90] 71 (02/15 0736) Resp:  [18] 18 (02/15 0321) BP: (118-143)/(67-93) 143/73 (02/15 0736) SpO2:  [96 %-100 %] 100 % (02/15 0736) Weight:  [42.2 kg] 42.2 kg (02/15 0500)  Weight change: -2.4 kg Filed Weights   08/13/21 0500 08/14/21 0500 08/15/21 0500  Weight: 44.6 kg 44.6 kg 42.2 kg    Intake/Output: I/O last 3 completed shifts: In: 1432.3 [P.O.:237; I.V.:1195.3] Out: 2000 [Urine:2000]   Intake/Output this shift:  Total I/O In: 300 [P.O.:300] Out: -   Physical Exam: General: NAD, resting  Head: Normocephalic, atraumatic. Moist oral mucosal membranes  Eyes: Anicteric  Lungs:  Clear to auscultation, normal effort  Heart: Regular rate and rhythm  Abdomen:  Soft, nontender  Extremities:  no peripheral edema.  Neurologic: Nonfocal, moving all four extremities  Skin: No lesions       Basic Metabolic Panel: Recent Labs  Lab 08/09/21 0417 08/10/21 0507 08/11/21 0428 08/12/21 0318 08/13/21 0443 08/14/21 0921 08/15/21 0841  NA 146* 143 143 140 136 140 145  K 3.6 3.9 4.0 3.8 5.4* 5.4* 4.3  CL 106 104 106 105 104 109 112*  CO2 32 30 28 27 23 24 25   GLUCOSE 83 97 98 127* 74 76 76  BUN 96* 88* 83* 76* 70* 59* 57*  CREATININE 5.95* 5.35* 4.68* 3.96* 3.47* 2.90* 2.85*  CALCIUM 8.3* 8.7* 8.4* 8.3* 8.2* 8.3* 8.9  MG 1.8 1.7 1.7  --   --   --   --   PHOS 4.7* 4.5 4.4  --   --   --   --      Liver Function Tests: No results for input(s): AST, ALT, ALKPHOS, BILITOT, PROT, ALBUMIN in the last 168 hours.  No results for input(s): LIPASE, AMYLASE in the last 168 hours. No results for input(s): AMMONIA in the last 168 hours.  CBC: Recent Labs  Lab  08/11/21 0428 08/12/21 0318 08/13/21 0443 08/14/21 0921 08/15/21 0513  WBC 13.5* 11.4* 9.7 9.4 9.5  NEUTROABS 11.6* 9.6* 7.6 6.6 6.1  HGB 9.5* 9.0* 9.8* 9.3* 9.9*  HCT 29.8* 29.0* 31.0* 29.4* 32.1*  MCV 94.0 96.0 94.5 93.3 95.0  PLT 153 163 178 191 208     Cardiac Enzymes: No results for input(s): CKTOTAL, CKMB, CKMBINDEX, TROPONINI in the last 168 hours.  BNP: Invalid input(s): POCBNP  CBG: Recent Labs  Lab 08/11/21 2126  GLUCAP 130*     Microbiology: Results for orders placed or performed during the hospital encounter of 07/31/21  Resp Panel by RT-PCR (Flu A&B, Covid) Nasopharyngeal Swab     Status: None   Collection Time: 07/31/21  3:01 PM   Specimen: Nasopharyngeal Swab; Nasopharyngeal(NP) swabs in vial transport medium  Result Value Ref Range Status   SARS Coronavirus 2 by RT PCR NEGATIVE NEGATIVE Final    Comment: (NOTE) SARS-CoV-2 target nucleic acids are NOT DETECTED.  The SARS-CoV-2 RNA is generally detectable in upper respiratory specimens during the acute phase of infection. The lowest concentration of SARS-CoV-2 viral copies this assay can detect is 138 copies/mL. A negative result does not preclude SARS-Cov-2 infection and should not be used as the sole basis for treatment or  other patient management decisions. A negative result may occur with  improper specimen collection/handling, submission of specimen other than nasopharyngeal swab, presence of viral mutation(s) within the areas targeted by this assay, and inadequate number of viral copies(<138 copies/mL). A negative result must be combined with clinical observations, patient history, and epidemiological information. The expected result is Negative.  Fact Sheet for Patients:  EntrepreneurPulse.com.au  Fact Sheet for Healthcare Providers:  IncredibleEmployment.be  This test is no t yet approved or cleared by the [place] FDA and  has been authorized for  detection and/or diagnosis of SARS-CoV-2 by FDA under an Emergency Use Authorization (EUA). This EUA will remain  in effect (meaning this test can be used) for the duration of the COVID-19 declaration under Section 564(b)(1) of the Act, 21 U.S.C.section 360bbb-3(b)(1), unless the authorization is terminated  or revoked sooner.       Influenza A by PCR NEGATIVE NEGATIVE Final   Influenza B by PCR NEGATIVE NEGATIVE Final    Comment: (NOTE) The Xpert Xpress SARS-CoV-2/FLU/RSV plus assay is intended as an aid in the diagnosis of influenza from Nasopharyngeal swab specimens and should not be used as a sole basis for treatment. Nasal washings and aspirates are unacceptable for Xpert Xpress SARS-CoV-2/FLU/RSV testing.  Fact Sheet for Patients: EntrepreneurPulse.com.au  Fact Sheet for Healthcare Providers: IncredibleEmployment.be  This test is not yet approved or cleared by the [place] FDA and has been authorized for detection and/or diagnosis of SARS-CoV-2 by FDA under an Emergency Use Authorization (EUA). This EUA will remain in effect (meaning this test can be used) for the duration of the COVID-19 declaration under Section 564(b)(1) of the Act, 21 U.S.C. section 360bbb-3(b)(1), unless the authorization is terminated or revoked.  Performed at [facility], [address] [place],  [address]     Coagulation Studies: No results for input(s): LABPROT, INR in the last 72 hours.   Urinalysis: No results for input(s): COLORURINE, LABSPEC, PHURINE, GLUCOSEU, HGBUR, BILIRUBINUR, KETONESUR, PROTEINUR, UROBILINOGEN, NITRITE, LEUKOCYTESUR in the last 72 hours.  Invalid input(s): APPERANCEUR     Imaging: No results found.   Medications:    sodium chloride 75 mL/hr at 08/15/21 0911    dexamethasone  4 mg Oral Daily   feeding supplement  237 mL Oral TID BM   heparin injection (subcutaneous)  5,000 Units Subcutaneous Q8H    mirtazapine  7.5 mg Oral QHS   multivitamin with minerals  1 tablet Oral Daily   pantoprazole  20 mg Oral Daily   sodium zirconium cyclosilicate  10 g Oral Once   acetaminophen **OR** acetaminophen, guaiFENesin, hydrALAZINE, ipratropium-albuterol, metoprolol tartrate, senna-docusate, traZODone  Assessment/ Plan:  [name] is a 76 y.o.  female  with a past medical history of GERD, hypertension, diabetes, hyperlipidemia [facility] patient, who was admitted to [facility] on 07/31/2021 for Acute renal failure (ARF) ([place]) [N17.9] AKI (acute kidney injury) ([place]) [N17.9]   Acute Kidney Injury with hyperkalemia on chronic kidney disease stage IIIb with baseline creatinine 1.5 and GFR of 33 on 014/26/22.  Acute kidney injury secondary to dehydration, patient appears hypovolemic Chronic kidney disease risk factors include diabetes and hypertension. Renal ultrasound negative for obstruction No IV contrast exposure.  No indication for dialysis at this time.   -Renal function slowly recovering -UOP 1.5L -Will continue IVF due to poor oral intake - Potassium 4.3, improved  with Lokelma.   Lab Results  Component Value Date   CREATININE 2.85 (H) 08/15/2021   CREATININE 2.90 (H)  08/14/2021   CREATININE 3.47 (H) 08/13/2021    Intake/Output Summary (Last 24 hours) at 08/15/2021 1348 Last data filed at 08/15/2021 0916 Gross per 24 hour  Intake 537 ml  Output 750 ml  Net -213 ml    2. Hypernatremia   -  Continued on  0.9% normal saline at 75 mls/ hour.  -  Assistance required at meal time.    3.  Anemia of chronic kidney disease Hemoglobin 9.9 No indication for ESA  4. 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    5.  Metabolic acidosis.  Resolved   LOS: [place] 2/15/20231:48 PM   Patient was examined and evaluated with Colon Flattery, NP.  Plan of care was formulated and discussed with patient as well as NP.  I agree with the note as  documented with edits.

## 2021-08-15 NOTE — Progress Notes (Signed)
IV occluded patient refusing IV restart. IVF on hold.

## 2021-08-15 NOTE — Progress Notes (Addendum)
Cross Cover Nurse reports abdominal distension, high pitched tympanic hyperactive bowel sounds. Nurse reports episode of diarrhea reported from previous RN. KUB - per report mild dilated in central abdomen  Present on multiple imaging studies prior.  Given no leukocytosis, symptoms, or fever at time of admission. May need additional surgical evaluation Currently she is not in distress and ordered.

## 2021-08-15 NOTE — Progress Notes (Signed)
PROGRESS NOTE    Kaitlin Wagner  ZOX:[id number] DOB: [birth year] DOA: 07/31/2021 PCP: Marnee Guarneri, MD   Brief Narrative:  76 year old female with history of dementia, GERD, depression, stage II right buttock pressure injury, severe protein calorie malnutrition, thrombocytopenia comes to the hospital for evaluation of progressive weakness and poor oral intake.  Upon admission she was found to be in acute kidney failure with creatinine of 13.  Baseline creatinine 1.6.  Nephrology team was consulted and patient was started on IV fluids.  Slowly her renal function has been improving.  She continues to have very poor oral intake and needs significant amount of encouragement with family.   Assessment & Plan:   Acute renal failure, oligoanuric Metabolic acidosis, resolved Uremia, improving - Baseline creatinine 1.6, admission creatinine 13.  Since admission patient has been getting IV fluids and this has been slowly downtrending.  Nephrology team is following. -Renal ultrasound-negative -CT abdomen pelvis that showed bilateral renal stones without any obstruction. -Foley catheter removed 2/7  Mild hyperkalemia - Elevated yesterday.  No labs from today, stat labs ordered.  Lokelma ordered yesterday.  Leukocytosis - Improving.  Remains afebrile.  No evidence of active infection at this time we will continue to monitor.  Severe protein calorie malnutrition - Previously have attempted Dobbhoff but patient has refused this.  Encourage oral intake.  Dietitian and palliative care team following - On Remeron and Decadron 4 mg daily  Bilateral renal stones without any obstruction - Getting IV fluids.  Depression - On Remeron at bedtime  Advanced dementia - Supportive care.  GERD - PPI  Essential hypertension - Does not seem like she was on any home meds?.  Per records it appears she used to be on Norvasc  History of hyperlipidemia - No longer on statin  Stage II pressure ulcer on the  buttock, right.  Present on admission - Supportive care   DVT prophylaxis: Subcu heparin Code Status: DNR Family Communication:  Daughter updated  Status is: Inpatient  Remains inpatient appropriate because: Maintain hospital stay until cleared by nephrology.  Ongoing disposition conversation between [facility] and the patient's daughter.     Nutritional status  Nutrition Problem: Severe Malnutrition Etiology: chronic illness (dementia)  Signs/Symptoms: severe fat depletion, severe muscle depletion, energy intake < or equal to 50% for > or equal to 5 days  Interventions: Nepro shake, MVI  Body mass index is 15.02 kg/m.  Pressure Injury 10/20/20 Sacrum Posterior;Mid Stage 3 -  Full thickness tissue loss. Subcutaneous fat may be visible but bone, tendon or muscle are NOT exposed. reddened (Active)  10/20/20 1800  Location: Sacrum  Location Orientation: Posterior;Mid  Staging: Stage 3 -  Full thickness tissue loss. Subcutaneous fat may be visible but bone, tendon or muscle are NOT exposed.  Wound Description (Comments): reddened  Present on Admission: Yes     Pressure Injury 07/31/21 Buttocks Mid Stage 2 -  Partial thickness loss of dermis presenting as a shallow open injury with a red, pink wound bed without slough. (Active)  07/31/21 2300  Location: Buttocks  Location Orientation: Mid  Staging: Stage 2 -  Partial thickness loss of dermis presenting as a shallow open injury with a red, pink wound bed without slough.  Wound Description (Comments):   Present on Admission: Yes     Pressure Injury 08/13/21 Ankle Posterior;Right Stage 1 -  Intact skin with non-blanchable redness of a localized area usually over a bony prominence. (Active)  08/13/21 2345  Location: Ankle  Location  Orientation: Posterior;Right  Staging: Stage 1 -  Intact skin with non-blanchable redness of a localized area usually over a bony prominence.  Wound Description (Comments):   Present on Admission:            Subjective: Doing ok, no complaints this morning.   Review of Systems Otherwise negative except as per HPI, including: General: Denies fever, chills, night sweats or unintended weight loss. Resp: Denies cough, wheezing, shortness of breath. Cardiac: Denies chest pain, palpitations, orthopnea, paroxysmal nocturnal dyspnea. GI: Denies abdominal pain, nausea, vomiting, diarrhea or constipation GU: Denies dysuria, frequency, hesitancy or incontinence MS: Denies muscle aches, joint pain or swelling Neuro: Denies headache, neurologic deficits (focal weakness, numbness, tingling), abnormal gait Psych: Denies anxiety, depression, SI/HI/AVH Skin: Denies new rashes or lesions ID: Denies sick contacts, exotic exposures, travel  Examination:  General exam: Appears calm and comfortable  Respiratory system: Clear to auscultation. Respiratory effort normal. Cardiovascular system: S1 & S2 heard, RRR. No JVD, murmurs, rubs, gallops or clicks. No pedal edema. Gastrointestinal system: Abdomen is nondistended, soft and nontender. No organomegaly or masses felt. Normal bowel sounds heard. Central nervous system: Alert and oriented. No focal neurological deficits. Extremities: Symmetric 5 x 5 power. Skin: No rashes, lesions or ulcers Psychiatry: Judgement and insight appear normal. Mood & affect appropriate.     Objective: Vitals:   08/14/21 2006 08/15/21 0321 08/15/21 0500 08/15/21 0736  BP: 128/73 (!) 118/93  (!) 143/73  Pulse:  90  71  Resp: 18 18    Temp: 98.6 F (37 C) 98.5 F (36.9 C)  97.9 F (36.6 C)  TempSrc: Oral     SpO2:  96%  100%  Weight:   42.2 kg   Height:        Intake/Output Summary (Last 24 hours) at 08/15/2021 0808 Last data filed at 08/14/2021 2000 Gross per 24 hour  Intake 237 ml  Output 1550 ml  Net -1313 ml   Filed Weights   08/13/21 0500 08/14/21 0500 08/15/21 0500  Weight: 44.6 kg 44.6 kg 42.2 kg     Data Reviewed:   CBC: Recent Labs   Lab 08/11/21 0428 08/12/21 0318 08/13/21 0443 08/14/21 0921 08/15/21 0513  WBC 13.5* 11.4* 9.7 9.4 9.5  NEUTROABS 11.6* 9.6* 7.6 6.6 6.1  HGB 9.5* 9.0* 9.8* 9.3* 9.9*  HCT 29.8* 29.0* 31.0* 29.4* 32.1*  MCV 94.0 96.0 94.5 93.3 95.0  PLT 153 163 178 191 629   Basic Metabolic Panel: Recent Labs  Lab 08/09/21 0417 08/10/21 0507 08/11/21 0428 08/12/21 0318 08/13/21 0443 08/14/21 0921  NA 146* 143 143 140 136 140  K 3.6 3.9 4.0 3.8 5.4* 5.4*  CL 106 104 106 105 104 109  CO2 32 30 28 27 23 24   GLUCOSE 83 97 98 127* 74 76  BUN 96* 88* 83* 76* 70* 59*  CREATININE 5.95* 5.35* 4.68* 3.96* 3.47* 2.90*  CALCIUM 8.3* 8.7* 8.4* 8.3* 8.2* 8.3*  MG 1.8 1.7 1.7  --   --   --   PHOS 4.7* 4.5 4.4  --   --   --    GFR: Estimated Creatinine Clearance: 11 mL/min (A) (by C-G formula based on SCr of 2.9 mg/dL (H)). Liver Function Tests: No results for input(s): AST, ALT, ALKPHOS, BILITOT, PROT, ALBUMIN in the last 168 hours. No results for input(s): LIPASE, AMYLASE in the last 168 hours. No results for input(s): AMMONIA in the last 168 hours. Coagulation Profile: No results for input(s): INR, PROTIME in the  last 168 hours. Cardiac Enzymes: No results for input(s): CKTOTAL, CKMB, CKMBINDEX, TROPONINI in the last 168 hours. BNP (last 3 results) No results for input(s): PROBNP in the last 8760 hours. HbA1C: No results for input(s): HGBA1C in the last 72 hours. CBG: Recent Labs  Lab 08/11/21 2126  GLUCAP 130*   Lipid Profile: No results for input(s): CHOL, HDL, LDLCALC, TRIG, CHOLHDL, LDLDIRECT in the last 72 hours. Thyroid Function Tests: No results for input(s): TSH, T4TOTAL, FREET4, T3FREE, THYROIDAB in the last 72 hours. Anemia Panel: No results for input(s): VITAMINB12, FOLATE, FERRITIN, TIBC, IRON, RETICCTPCT in the last 72 hours. Sepsis Labs: No results for input(s): PROCALCITON, LATICACIDVEN in the last 168 hours.  No results found for this or any previous visit (from the  past 240 hour(s)).       Radiology Studies: No results found.      Scheduled Meds:  dexamethasone  4 mg Oral Daily   feeding supplement  237 mL Oral TID BM   heparin injection (subcutaneous)  5,000 Units Subcutaneous Q8H   mirtazapine  7.5 mg Oral QHS   multivitamin with minerals  1 tablet Oral Daily   pantoprazole  20 mg Oral Daily   sodium zirconium cyclosilicate  10 g Oral Once   Continuous Infusions:   LOS: 15 days   Time spent= 35 mins    Travell Desaulniers Arsenio Loader, MD [facility]  If 7PM-7AM, please contact night-coverage  08/15/2021, 8:08 AM

## 2021-08-16 LAB — CBC WITH DIFFERENTIAL/PLATELET
Abs Immature Granulocytes: 0.04 10*3/uL (ref 0.00–0.07)
Basophils Absolute: 0 10*3/uL (ref 0.0–0.1)
Basophils Relative: 0 %
Eosinophils Absolute: 0.2 10*3/uL (ref 0.0–0.5)
Eosinophils Relative: 3 %
HCT: 29.2 % — ABNORMAL LOW (ref 36.0–46.0)
Hemoglobin: 9.1 g/dL — ABNORMAL LOW (ref 12.0–15.0)
Immature Granulocytes: 1 %
Lymphocytes Relative: 15 %
Lymphs Abs: 1.2 10*3/uL (ref 0.7–4.0)
MCH: 29.4 pg (ref 26.0–34.0)
MCHC: 31.2 g/dL (ref 30.0–36.0)
MCV: 94.5 fL (ref 80.0–100.0)
Monocytes Absolute: 0.8 10*3/uL (ref 0.1–1.0)
Monocytes Relative: 10 %
Neutro Abs: 5.9 10*3/uL (ref 1.7–7.7)
Neutrophils Relative %: 71 %
Platelets: 197 10*3/uL (ref 150–400)
RBC: 3.09 MIL/uL — ABNORMAL LOW (ref 3.87–5.11)
RDW: 13.2 % (ref 11.5–15.5)
WBC: 8.3 10*3/uL (ref 4.0–10.5)
nRBC: 0 % (ref 0.0–0.2)

## 2021-08-16 LAB — BASIC METABOLIC PANEL
Anion gap: 4 — ABNORMAL LOW (ref 5–15)
BUN: 60 mg/dL — ABNORMAL HIGH (ref 8–23)
CO2: 27 mmol/L (ref 22–32)
Calcium: 8.8 mg/dL — ABNORMAL LOW (ref 8.9–10.3)
Chloride: 113 mmol/L — ABNORMAL HIGH (ref 98–111)
Creatinine, Ser: 2.79 mg/dL — ABNORMAL HIGH (ref 0.44–1.00)
GFR, Estimated: 17 mL/min — ABNORMAL LOW (ref >60)
Glucose, Bld: 105 mg/dL — ABNORMAL HIGH (ref 70–99)
Potassium: 4 mmol/L (ref 3.5–5.1)
Sodium: 144 mmol/L (ref 135–145)

## 2021-08-16 LAB — MAGNESIUM: Magnesium: 1.8 mg/dL (ref 1.7–2.4)

## 2021-08-16 MED ORDER — MIRTAZAPINE 7.5 MG PO TABS: 7.5000 mg | ORAL_TABLET | Freq: Every day | ORAL | 0 refills | Status: AC

## 2021-08-16 MED ORDER — SENNOSIDES-DOCUSATE SODIUM 8.6-50 MG PO TABS: 1.0000 | ORAL_TABLET | Freq: Every evening | ORAL | 0 refills | Status: DC | PRN

## 2021-08-16 MED ORDER — DEXAMETHASONE 4 MG PO TABS: 4.0000 mg | ORAL_TABLET | Freq: Every day | ORAL | 0 refills | Status: DC

## 2021-08-16 NOTE — TOC Transition Note (Signed)
Transition of Care [facility]) - CM/SW Discharge Note   Patient Details  Name: Kaitlin Wagner MRN: [id number] Date of Birth: 10/22/1945  [facility]) CM/SW Contact:  Candie Chroman, LCSW Phone Number: 08/16/2021, 9:57 AM   Clinical Narrative: Patient has orders to discharge home today. Neill Loft, RN at [facility] is aware and will call RN to coordinate time for pickup. No further concerns. CSW signing off.    Final next level of care: Home/Self Care ([facility]) Barriers to Discharge: Barriers Resolved   Patient Goals and CMS Choice Patient states their goals for this hospitalization and ongoing recovery are:: Patient not fully oriented.      Discharge Placement                Patient to be transferred to facility by: [facility] will pick her up   Patient and family notified of of transfer: 08/16/21  Discharge Plan and Services                DME Arranged: Other see comment [facility]) DME Agency: Other - Comment ([facility]) Date DME Agency Contacted: 08/16/21   Representative spoke with at DME Agency: Neill Loft, RN            Social Determinants of Health (SDOH) Interventions     Readmission Risk Interventions No flowsheet data found.

## 2021-08-16 NOTE — Discharge Summary (Signed)
Physician Discharge Summary  Annajulia Lewing HQI:[id number] DOB: [birth year] DOA: 07/31/2021  PCP: Marnee Guarneri, MD  Admit date: 07/31/2021 Discharge date: 08/16/2021  Admitted From: [facility] Disposition:  [facility]  Recommendations for Outpatient Follow-up:  Follow up with PCP in 1-2 weeks Please obtain BMP/CBC in one week your next doctors visit.  Started on dexamethasone, Remeron and bowel regimen as needed.  Lexapro discontinued  Discharge Condition: Stable CODE STATUS: DNR Diet recommendation: Renal diet  Brief/Interim Summary: 76 year old female with history of dementia, GERD, depression, stage II right buttock pressure injury, severe protein calorie malnutrition, thrombocytopenia comes to the hospital for evaluation of progressive weakness and poor oral intake.  Upon admission she was found to be in acute kidney failure with creatinine of 13.  Baseline creatinine 1.6.  Nephrology team was consulted and patient was started on IV fluids.  Slowly her renal function has been improving.  She continues to have very poor oral intake and needs significant amount of encouragement with family. Eventually patient was tolerating orals, renal function improved and she was cleared for discharge.  Nephrology team will coordinate with the providers at [facility] to continue to follow her renal function.  I have updated patient's daughter and answered all the questions.   Assessment & Plan:   Acute renal failure, oligoanuric Metabolic acidosis, resolved Uremia, improving - Baseline creatinine 1.6, admission creatinine 13.  Cr is trending down, on dc its 2.79. Nephro will continue to communicate with the providers at [facility]. -Renal ultrasound-negative -CT abdomen pelvis that showed bilateral renal stones without any obstruction. -Foley catheter removed 2/7   Mild hyperkalemia - Resolved   Leukocytosis - Resolved   Severe protein calorie malnutrition - Seen by dietitian and palliative care team.  Encourage  oral intake. - On Remeron and Decadron 4 mg daily   Bilateral renal stones without any obstruction - Encourage oral intake   Depression - On Remeron at bedtime.  Changed from Lexapro   Advanced dementia - Supportive care.   GERD - PPI   Essential hypertension - Does not appear to be on any medications at home.  Closely monitor outpatient   History of hyperlipidemia - No longer on statin   Stage II pressure ulcer on the buttock, right.  Present on admission - Supportive care    Body mass index is 15.51 kg/m.  Pressure Injury 10/20/20 Sacrum Posterior;Mid Stage 3 -  Full thickness tissue loss. Subcutaneous fat may be visible but bone, tendon or muscle are NOT exposed. reddened (Active)  10/20/20 1800  Location: Sacrum  Location Orientation: Posterior;Mid  Staging: Stage 3 -  Full thickness tissue loss. Subcutaneous fat may be visible but bone, tendon or muscle are NOT exposed.  Wound Description (Comments): reddened  Present on Admission: Yes     Pressure Injury 07/31/21 Buttocks Mid Stage 2 -  Partial thickness loss of dermis presenting as a shallow open injury with a red, pink wound bed without slough. (Active)  07/31/21 2300  Location: Buttocks  Location Orientation: Mid  Staging: Stage 2 -  Partial thickness loss of dermis presenting as a shallow open injury with a red, pink wound bed without slough.  Wound Description (Comments):   Present on Admission: Yes     Pressure Injury 08/13/21 Ankle Posterior;Right Stage 1 -  Intact skin with non-blanchable redness of a localized area usually over a bony prominence. (Active)  08/13/21 2345  Location: Ankle  Location Orientation: Posterior;Right  Staging: Stage 1 -  Intact skin with non-blanchable redness  of a localized area usually over a bony prominence.  Wound Description (Comments):   Present on Admission:         Discharge Diagnoses:  Principal Problem:   Acute renal failure (ARF) ([place]) Active Problems:    Advanced dementia   GERD (gastroesophageal reflux disease)   Pressure injury of right buttock, stage 2 (HCC)   Protein-calorie malnutrition, severe   Uremia   Thrombocytopenia ([place])   Aortic atherosclerosis ([place])      Consultations: Nephrology  Subjective: Patient feels well, no complaints.  She is ready to be discharged from the hospital.  I also spoke with the patient's daughter on the day of discharge and all the questions have been answered by me.  Discharge Exam: Vitals:   08/16/21 0616 08/16/21 0800  BP: (!) 146/76 135/73  Pulse: 83 91  Resp: 20 18  Temp: 97.7 F (36.5 C) (!) 97.1 F (36.2 C)  SpO2: 99% 90%   Vitals:   08/15/21 2100 08/16/21 0616 08/16/21 0643 08/16/21 0800  BP: 126/70 (!) 146/76  135/73  Pulse: 76 83  91  Resp: 18 20  18   Temp: 97.7 F (36.5 C) 97.7 F (36.5 C)  (!) 97.1 F (36.2 C)  TempSrc: Oral   Oral  SpO2: 100% 99%  90%  Weight:   43.6 kg   Height:        General: Pt is alert, awake, not in acute distress Cardiovascular: RRR, S1/S2 +, no rubs, no gallops Respiratory: CTA bilaterally, no wheezing, no rhonchi Abdominal: Soft, NT, ND, bowel sounds + Extremities: no edema, no cyanosis  Discharge Instructions   Allergies as of 08/16/2021   No Known Allergies      Medication List     STOP taking these medications    escitalopram 5 MG tablet Commonly known as: LEXAPRO       TAKE these medications    Acetaminophen Childrens 160 MG/5ML Soln Take 20 mLs by mouth 3 (three) times daily as needed for pain.   aspirin EC 81 MG tablet Take 81 mg by mouth daily.   chlorhexidine 0.12 % solution Commonly known as: PERIDEX Use as directed 15 mLs in the mouth or throat daily.   Cholecalciferol 25 MCG (1000 UT) tablet Take 1,000 Units by mouth daily.   dexamethasone 4 MG tablet Commonly known as: DECADRON Take 1 tablet (4 mg total) by mouth daily.   esomeprazole 10 MG packet Commonly known as: NEXIUM Take 10 mg by mouth  daily before breakfast.   feeding supplement Liqd Take 237 mLs by mouth 2 (two) times daily between meals.   mirtazapine 7.5 MG tablet Commonly known as: REMERON Take 1 tablet (7.5 mg total) by mouth at bedtime.   PrePLUS 27-1 MG Tabs Take 1 tablet by mouth daily.   senna-docusate 8.6-50 MG tablet Commonly known as: Senokot-S Take 1 tablet by mouth at bedtime as needed for moderate constipation.        No Known Allergies  You were cared for by a hospitalist during your hospital stay. If you have any questions about your discharge medications or the care you received while you were in the hospital after you are discharged, you can call the unit and asked to speak with the hospitalist on call if the hospitalist that took care of you is not available. Once you are discharged, your primary care physician will handle any further medical issues. Please note that no refills for any discharge medications will be authorized once you are  discharged, as it is imperative that you return to your primary care physician (or establish a relationship with a primary care physician if you do not have one) for your aftercare needs so that they can reassess your need for medications and monitor your lab values.   Procedures/Studies: CT ABDOMEN PELVIS WO CONTRAST  Result Date: 07/31/2021 CLINICAL DATA:  Acute renal failure. EXAM: CT ABDOMEN AND PELVIS WITHOUT CONTRAST TECHNIQUE: Multidetector CT imaging of the abdomen and pelvis was performed following the standard protocol without IV contrast. RADIATION DOSE REDUCTION: This exam was performed according to the departmental dose-optimization program which includes automated exposure control, adjustment of the mA and/or kV according to patient size and/or use of iterative reconstruction technique. COMPARISON:  10/20/2020 FINDINGS: Lower chest: Motion artifact at the lung bases. No large pleural effusion. Hepatobiliary: Cholecystectomy.  Normal appearance of the  liver. Pancreas: Unremarkable. No pancreatic ductal dilatation or surrounding inflammatory changes. Spleen: Normal in size without focal abnormality. Adrenals/Urinary Tract: Normal adrenal glands. 8 mm stone in left kidney lower pole without hydronephrosis. Normal appearance of the urinary bladder with small to moderate amount of fluid. 2 mm stone in the right kidney lower pole without hydronephrosis. Motion artifact throughout the abdomen. Stomach/Bowel: Again noted are at least 2 areas with bowel anastomosis. Dilated gas-filled loops of bowel throughout the anterior aspect of the abdomen. Findings are similar to the exam from 10/20/2020. No focal bowel inflammation. Vascular/Lymphatic: Diffuse atherosclerotic calcifications in the aorta and iliac arteries. Negative for abdominal aortic aneurysm. No significant lymph node enlargement in the abdomen or pelvis. Lymph node along the right external iliac nodal chain on sequence 2 image 61 measures 8 mm in the short axis and stable. Reproductive: Uterus and bilateral adnexa are unremarkable. Other: Again noted is laxity along the anterior abdominal wall with minimal subcutaneous fat along the anterior abdominal wall. Findings along the anterior abdominal wall are likely postoperative in etiology. Negative for ascites. Negative for free air. Musculoskeletal: Extensive heterogeneity in the lumbar spine is similar to the previous examination. Partial healing of the sacral decubitus ulcer. There continues to be a small ulceration in this area. IMPRESSION: 1. Bilateral renal calculi without hydronephrosis. 2. Study has limitations from excessive motion artifact. 3. Gas-filled dilated loops of bowel throughout the abdomen. Postoperative bowel changes. Bowel gas distension is nonspecific but could represent an ileus. Limited evaluation due to extensive motion artifact on this examination. 4. Sacral decubitus ulceration has partially healed since 10/20/2020. 5.  Aortic  Atherosclerosis (ICD10-I70.0). Electronically Signed   By: Markus Daft M.D.   On: 07/31/2021 13:15   DG Abd 1 View  Result Date: 08/15/2021 CLINICAL DATA:  Abdominal distension EXAM: ABDOMEN - 1 VIEW COMPARISON:  CT abdomen/pelvis dated 07/31/2021 FINDINGS: Mildly prominent loops of bowel in the central abdomen favor colon, suggesting adynamic ileus. Suture lines in the left mid abdomen. Degenerative changes of the lumbar spine. Cholecystectomy clips. IMPRESSION: Negative. Electronically Signed   By: Julian Hy M.D.   On: 08/15/2021 23:14   US RENAL  Result Date: 08/01/2021 CLINICAL DATA:  Acute renal failure EXAM: RENAL / URINARY TRACT ULTRASOUND COMPLETE COMPARISON:  CT abdomen 07/31/2021 FINDINGS: Right Kidney: Renal measurements: 10.4 x 4.7 x 4.2 cm = volume: 108 solid mL. Echogenicity within normal limits. No solid mass or hydronephrosis visualized. 1.2 x 1.5 x 1.8 cm right renal cyst along the upper pole. Left Kidney: Renal measurements: 11.4 x 4.6 x 4.8 cm = volume: 132 mL. Echogenicity within normal limits. No mass or  hydronephrosis visualized. Bladder: Bladder is unremarkable with a Foley catheter present. Other: None. IMPRESSION: 1. No obstructive uropathy. Electronically Signed   By: Kathreen Devoid M.D.   On: 08/01/2021 10:33     The results of significant diagnostics from this hospitalization (including imaging, microbiology, ancillary and laboratory) are listed below for reference.     Microbiology: No results found for this or any previous visit (from the past 240 hour(s)).   Labs: BNP (last 3 results) No results for input(s): BNP in the last 8760 hours. Basic Metabolic Panel: Recent Labs  Lab 08/10/21 0507 08/11/21 0428 08/12/21 0318 08/13/21 0443 08/14/21 0921 08/15/21 0841 08/16/21 0526  NA 143 143 140 136 140 145 144  K 3.9 4.0 3.8 5.4* 5.4* 4.3 4.0  CL 104 106 105 104 109 112* 113*  CO2 30 28 27 23 24 25 27   GLUCOSE 97 98 127* 74 76 76 105*  BUN 88* 83* 76*  70* 59* 57* 60*  CREATININE 5.35* 4.68* 3.96* 3.47* 2.90* 2.85* 2.79*  CALCIUM 8.7* 8.4* 8.3* 8.2* 8.3* 8.9 8.8*  MG 1.7 1.7  --   --   --   --  1.8  PHOS 4.5 4.4  --   --   --   --   --    Liver Function Tests: No results for input(s): AST, ALT, ALKPHOS, BILITOT, PROT, ALBUMIN in the last 168 hours. No results for input(s): LIPASE, AMYLASE in the last 168 hours. No results for input(s): AMMONIA in the last 168 hours. CBC: Recent Labs  Lab 08/12/21 0318 08/13/21 0443 08/14/21 0921 08/15/21 0513 08/16/21 0526  WBC 11.4* 9.7 9.4 9.5 8.3  NEUTROABS 9.6* 7.6 6.6 6.1 5.9  HGB 9.0* 9.8* 9.3* 9.9* 9.1*  HCT 29.0* 31.0* 29.4* 32.1* 29.2*  MCV 96.0 94.5 93.3 95.0 94.5  PLT 163 178 191 208 197   Cardiac Enzymes: No results for input(s): CKTOTAL, CKMB, CKMBINDEX, TROPONINI in the last 168 hours. BNP: Invalid input(s): POCBNP CBG: Recent Labs  Lab 08/11/21 2126  GLUCAP 130*   D-Dimer No results for input(s): DDIMER in the last 72 hours. Hgb A1c No results for input(s): HGBA1C in the last 72 hours. Lipid Profile No results for input(s): CHOL, HDL, LDLCALC, TRIG, CHOLHDL, LDLDIRECT in the last 72 hours. Thyroid function studies No results for input(s): TSH, T4TOTAL, T3FREE, THYROIDAB in the last 72 hours.  Invalid input(s): FREET3 Anemia work up No results for input(s): VITAMINB12, FOLATE, FERRITIN, TIBC, IRON, RETICCTPCT in the last 72 hours. Urinalysis    Component Value Date/Time   COLORURINE YELLOW 07/31/2021 1352   APPEARANCEUR CLEAR 07/31/2021 1352   LABSPEC 1.010 07/31/2021 1352   PHURINE 5.0 07/31/2021 1352   GLUCOSEU NEGATIVE 07/31/2021 1352   HGBUR TRACE (A) 07/31/2021 1352   BILIRUBINUR NEGATIVE 07/31/2021 1352   KETONESUR NEGATIVE 07/31/2021 1352   PROTEINUR NEGATIVE 07/31/2021 1352   NITRITE NEGATIVE 07/31/2021 1352   LEUKOCYTESUR SMALL (A) 07/31/2021 1352   Sepsis Labs Invalid input(s): PROCALCITONIN,  WBC,  LACTICIDVEN Microbiology No results found  for this or any previous visit (from the past 240 hour(s)).   Time coordinating discharge:  I have spent 35 minutes face to face with the patient and on the ward discussing the patients care, assessment, plan and disposition with other care givers. >50% of the time was devoted counseling the patient about the risks and benefits of treatment/Discharge disposition and coordinating care.   SIGNED:   Damita Lack, MD  [facility] 08/16/2021, 12:25 PM  If 7PM-7AM, please contact night-coverage

## 2021-08-16 NOTE — Progress Notes (Addendum)
[facility]  ROUNDING NOTE   Subjective:   Patient resting quietly watching TV No family at bedside Awaiting staff to assist with breakfast Denies shortness of breath  Creatinine 2.79 Recorded urine output of 1.1 L in preceding 24 hours   Objective:  Vital signs in last 24 hours:  Temp:  [97.1 F (36.2 C)-97.7 F (36.5 C)] 97.1 F (36.2 C) (02/16 0800) Pulse Rate:  [74-91] 91 (02/16 0800) Resp:  [18-20] 18 (02/16 0800) BP: (126-146)/(67-76) 135/73 (02/16 0800) SpO2:  [90 %-100 %] 90 % (02/16 0800) Weight:  [43.6 kg] 43.6 kg (02/16 0643)  Weight change: 1.4 kg Filed Weights   08/14/21 0500 08/15/21 0500 08/16/21 0643  Weight: 44.6 kg 42.2 kg 43.6 kg    Intake/Output: I/O last 3 completed shifts: In: 0923 [P.O.:1257] Out: 1850 [Urine:1850]   Intake/Output this shift:  Total I/O In: -  Out: 50 [Urine:50]  Physical Exam: General: NAD, resting  Head: Normocephalic, atraumatic. Moist oral mucosal membranes  Eyes: Anicteric  Lungs:  Clear to auscultation, normal effort  Heart: Regular rate and rhythm  Abdomen:  Soft, nontender  Extremities:  no peripheral edema.  Neurologic: Nonfocal, moving all four extremities  Skin: No lesions       Basic Metabolic Panel: Recent Labs  Lab 08/10/21 0507 08/11/21 0428 08/12/21 0318 08/13/21 0443 08/14/21 0921 08/15/21 0841 08/16/21 0526  NA 143 143 140 136 140 145 144  K 3.9 4.0 3.8 5.4* 5.4* 4.3 4.0  CL 104 106 105 104 109 112* 113*  CO2 30 28 27 23 24 25 27   GLUCOSE 97 98 127* 74 76 76 105*  BUN 88* 83* 76* 70* 59* 57* 60*  CREATININE 5.35* 4.68* 3.96* 3.47* 2.90* 2.85* 2.79*  CALCIUM 8.7* 8.4* 8.3* 8.2* 8.3* 8.9 8.8*  MG 1.7 1.7  --   --   --   --  1.8  PHOS 4.5 4.4  --   --   --   --   --      Liver Function Tests: No results for input(s): AST, ALT, ALKPHOS, BILITOT, PROT, ALBUMIN in the last 168 hours.  No results for input(s): LIPASE, AMYLASE in the last 168 hours. No results for  input(s): AMMONIA in the last 168 hours.  CBC: Recent Labs  Lab 08/12/21 0318 08/13/21 0443 08/14/21 0921 08/15/21 0513 08/16/21 0526  WBC 11.4* 9.7 9.4 9.5 8.3  NEUTROABS 9.6* 7.6 6.6 6.1 5.9  HGB 9.0* 9.8* 9.3* 9.9* 9.1*  HCT 29.0* 31.0* 29.4* 32.1* 29.2*  MCV 96.0 94.5 93.3 95.0 94.5  PLT 163 178 191 208 197     Cardiac Enzymes: No results for input(s): CKTOTAL, CKMB, CKMBINDEX, TROPONINI in the last 168 hours.  BNP: Invalid input(s): POCBNP  CBG: Recent Labs  Lab 08/11/21 2126  GLUCAP 130*     Microbiology: Results for orders placed or performed during the hospital encounter of 07/31/21  Resp Panel by RT-PCR (Flu A&B, Covid) Nasopharyngeal Swab     Status: None   Collection Time: 07/31/21  3:01 PM   Specimen: Nasopharyngeal Swab; Nasopharyngeal(NP) swabs in vial transport medium  Result Value Ref Range Status   SARS Coronavirus 2 by RT PCR NEGATIVE NEGATIVE Final    Comment: (NOTE) SARS-CoV-2 target nucleic acids are NOT DETECTED.  The SARS-CoV-2 RNA is generally detectable in upper respiratory specimens during the acute phase of infection. The lowest concentration of SARS-CoV-2 viral copies this assay can detect is 138 copies/mL. A negative result does not  preclude SARS-Cov-2 infection and should not be used as the sole basis for treatment or other patient management decisions. A negative result may occur with  improper specimen collection/handling, submission of specimen other than nasopharyngeal swab, presence of viral mutation(s) within the areas targeted by this assay, and inadequate number of viral copies(<138 copies/mL). A negative result must be combined with clinical observations, patient history, and epidemiological information. The expected result is Negative.  Fact Sheet for Patients:  EntrepreneurPulse.com.au  Fact Sheet for Healthcare Providers:  IncredibleEmployment.be  This test is no t yet approved  or cleared by the [place] FDA and  has been authorized for detection and/or diagnosis of SARS-CoV-2 by FDA under an Emergency Use Authorization (EUA). This EUA will remain  in effect (meaning this test can be used) for the duration of the COVID-19 declaration under Section 564(b)(1) of the Act, 21 U.S.C.section 360bbb-3(b)(1), unless the authorization is terminated  or revoked sooner.       Influenza A by PCR NEGATIVE NEGATIVE Final   Influenza B by PCR NEGATIVE NEGATIVE Final    Comment: (NOTE) The Xpert Xpress SARS-CoV-2/FLU/RSV plus assay is intended as an aid in the diagnosis of influenza from Nasopharyngeal swab specimens and should not be used as a sole basis for treatment. Nasal washings and aspirates are unacceptable for Xpert Xpress SARS-CoV-2/FLU/RSV testing.  Fact Sheet for Patients: EntrepreneurPulse.com.au  Fact Sheet for Healthcare Providers: IncredibleEmployment.be  This test is not yet approved or cleared by the [place] FDA and has been authorized for detection and/or diagnosis of SARS-CoV-2 by FDA under an Emergency Use Authorization (EUA). This EUA will remain in effect (meaning this test can be used) for the duration of the COVID-19 declaration under Section 564(b)(1) of the Act, 21 U.S.C. section 360bbb-3(b)(1), unless the authorization is terminated or revoked.  Performed at [facility], [address] [place], [place] [address]     Coagulation Studies: No results for input(s): LABPROT, INR in the last 72 hours.   Urinalysis: No results for input(s): COLORURINE, LABSPEC, PHURINE, GLUCOSEU, HGBUR, BILIRUBINUR, KETONESUR, PROTEINUR, UROBILINOGEN, NITRITE, LEUKOCYTESUR in the last 72 hours.  Invalid input(s): APPERANCEUR     Imaging: DG Abd 1 View  Result Date: 08/15/2021 CLINICAL DATA:  Abdominal distension EXAM: ABDOMEN - 1 VIEW COMPARISON:  CT abdomen/pelvis dated 07/31/2021 FINDINGS:  Mildly prominent loops of bowel in the central abdomen favor colon, suggesting adynamic ileus. Suture lines in the left mid abdomen. Degenerative changes of the lumbar spine. Cholecystectomy clips. IMPRESSION: Negative. Electronically Signed   By: Julian Hy M.D.   On: 08/15/2021 23:14     Medications:      dexamethasone  4 mg Oral Daily   feeding supplement  237 mL Oral TID BM   heparin injection (subcutaneous)  5,000 Units Subcutaneous Q8H   mirtazapine  7.5 mg Oral QHS   multivitamin with minerals  1 tablet Oral Daily   pantoprazole  20 mg Oral Daily   sodium zirconium cyclosilicate  10 g Oral Once   acetaminophen **OR** acetaminophen, guaiFENesin, hydrALAZINE, ipratropium-albuterol, metoprolol tartrate, senna-docusate, traZODone  Assessment/ Plan:  Ms. Tasheba Henson is a 76 y.o.  female  with a past medical history of GERD, hypertension, diabetes, hyperlipidemia [facility] patient, who was [facility] to [facility] on 07/31/2021 for Acute renal failure (ARF) ([place]) [N17.9] AKI (acute kidney injury) ([place]) [N17.9]   Acute Kidney Injury with hyperkalemia on chronic kidney disease stage IIIb with baseline creatinine 1.5 and GFR of 33 on 014/26/22.  Acute  kidney injury secondary to dehydration, patient appears hypovolemic Chronic kidney disease risk factors include diabetes and hypertension. Renal ultrasound negative for obstruction No IV contrast exposure.  No indication for dialysis at this time.   -Renal function continues to slowly improve -IV fluids continue due to poor oral intake - Potassium 4.0.  -Cleared to discharge from renal stance.  We will follow-up with [facility] providers.  Lab Results  Component Value Date   CREATININE 2.79 (H) 08/16/2021   CREATININE 2.85 (H) 08/15/2021   CREATININE 2.90 (H) 08/14/2021    Intake/Output Summary (Last 24 hours) at 08/16/2021 1146 Last data filed at 08/16/2021 0900 Gross per 24 hour  Intake 720 ml  Output 1150 ml  Net -430 ml    2.  Hypernatremia   -Monitoring sodium levels.  Expect this to normalize with adequate oral intake.    3.  Anemia of chronic kidney disease Hemoglobin 9.1 No indication for ESA  4. 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    5.  Metabolic acidosis.  Resolved   LOS: [place], NP 2/16/202311:46 AM

## 2022-04-27 IMAGING — CT CT ABD-PELV W/O CM
2 of 4 series · 15 of 46 positions shown, 17 images · non-contrast
Comparison: Most recent CT 07/30/2011

CLINICAL DATA: Abdominal distension. [HOSPITAL] patient with
dementia and sacral decubitus wound.

EXAM:
CT ABDOMEN AND PELVIS WITHOUT CONTRAST
TECHNIQUE: Multidetector CT imaging of the abdomen and pelvis was performed
following the standard protocol without IV contrast.

[Series 2: routine abd/pel wo · axial · 0.98mm/px · z∈[-727,-302]mm · 12 of 93 slices shown, 14 images]
[im 4/93  soft-tissue]
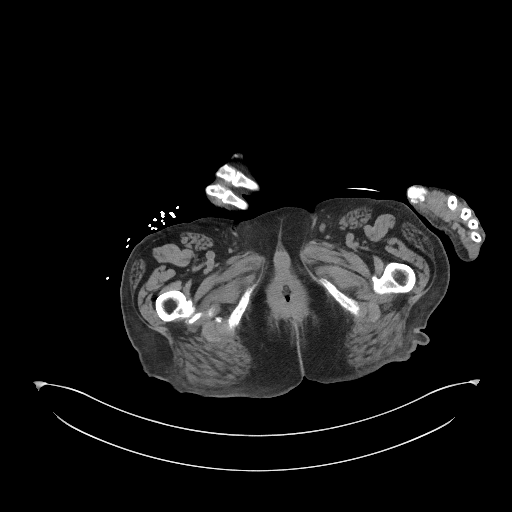
[im 4/93  bone]
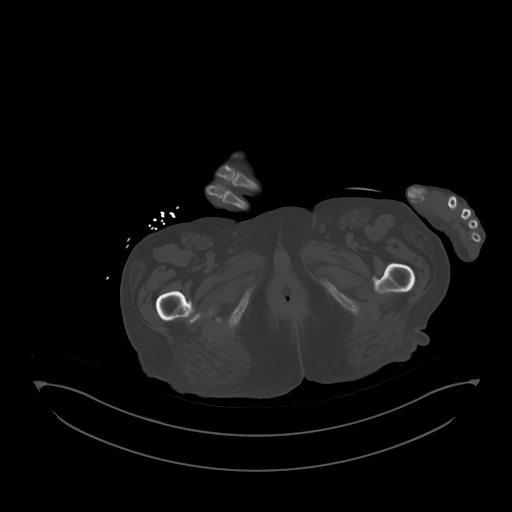
[im 12/93  soft-tissue]
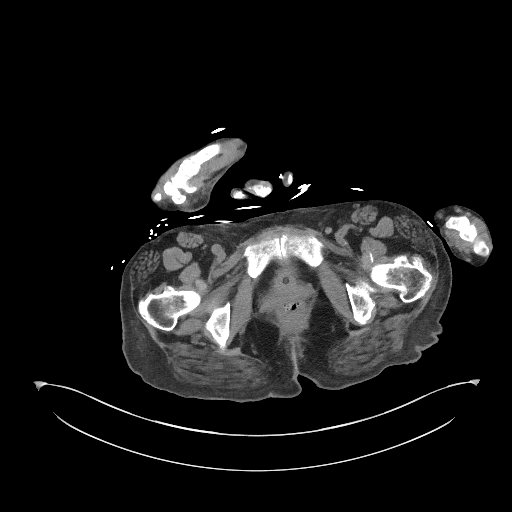
[im 20/93  soft-tissue]
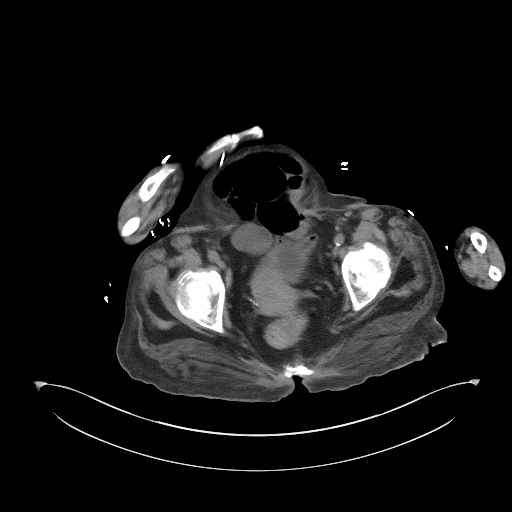
[im 27/93  soft-tissue]
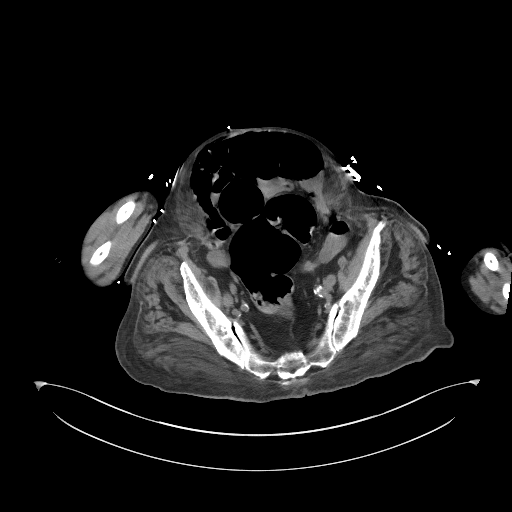
[im 35/93  soft-tissue]
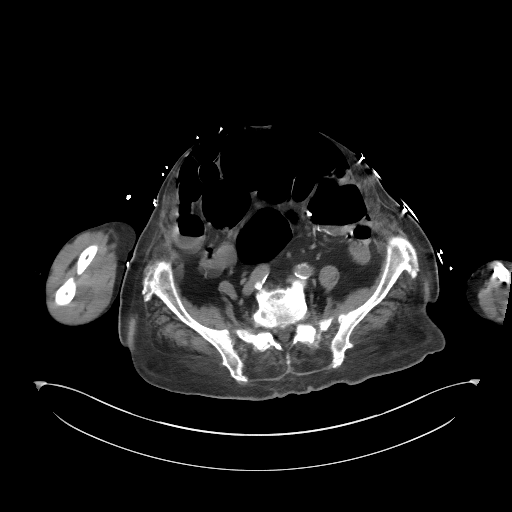
[im 43/93  soft-tissue]
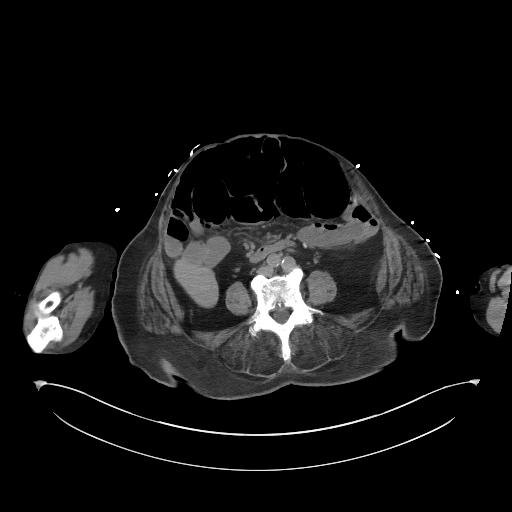
[im 50/93  soft-tissue]
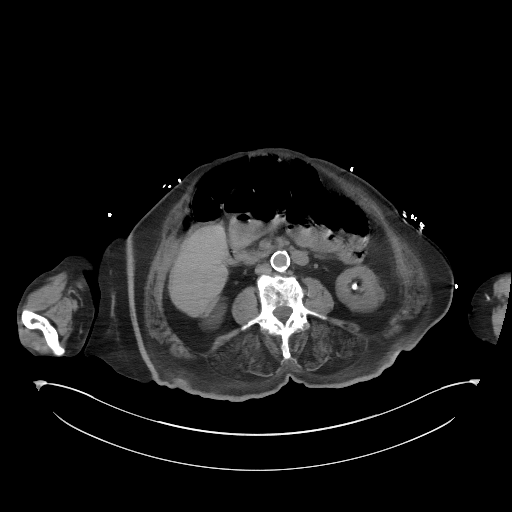
[im 58/93  soft-tissue]
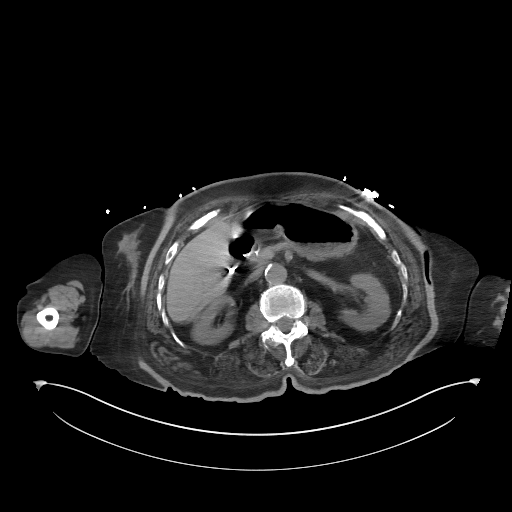
[im 66/93  soft-tissue]
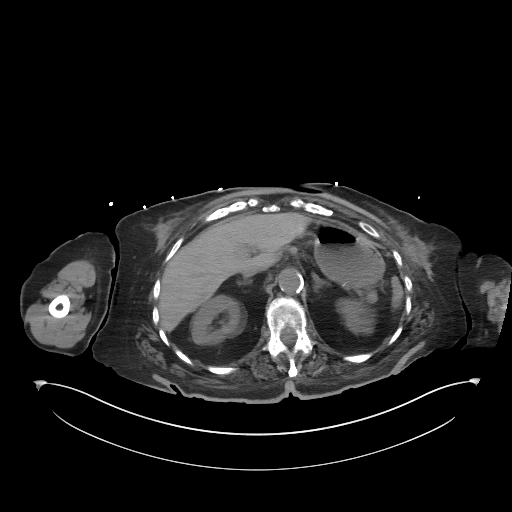
[im 66/93  bone]
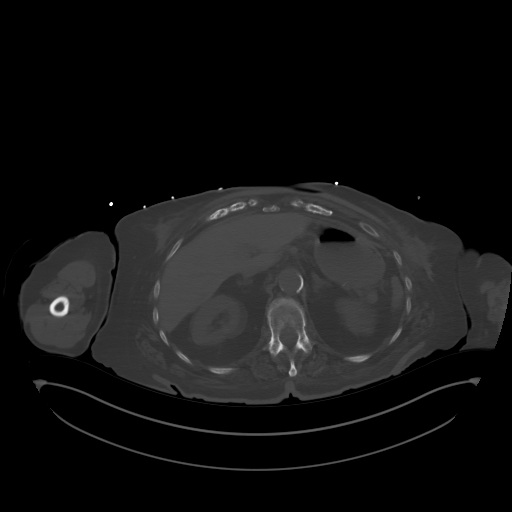
[im 73/93  soft-tissue]
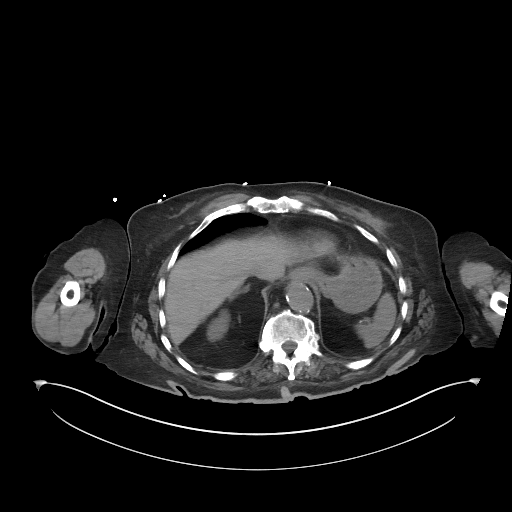
[im 81/93  soft-tissue]
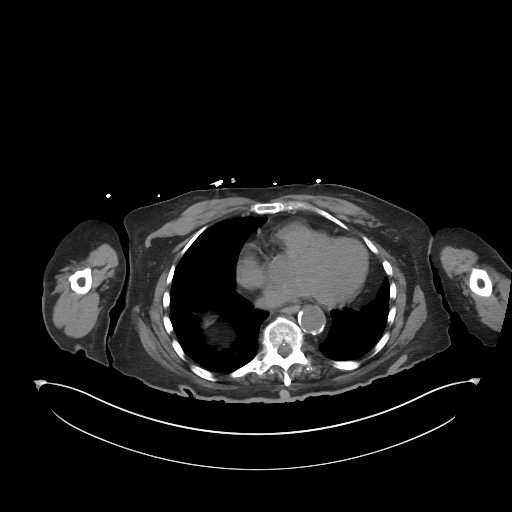
[im 89/93  soft-tissue]
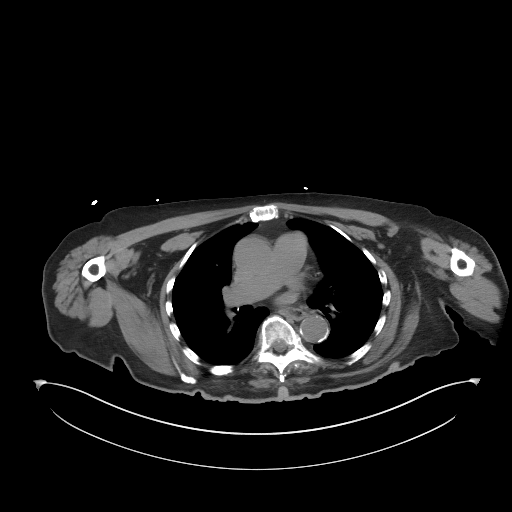

[Series 5: coronal st · coronal · 0.86mm/px · 3 of 95 slices shown]
[im 32/95  soft-tissue]
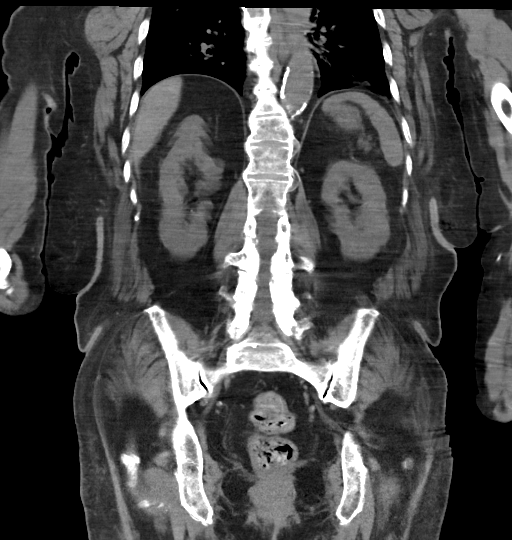
[im 42/95  soft-tissue]
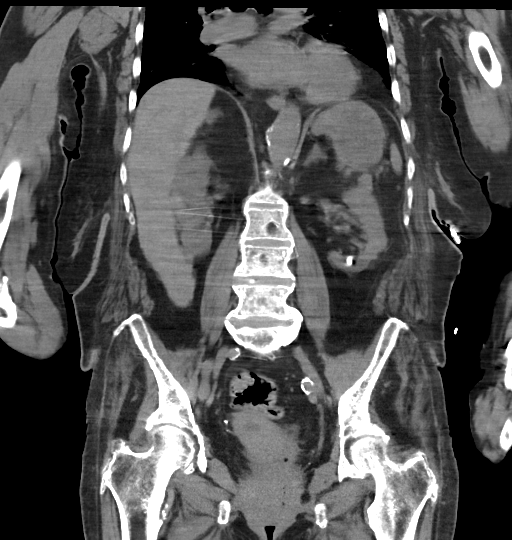
[im 53/95  soft-tissue]
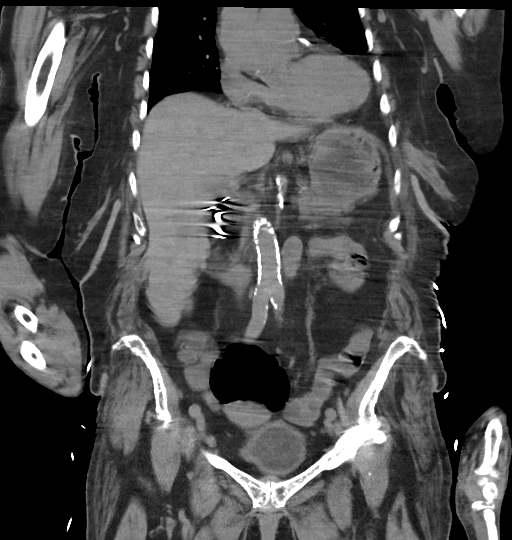

[15 of 46 positions shown; findings below may reference images not displayed]

FINDINGS: Lower chest: Mild cardiomegaly. Coronary artery calcifications.
Atherosclerosis and tortuosity of the included thoracic aorta.
Subsegmental atelectasis in the lingula and left lower lobe. No
pleural effusion or acute airspace disease.

Hepatobiliary: Elongated right lobe of the liver. No evidence of
focal liver abnormality on this noncontrast exam. Cholecystectomy.
No obvious biliary dilatation, motion obscures detailed assessment.
There is no portal venous gas.

Pancreas: Parenchymal atrophy. No ductal dilatation or inflammation.

Spleen: Normal in size without focal abnormality.

Adrenals/Urinary Tract: No adrenal nodule. 6 mm nonobstructing stone
in the lower left kidney. 3 mm nonobstructing stone in the lower
right kidney. There is no hydronephrosis. Probable cyst in the upper
right kidney, series 2, image 26, obscured by motion. Minor
symmetric perinephric edema. No ureteral calculi. Foley catheter
decompresses the urinary bladder. Equivocal bladder wall thickening.
Small amount of air in the bladder is likely related to catheter.

Stomach/Bowel: Bowel assessment is limited in the absence of enteric
contrast as well as patient motion artifact. Diffuse gaseous
distension of small bowel loops. There are areas of small bowel
pneumatosis as well as free air in the adjacent mid abdomen,
detailed assessment obscured by motion, however for example series
2, image 45. Pneumatosis appears to involve a moderate segment of
small bowel. Occasional small air-fluid levels. Patient is post at
least partial colectomy with sigmoid colon remaining in situ, and
chain sutures noted in the left abdomen. Stool in the distal colon.

Vascular/Lymphatic: Advanced aortic and branch atherosclerosis. No
aortic aneurysm. There is no portal venous or mesenteric venous gas.
Cannot assess for small-vessel mesenteric gas given motion. No bulky
abdominopelvic adenopathy.

Reproductive: Unenhanced uterus grossly normal.  No adnexal mass.

Other: No significant ascites or free fluid. Small volume of free
air suspected in the mid abdomen adjacent to small bowel
pneumatosis, without tracking under the hemidiaphragms.

Musculoskeletal: Skin defect consistent sacral decubitus ulcer
extends to the sacrum and coccyx. There is no obvious bony
resorption or destruction. No focal fluid collection. Laxity of
anterior abdominal musculature. Bones are diffusely under
mineralized with diffuse degenerative change throughout the spine.
Gluteal and paravertebral muscle fatty atrophy.
IMPRESSION: 1. Small bowel pneumatosis and free air in the mid abdomen, detailed
assessment obscured by motion artifact. This is suspicious for bowel
ischemia. No portal venous or mesenteric venous gas.
2. Skin defect consistent with sacral decubitus ulcer extends to the
sacrum and coccyx. No obvious bony resorption or destruction.
3. Bilateral nonobstructing renal calculi.

Aortic Atherosclerosis ([id number]).

These results were called by telephone at the time of interpretation
on 10/20/2020 at [DATE] to Dr Kohner, who verbally acknowledged
these results.

## 2022-04-27 IMAGING — DX DG CHEST 1V PORT
1 series · 1 of 1 positions shown · non-contrast
Comparison: None.

CLINICAL DATA: Malaise

EXAM:
PORTABLE CHEST 1 VIEW

[chest ap]
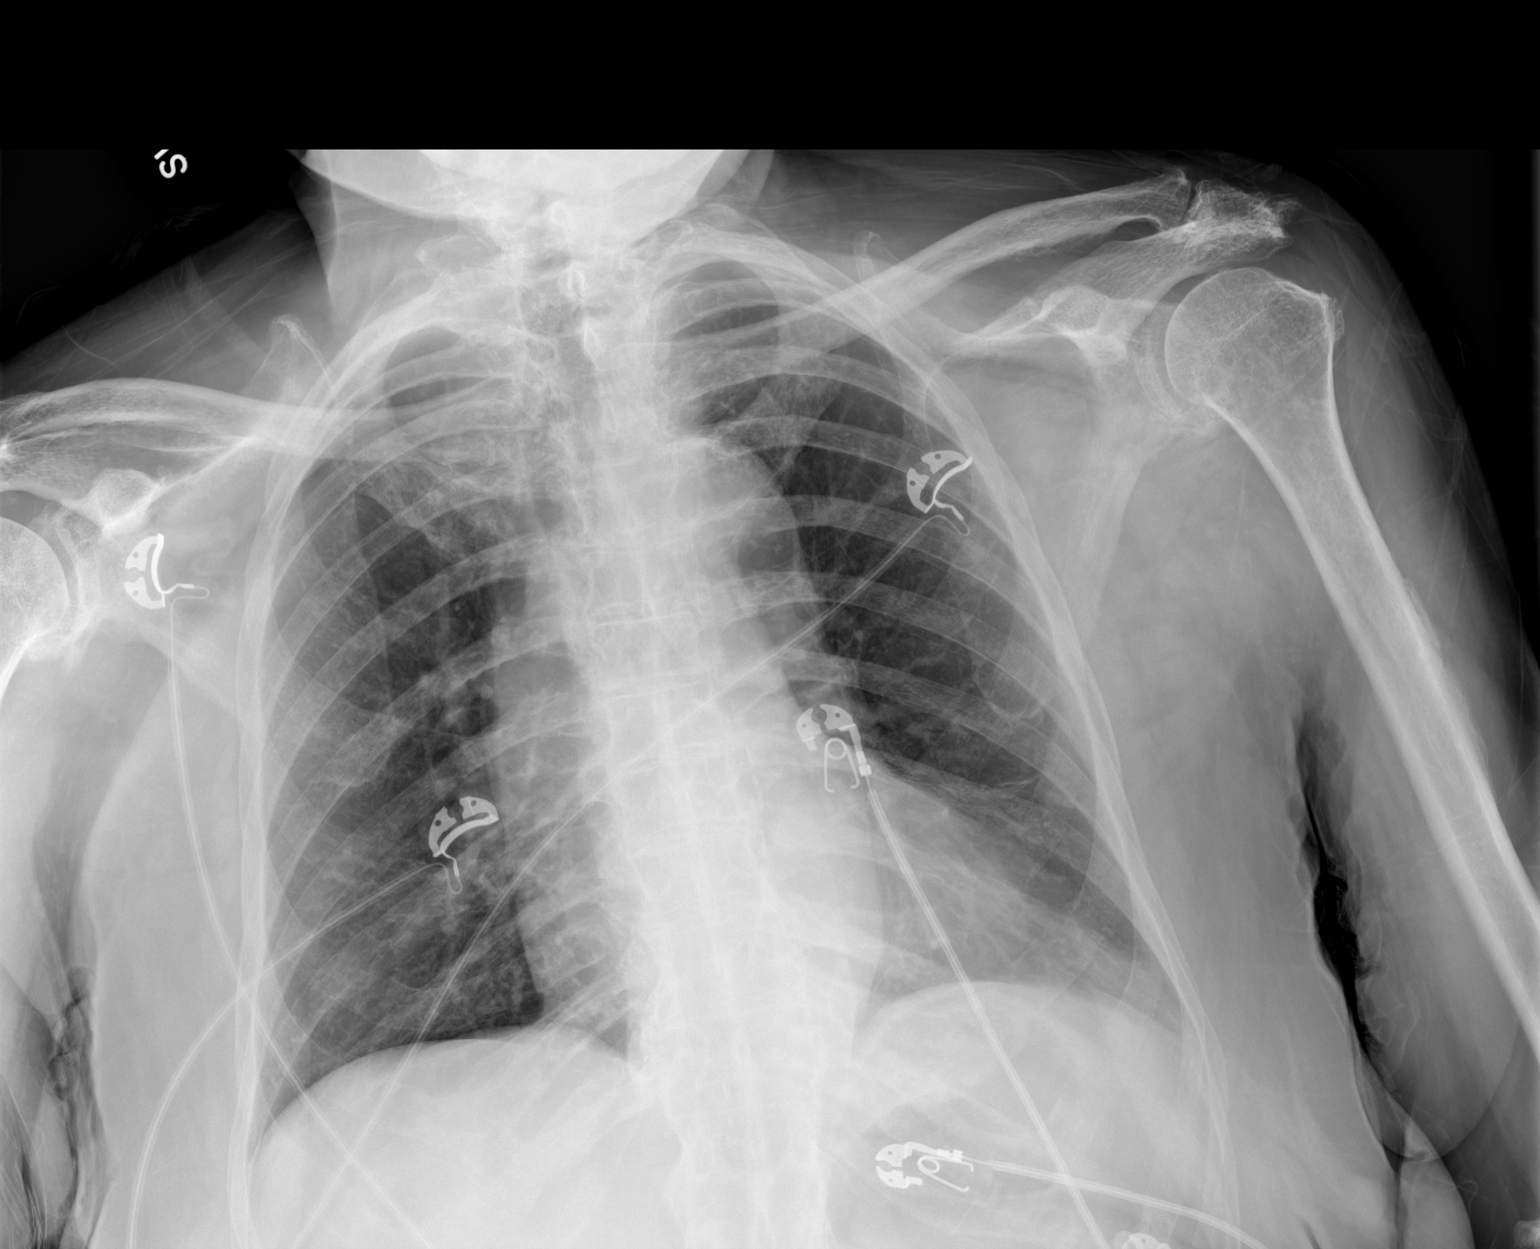

[1 of 1 positions shown; findings below may reference images not displayed]

FINDINGS: The heart size and mediastinal contours are within normal limits.
Both lungs are clear. The visualized skeletal structures are
unremarkable.
IMPRESSION: No active disease.

## 2022-11-04 ENCOUNTER — Inpatient Hospital Stay: Payer: Medicare (Managed Care)

## 2022-11-04 ENCOUNTER — Emergency Department: Payer: Medicare (Managed Care)

## 2022-11-04 ENCOUNTER — Inpatient Hospital Stay
Admission: EM | Admit: 2022-11-04 | Discharge: 2022-11-12 | DRG: 871 | Disposition: A | Discharge status: Home / Self Care | Payer: Medicare (Managed Care) | Source: Skilled Nursing Facility | Attending: Internal Medicine | Admitting: Internal Medicine

## 2022-11-04 DIAGNOSIS — N184 Chronic kidney disease, stage 4 (severe): Secondary | ICD-10-CM

## 2022-11-04 DIAGNOSIS — R197 Diarrhea, unspecified: Secondary | ICD-10-CM | POA: Diagnosis present

## 2022-11-04 DIAGNOSIS — E119 Type 2 diabetes mellitus without complications: Secondary | ICD-10-CM

## 2022-11-04 DIAGNOSIS — N1330 Unspecified hydronephrosis: Secondary | ICD-10-CM | POA: Diagnosis present

## 2022-11-04 DIAGNOSIS — A4150 Gram-negative sepsis, unspecified: Secondary | ICD-10-CM

## 2022-11-04 DIAGNOSIS — K529 Noninfective gastroenteritis and colitis, unspecified: Secondary | ICD-10-CM

## 2022-11-04 DIAGNOSIS — I872 Venous insufficiency (chronic) (peripheral): Secondary | ICD-10-CM | POA: Diagnosis present

## 2022-11-04 DIAGNOSIS — E872 Acidosis, unspecified: Secondary | ICD-10-CM

## 2022-11-04 DIAGNOSIS — N179 Acute kidney failure, unspecified: Secondary | ICD-10-CM | POA: Diagnosis present

## 2022-11-04 DIAGNOSIS — F03C0 Unspecified dementia, severe, without behavioral disturbance, psychotic disturbance, mood disturbance, and anxiety: Secondary | ICD-10-CM | POA: Diagnosis present

## 2022-11-04 DIAGNOSIS — K439 Ventral hernia without obstruction or gangrene: Secondary | ICD-10-CM | POA: Diagnosis present

## 2022-11-04 DIAGNOSIS — E876 Hypokalemia: Secondary | ICD-10-CM | POA: Diagnosis not present

## 2022-11-04 DIAGNOSIS — E1140 Type 2 diabetes mellitus with diabetic neuropathy, unspecified: Secondary | ICD-10-CM | POA: Diagnosis present

## 2022-11-04 DIAGNOSIS — G629 Polyneuropathy, unspecified: Secondary | ICD-10-CM

## 2022-11-04 DIAGNOSIS — I700 Atherosclerosis of aorta: Secondary | ICD-10-CM | POA: Diagnosis present

## 2022-11-04 DIAGNOSIS — D631 Anemia in chronic kidney disease: Secondary | ICD-10-CM | POA: Diagnosis present

## 2022-11-04 DIAGNOSIS — E43 Unspecified severe protein-calorie malnutrition: Secondary | ICD-10-CM | POA: Diagnosis present

## 2022-11-04 DIAGNOSIS — M8580 Other specified disorders of bone density and structure, unspecified site: Secondary | ICD-10-CM | POA: Diagnosis present

## 2022-11-04 DIAGNOSIS — F32A Depression, unspecified: Secondary | ICD-10-CM | POA: Diagnosis present

## 2022-11-04 DIAGNOSIS — E1122 Type 2 diabetes mellitus with diabetic chronic kidney disease: Secondary | ICD-10-CM

## 2022-11-04 DIAGNOSIS — E860 Dehydration: Secondary | ICD-10-CM | POA: Diagnosis present

## 2022-11-04 DIAGNOSIS — Z66 Do not resuscitate: Secondary | ICD-10-CM | POA: Diagnosis present

## 2022-11-04 DIAGNOSIS — Z800 Family history of malignant neoplasm of digestive organs: Secondary | ICD-10-CM

## 2022-11-04 DIAGNOSIS — Z79899 Other long term (current) drug therapy: Secondary | ICD-10-CM

## 2022-11-04 DIAGNOSIS — K219 Gastro-esophageal reflux disease without esophagitis: Secondary | ICD-10-CM | POA: Diagnosis present

## 2022-11-04 DIAGNOSIS — Z1152 Encounter for screening for COVID-19: Secondary | ICD-10-CM

## 2022-11-04 DIAGNOSIS — R319 Hematuria, unspecified: Secondary | ICD-10-CM

## 2022-11-04 DIAGNOSIS — Z85038 Personal history of other malignant neoplasm of large intestine: Secondary | ICD-10-CM | POA: Diagnosis present

## 2022-11-04 DIAGNOSIS — Z7982 Long term (current) use of aspirin: Secondary | ICD-10-CM

## 2022-11-04 DIAGNOSIS — I10 Essential (primary) hypertension: Secondary | ICD-10-CM | POA: Diagnosis present

## 2022-11-04 DIAGNOSIS — E861 Hypovolemia: Secondary | ICD-10-CM | POA: Diagnosis present

## 2022-11-04 DIAGNOSIS — Z9049 Acquired absence of other specified parts of digestive tract: Secondary | ICD-10-CM

## 2022-11-04 DIAGNOSIS — I129 Hypertensive chronic kidney disease with stage 1 through stage 4 chronic kidney disease, or unspecified chronic kidney disease: Secondary | ICD-10-CM | POA: Diagnosis present

## 2022-11-04 DIAGNOSIS — N189 Chronic kidney disease, unspecified: Secondary | ICD-10-CM

## 2022-11-04 DIAGNOSIS — N390 Urinary tract infection, site not specified: Secondary | ICD-10-CM

## 2022-11-04 DIAGNOSIS — G4730 Sleep apnea, unspecified: Secondary | ICD-10-CM | POA: Diagnosis present

## 2022-11-04 DIAGNOSIS — F0393 Unspecified dementia, unspecified severity, with mood disturbance: Secondary | ICD-10-CM | POA: Diagnosis present

## 2022-11-04 DIAGNOSIS — N200 Calculus of kidney: Secondary | ICD-10-CM | POA: Diagnosis present

## 2022-11-04 DIAGNOSIS — E785 Hyperlipidemia, unspecified: Secondary | ICD-10-CM | POA: Diagnosis present

## 2022-11-04 LAB — LACTIC ACID, PLASMA
Lactic Acid, Venous: 0.9 mmol/L (ref 0.5–1.9)
Lactic Acid, Venous: 1.9 mmol/L (ref 0.5–1.9)

## 2022-11-04 LAB — URINALYSIS, W/ REFLEX TO CULTURE (INFECTION SUSPECTED)
Bilirubin Urine: NEGATIVE
Glucose, UA: NEGATIVE mg/dL
Ketones, ur: NEGATIVE mg/dL
Nitrite: NEGATIVE
Protein, ur: 100 mg/dL — AB
RBC / HPF: >50 RBC/hpf (ref 0–5)
Specific Gravity, Urine: 1.01 (ref 1.005–1.030)
WBC, UA: >50 WBC/hpf (ref 0–5)
pH: 5 (ref 5.0–8.0)

## 2022-11-04 LAB — CBC WITH DIFFERENTIAL/PLATELET
Abs Immature Granulocytes: 0.09 10*3/uL — ABNORMAL HIGH (ref 0.00–0.07)
Basophils Absolute: 0 10*3/uL (ref 0.0–0.1)
Basophils Relative: 0 %
Eosinophils Absolute: 0 10*3/uL (ref 0.0–0.5)
Eosinophils Relative: 0 %
HCT: 43.9 % (ref 36.0–46.0)
Hemoglobin: 13.9 g/dL (ref 12.0–15.0)
Immature Granulocytes: 1 %
Lymphocytes Relative: 8 %
Lymphs Abs: 1.3 10*3/uL (ref 0.7–4.0)
MCH: 29.3 pg (ref 26.0–34.0)
MCHC: 31.7 g/dL (ref 30.0–36.0)
MCV: 92.4 fL (ref 80.0–100.0)
Monocytes Absolute: 1.2 10*3/uL — ABNORMAL HIGH (ref 0.1–1.0)
Monocytes Relative: 8 %
Neutro Abs: 13.1 10*3/uL — ABNORMAL HIGH (ref 1.7–7.7)
Neutrophils Relative %: 83 %
Platelets: 268 10*3/uL (ref 150–400)
RBC: 4.75 MIL/uL (ref 3.87–5.11)
RDW: 15.1 % (ref 11.5–15.5)
WBC: 15.8 10*3/uL — ABNORMAL HIGH (ref 4.0–10.5)
nRBC: 0 % (ref 0.0–0.2)

## 2022-11-04 LAB — COMPREHENSIVE METABOLIC PANEL
ALT: 22 U/L (ref 0–44)
AST: 22 U/L (ref 15–41)
Albumin: 3.9 g/dL (ref 3.5–5.0)
Alkaline Phosphatase: 171 U/L — ABNORMAL HIGH (ref 38–126)
Anion gap: 14 (ref 5–15)
BUN: 140 mg/dL — ABNORMAL HIGH (ref 8–23)
CO2: 16 mmol/L — ABNORMAL LOW (ref 22–32)
Calcium: 8.9 mg/dL (ref 8.9–10.3)
Chloride: 110 mmol/L (ref 98–111)
Creatinine, Ser: 10.75 mg/dL — ABNORMAL HIGH (ref 0.44–1.00)
GFR, Estimated: 3 mL/min — ABNORMAL LOW (ref >60)
Glucose, Bld: 120 mg/dL — ABNORMAL HIGH (ref 70–99)
Potassium: 4.5 mmol/L (ref 3.5–5.1)
Sodium: 140 mmol/L (ref 135–145)
Total Bilirubin: 0.7 mg/dL (ref 0.3–1.2)
Total Protein: 8.6 g/dL — ABNORMAL HIGH (ref 6.5–8.1)

## 2022-11-04 LAB — PROTIME-INR
INR: 1.4 — ABNORMAL HIGH (ref 0.8–1.2)
Prothrombin Time: 16.6 seconds — ABNORMAL HIGH (ref 11.4–15.2)

## 2022-11-04 LAB — RESP PANEL BY RT-PCR (RSV, FLU A&B, COVID)  RVPGX2
Influenza A by PCR: NEGATIVE
Influenza B by PCR: NEGATIVE
Resp Syncytial Virus by PCR: NEGATIVE
SARS Coronavirus 2 by RT PCR: NEGATIVE

## 2022-11-04 LAB — APTT: aPTT: 31 seconds (ref 24–36)

## 2022-11-04 MED ORDER — SENNOSIDES-DOCUSATE SODIUM 8.6-50 MG PO TABS: 1.0000 | ORAL_TABLET | Freq: Every evening | ORAL | Status: DC | PRN

## 2022-11-04 MED ORDER — CHLORHEXIDINE GLUCONATE 0.12 % MT SOLN
15.0000 mL | Freq: Every day | OROMUCOSAL | Status: DC
  Administered 2022-11-06 – 2022-11-12 (×7): 15 mL via OROMUCOSAL
  Filled 2022-11-04 (×6): qty 15

## 2022-11-04 MED ORDER — SODIUM BICARBONATE 8.4 % IV SOLN
INTRAVENOUS | Status: DC
  Filled 2022-11-04 (×5): qty 1000
  Filled 2022-11-04: qty 150
  Filled 2022-11-04 (×2): qty 1000

## 2022-11-04 MED ORDER — SODIUM CHLORIDE 0.9 % IV SOLN: INTRAVENOUS | Status: DC

## 2022-11-04 MED ORDER — MAGNESIUM HYDROXIDE 400 MG/5ML PO SUSP: 30.0000 mL | Freq: Every day | ORAL | Status: DC | PRN

## 2022-11-04 MED ORDER — CHOLECALCIFEROL 25 MCG (1000 UT) PO TABS: 1000.0000 [IU] | ORAL_TABLET | Freq: Every day | ORAL | Status: DC

## 2022-11-04 MED ORDER — PANTOPRAZOLE SODIUM 40 MG PO TBEC: 40.0000 mg | DELAYED_RELEASE_TABLET | Freq: Every day | ORAL | Status: DC

## 2022-11-04 MED ORDER — SODIUM CHLORIDE 0.9 % IV SOLN
2.0000 g | INTRAVENOUS | Status: AC
  Administered 2022-11-04 – 2022-11-10 (×7): 2 g via INTRAVENOUS
  Filled 2022-11-04 (×2): qty 20
  Filled 2022-11-04: qty 2
  Filled 2022-11-04 (×3): qty 20
  Filled 2022-11-04: qty 2

## 2022-11-04 MED ORDER — HEPARIN SODIUM (PORCINE) 5000 UNIT/ML IJ SOLN
5000.0000 [IU] | Freq: Three times a day (TID) | INTRAMUSCULAR | Status: DC
  Administered 2022-11-05: 5000 [IU] via SUBCUTANEOUS
  Filled 2022-11-04: qty 1

## 2022-11-04 MED ORDER — PANTOPRAZOLE SODIUM 40 MG IV SOLR
40.0000 mg | Freq: Two times a day (BID) | INTRAVENOUS | Status: DC
  Administered 2022-11-05 – 2022-11-07 (×6): 40 mg via INTRAVENOUS
  Filled 2022-11-04 (×6): qty 10

## 2022-11-04 MED ORDER — PRENATAL MULTIVITAMIN CH
1.0000 | ORAL_TABLET | Freq: Every day | ORAL | Status: DC
  Administered 2022-11-05 – 2022-11-11 (×6): 1 via ORAL
  Filled 2022-11-04 (×8): qty 1

## 2022-11-04 MED ORDER — ONDANSETRON HCL 4 MG/2ML IJ SOLN
4.0000 mg | Freq: Four times a day (QID) | INTRAMUSCULAR | Status: DC | PRN
  Administered 2022-11-11: 4 mg via INTRAVENOUS
  Filled 2022-11-04: qty 2

## 2022-11-04 MED ORDER — LACTATED RINGERS IV SOLN: 150.0000 mL/h | INTRAVENOUS | Status: DC

## 2022-11-04 MED ORDER — SODIUM CHLORIDE 0.9 % IV SOLN: 2.0000 g | INTRAVENOUS | Status: DC

## 2022-11-04 MED ORDER — ACETAMINOPHEN 650 MG RE SUPP: 650.0000 mg | Freq: Four times a day (QID) | RECTAL | Status: DC | PRN

## 2022-11-04 MED ORDER — MIRTAZAPINE 15 MG PO TABS
7.5000 mg | ORAL_TABLET | Freq: Every day | ORAL | Status: DC
  Administered 2022-11-05 – 2022-11-11 (×8): 7.5 mg via ORAL
  Filled 2022-11-04 (×8): qty 1

## 2022-11-04 MED ORDER — SODIUM CHLORIDE 0.9 % IV BOLUS (SEPSIS)
1000.0000 mL | Freq: Once | INTRAVENOUS | Status: AC
  Administered 2022-11-04: 1000 mL via INTRAVENOUS

## 2022-11-04 MED ORDER — ESCITALOPRAM OXALATE 10 MG PO TABS
5.0000 mg | ORAL_TABLET | Freq: Every day | ORAL | Status: DC
  Administered 2022-11-05 – 2022-11-12 (×8): 5 mg via ORAL
  Filled 2022-11-04 (×8): qty 1

## 2022-11-04 MED ORDER — LACTATED RINGERS IV SOLN: INTRAVENOUS | Status: DC

## 2022-11-04 MED ORDER — TRAZODONE HCL 50 MG PO TABS: 25.0000 mg | ORAL_TABLET | Freq: Every evening | ORAL | Status: DC | PRN

## 2022-11-04 MED ORDER — ASPIRIN 81 MG PO TBEC: 81.0000 mg | DELAYED_RELEASE_TABLET | Freq: Every day | ORAL | Status: DC

## 2022-11-04 MED ORDER — ACETAMINOPHEN 325 MG PO TABS: 650.0000 mg | ORAL_TABLET | Freq: Four times a day (QID) | ORAL | Status: DC | PRN

## 2022-11-04 MED ORDER — ONDANSETRON HCL 4 MG PO TABS: 4.0000 mg | ORAL_TABLET | Freq: Four times a day (QID) | ORAL | Status: DC | PRN

## 2022-11-04 MED ORDER — ENSURE ENLIVE PO LIQD
237.0000 mL | Freq: Two times a day (BID) | ORAL | Status: DC
  Administered 2022-11-05 – 2022-11-12 (×14): 237 mL via ORAL

## 2022-11-04 NOTE — Assessment & Plan Note (Addendum)
-   We will continue Lexapro and Remeron.

## 2022-11-04 NOTE — Assessment & Plan Note (Signed)
-   Sepsis manifested by tachypnea, tachycardia and leukocytosis. - We will continue IV antibiotic therapy with Rocephin. - We will follow blood and urine cultures.

## 2022-11-04 NOTE — Assessment & Plan Note (Signed)
-   The patient will be placed on supplement coverage with NovoLog 

## 2022-11-04 NOTE — Progress Notes (Signed)
Unable to get labs, antibiotics given

## 2022-11-04 NOTE — ED Notes (Signed)
RN and other RN's are not able to get labs on this pt. Pt is a difficult stick and has notified lab for collection.

## 2022-11-04 NOTE — ED Triage Notes (Signed)
Pt sent from [facility] by [employer]. Pt has a hx of Dementia with contractures. Pt has been dx with a illeus, UTI and AKI with a creat of 7 per their report to the ED charge nurse.

## 2022-11-04 NOTE — Sepsis Progress Note (Signed)
Elink monitoring for the code sepsis protocol.  

## 2022-11-04 NOTE — Assessment & Plan Note (Addendum)
-   The patient will be admitted to a progressive bed. - We will continue hydration with IV normal saline. - We will continue monitoring her BUN and creatinine. - Nephrology consult will be obtained. - I will notify Dr. Cherylann Ratel about the patient.

## 2022-11-04 NOTE — H&P (Addendum)
[place]   PATIENT NAME: Kaitlin Wagner    MR#:  [id number]  DATE OF BIRTH:  1945/07/16  DATE OF ADMISSION:  11/04/2022.  The patient was seen and admitted on 11/04/2022.  PRIMARY CARE PHYSICIAN: Gwendlyn Deutscher, MD   Patient is coming from: Home  REQUESTING/REFERRING PHYSICIAN: Donna Bernard, MD  CHIEF COMPLAINT:   Chief Complaint  Patient presents with   Abdominal Pain    HISTORY OF PRESENT ILLNESS:  Kaitlin Wagner is a 77 y.o. female with medical history significant for type diabetes mellitus, dementia, GERD, hypertension and dyslipidemia, followed by [facility], who presented to the emergency room with acute onset of intractable nausea, vomiting and diarrhea for the last 3 days.  She has been having deteriorating renal functions and associated UTI.  [facility] today her creatinine was 7 UA showed nitrites and bacteria.  Recommendation was for presentation to the ER given previous history of dehydration and severe AKI.  She denies any bilious vomitus or hematemesis.  No melena or bright red bleeding per rectum or abdominal pain.  No chest pain or palpitations.  No cough or wheezing or dyspnea.  ED Course: When she came to the ER, BP was 147/37 with otherwise normal vital signs.  Creatinine was 10.75 and a BUN of 140 with a CO2 of a glucose of 120 and 2 protein of 8.6 with alk phos of 171.  CBC showed leukocytosis of 15.8 with neutrophilia.  PT was 16.6 and INR 1.4.  Lactic acid was 0.9 and later 1.9. EKG as reviewed by me : EKG showed sinus rhythm with a rate of 83 with Q waves inferiorly anteroseptally. Imaging: Portable chest x-ray showed no acute cardiopulmonary disease.  The patient was given 1 L bolus of IV normal saline, 2 g of IV Rocephin and was placed on IV lactated Ringer at 150 mill per hour.  She will be admitted to a progressive unit bed for further evaluation and management. PAST MEDICAL HISTORY:   Past Medical History:  Diagnosis Date    Aortic atherosclerosis (HCC) 08/05/2021   Cancer (HCC) colon   Diabetes mellitus without complication (HCC)    GERD (gastroesophageal reflux disease)    Hyperlipidemia    Hypertension   -History of colon cancer status post colectomy.  PAST SURGICAL HISTORY:   Past Surgical History:  Procedure Laterality Date   CHOLECYSTECTOMY     colon resection     laparoscopic   COLON SURGERY     COLONOSCOPY WITH PROPOFOL N/A 05/12/2015   Procedure: COLONOSCOPY WITH PROPOFOL;  Surgeon: Wallace Cullens, MD;  Location: [facility] ENDOSCOPY;  Service: Gastroenterology;  Laterality: N/A;   COLONOSCOPY WITH PROPOFOL N/A 05/12/2018   Procedure: COLONOSCOPY WITH PROPOFOL;  Surgeon: Toledo, Boykin Nearing, MD;  Location: [facility] ENDOSCOPY;  Service: Gastroenterology;  Laterality: N/A;   ESOPHAGOGASTRODUODENOSCOPY N/A 05/12/2015   Procedure: ESOPHAGOGASTRODUODENOSCOPY (EGD);  Surgeon: Wallace Cullens, MD;  Location: [facility] ENDOSCOPY;  Service: Gastroenterology;  Laterality: N/A;    SOCIAL HISTORY:   Social History   Tobacco Use   Smoking status: Never   Smokeless tobacco: Never  Substance Use Topics   Alcohol use: No    FAMILY HISTORY:   Mother died from stomach cancer.  DRUG ALLERGIES:  No Known Allergies  REVIEW OF SYSTEMS:   ROS As per history of present illness. All pertinent systems were reviewed above. Constitutional, HEENT, cardiovascular, respiratory, GI, GU, musculoskeletal, neuro, psychiatric, endocrine, integumentary and  hematologic systems were reviewed and are otherwise negative/unremarkable except for positive findings mentioned above in the HPI.   MEDICATIONS AT HOME:   Prior to Admission medications   Medication Sig Start Date End Date Taking? Authorizing Provider  Acetaminophen Childrens 160 MG/5ML SOLN Take 20 mLs by mouth 3 (three) times daily as needed for pain. 04/28/20  Yes [provider]  ANTI-ITCH lotion Apply 1 Application topically as needed. 10/29/22  Yes [provider]   aspirin EC 81 MG tablet Take 81 mg by mouth daily.   Yes [provider]  cefTRIAXone (ROCEPHIN) 1 g injection Inject 1 g into the muscle. 11/04/22  Yes [provider]  cefUROXime (CEFTIN) 250 MG tablet Take 250 mg by mouth 2 (two) times daily. 11/04/22  Yes [provider]  escitalopram (LEXAPRO) 5 MG tablet Take 5 mg by mouth daily. 10/30/22  Yes [provider]  esomeprazole (NEXIUM) 10 MG packet Take 10 mg by mouth 2 (two) times daily.   Yes [provider]  mirtazapine (REMERON) 7.5 MG tablet Take 1 tablet (7.5 mg total) by mouth at bedtime. 08/16/21  Yes Amin, Loura Halt, MD  nystatin (MYCOSTATIN/NYSTOP) powder Apply 1 Application topically 3 (three) times daily. 07/04/22  Yes [provider]  Prenatal Vit-Fe Fumarate-FA (PREPLUS) 27-1 MG TABS Take 1 tablet by mouth daily. 05/05/20  Yes [provider]  chlorhexidine (PERIDEX) 0.12 % solution Use as directed 15 mLs in the mouth or throat daily. Patient not taking: Reported on 11/04/2022 04/28/20   [provider]  Cholecalciferol 25 MCG (1000 UT) tablet Take 1,000 Units by mouth daily. Patient not taking: Reported on 07/31/2021    [provider]  dexamethasone (DECADRON) 4 MG tablet Take 1 tablet (4 mg total) by mouth daily. Patient not taking: Reported on 11/04/2022 08/16/21   Dimple Nanas, MD  feeding supplement (ENSURE ENLIVE / ENSURE PLUS) LIQD Take 237 mLs by mouth 2 (two) times daily between meals. Patient not taking: Reported on 11/04/2022 10/27/20   Rodolph Bong, MD  senna-docusate (SENOKOT-S) 8.6-50 MG tablet Take 1 tablet by mouth at bedtime as needed for moderate constipation. Patient not taking: Reported on 11/04/2022 08/16/21   Dimple Nanas, MD      VITAL SIGNS:  Blood pressure (!) 156/82, pulse 84, temperature 98 F (36.7 C), temperature source Oral, resp. rate 18, weight 67.1 kg, SpO2 100 %.  PHYSICAL EXAMINATION:  Physical  Exam  GENERAL:  77 y.o.-year-old female patient lying in the bed with no acute distress.  EYES: Pupils equal, round, reactive to light and accommodation. No scleral icterus. Extraocular muscles intact.  HEENT: Head atraumatic, normocephalic. Oropharynx and nasopharynx clear.  NECK:  Supple, no jugular venous distention. No thyroid enlargement, no tenderness.  LUNGS: Normal breath sounds bilaterally, no wheezing, rales,rhonchi or crepitation. No use of accessory muscles of respiration.  CARDIOVASCULAR: Regular rate and rhythm, S1, S2 normal. No murmurs, rubs, or gallops.  ABDOMEN: Soft, nondistended, nontender. Bowel sounds present. No organomegaly.  She has a large ventral nontender hernia. EXTREMITIES: No pedal edema, cyanosis, or clubbing.  NEUROLOGIC: Cranial nerves II through XII are intact. Muscle strength 5/5 in all extremities. Sensation intact. Gait not checked.  PSYCHIATRIC: The patient is alert and oriented x 3.  Normal affect and good eye contact. SKIN: No obvious rash, lesion, or ulcer.   LABORATORY PANEL:   CBC Recent Labs  Lab 11/04/22 1623  WBC 15.8*  HGB 13.9  HCT 43.9  PLT 268   ------------------------------------------------------------------------------------------------------------------  Chemistries  Recent Labs  Lab 11/04/22 1623  NA 140  K 4.5  CL 110  CO2 16*  GLUCOSE 120*  BUN 140*  CREATININE 10.75*  CALCIUM 8.9  AST 22  ALT 22  ALKPHOS 171*  BILITOT 0.7   ------------------------------------------------------------------------------------------------------------------  Cardiac Enzymes No results for input(s): "TROPONINI" in the last 168 hours. ------------------------------------------------------------------------------------------------------------------  RADIOLOGY:  DG Chest Port 1 View  Result Date: 11/04/2022 CLINICAL DATA:  Sepsis. EXAM: PORTABLE CHEST 1 VIEW COMPARISON:  October 20, 2020. FINDINGS: Mild cardiomegaly. Both lungs are  clear. The visualized skeletal structures are unremarkable. IMPRESSION: No active disease. Electronically Signed   By: Lupita Raider M.D.   On: 11/04/2022 16:47      IMPRESSION AND PLAN:  Assessment and Plan: * Acute kidney injury superimposed on chronic kidney disease (HCC) - The patient will be admitted to a progressive bed. - Wagner will continue hydration with IV normal saline. - Wagner will continue monitoring her BUN and creatinine. - Nephrology consult will be obtained. - I will notify Dr. Cherylann Ratel about the patient.   Acute gastroenteritis - Wagner will continue hydration with IV normal saline as mentioned above. - She will be placed on as needed antiemetics and scheduled Reglan. - Will advance her diet as tolerated. - Wagner will hold off her aspirin. - Wagner will place her on IV PPI therapy with Protonix.  Sepsis due to gram-negative UTI (HCC) - Sepsis manifested by tachypnea, tachycardia and leukocytosis. - Wagner will continue IV antibiotic therapy with Rocephin. - Wagner will follow blood and urine cultures.  Metabolic acidosis - She will be placed on IV bicarbonate drip. - Will follow her CO2.  Type 2 diabetes mellitus with chronic kidney disease (HCC) - The patient will be placed on supplement coverage with NovoLog.  Depression - Wagner will continue Lexapro and Remeron.   DVT prophylaxis: Subcutaneous heparin. Advanced Care Planning:  Code Status: The patient is DNR and has an out of facility DNR form but wanted to be intubated if she has respiratory failure/arrest without cardiac arrest.  This was discussed with her daughter. Family Communication:  The plan of care was discussed in details with the patient (and family). I answered all questions. The patient agreed to proceed with the above mentioned plan. Further management will depend upon hospital course. Disposition Plan: Back to previous home environment Consults called: Nephrology. All the records are reviewed and case discussed  with ED provider.  Status is: Inpatient.  At the time of the admission, it appears that the appropriate admission status for this patient is inpatient.  This is judged to be reasonable and necessary in order to provide the required intensity of service to ensure the patient's safety given the presenting symptoms, physical exam findings and initial radiographic and laboratory data in the context of comorbid conditions.  The patient requires inpatient status due to high intensity of service, high risk of further deterioration and high frequency of surveillance required.  I certify that at the time of admission, it is my clinical judgment that the patient will require inpatient hospital care extending more than 2 midnights.                            Dispo: The patient is from: Home              Anticipated d/c is to: Home              Patient  currently is not medically stable to d/c.              Difficult to place patient: No  Hannah Beat M.D on 11/05/2022 at 1:24 AM  [facility]   From 7 PM-7 AM, contact night-coverage [email or web address]  CC: Primary care physician; Gwendlyn Deutscher, MD

## 2022-11-04 NOTE — ED Provider Notes (Signed)
[facility] Provider Note   Event Date/Time   First MD Initiated Contact with Patient 11/04/22 1623     (approximate) History  Abdominal Pain  HPI Kaitlin Wagner is a 77 y.o. female with a past medical history of dementia, hypertension, and recurrent pressure ulcer of the buttocks who presents [facility] with concerns for worsening renal function and urinary tract infection.  Patient's labs were drawn in clinic today that showed creatinine of 7 and UA with nitrites and bacteria.  Patient does not have any complaints at this time.  I also spoke to [facility] at [facility] who relayed that patient has been having nausea/vomiting/diarrhea over the last few days as well.  Patient also has a history of dehydration leading to severe injury and is concerned that they may be what has happened today. ROS: Unable to assess   Physical Exam  Triage Vital Signs: ED Triage Vitals  Enc Vitals Group     BP      Pulse      Resp      Temp      Temp src      SpO2      Weight      Height      Head Circumference      Peak Flow      Pain Score      Pain Loc      Pain Edu?      Excl. in GC?    Most recent vital signs: Vitals:   11/04/22 2030 11/04/22 2045  BP: (!) 148/87   Pulse: 83   Resp: 19   Temp:  98 F (36.7 C)  SpO2: 93%    General: Awake, cooperative CV:  Good peripheral perfusion.  Resp:  Normal effort.  Abd:  Distended umbilical mass with palpable abdominal contents that is easily reducible and without tenderness palpation Other:  Elderly obese African-American female ED Results / Procedures / Treatments  Labs (all labs ordered are listed, but only abnormal results are displayed) Labs Reviewed  COMPREHENSIVE METABOLIC PANEL - Abnormal; Notable for the following components:      Result Value   CO2 16 (*)    Glucose, Bld 120 (*)    BUN 140 (*)    Creatinine, Ser 10.75 (*)    Total Protein 8.6 (*)    Alkaline Phosphatase 171 (*)    GFR,  Estimated 3 (*)    All other components within normal limits  CBC WITH DIFFERENTIAL/PLATELET - Abnormal; Notable for the following components:   WBC 15.8 (*)    Neutro Abs 13.1 (*)    Monocytes Absolute 1.2 (*)    Abs Immature Granulocytes 0.09 (*)    All other components within normal limits  PROTIME-INR - Abnormal; Notable for the following components:   Prothrombin Time 16.6 (*)    INR 1.4 (*)    All other components within normal limits  RESP PANEL BY RT-PCR (RSV, FLU A&B, COVID)  [id number]  CULTURE, BLOOD (ROUTINE X 2)  CULTURE, BLOOD (ROUTINE X 2)  LACTIC ACID, PLASMA  LACTIC ACID, PLASMA  APTT  URINALYSIS, W/ REFLEX TO CULTURE (INFECTION SUSPECTED)   EKG ED ECG REPORT I, Merwyn Katos, the attending physician, personally viewed and interpreted this ECG. Date: 11/04/2022 EKG Time: 1629 Rate: 83 Rhythm: normal sinus rhythm QRS Axis: normal Intervals: normal ST/T Wave abnormalities: normal Narrative Interpretation: no evidence of acute ischemia RADIOLOGY ED MD interpretation: One-view portable  chest x-ray interpreted by me shows no evidence of acute abnormalities including no pneumonia, pneumothorax, or widened mediastinum -Agree with radiology assessment Official radiology report(s): DG Chest Port 1 View  Result Date: 11/04/2022 CLINICAL DATA:  Sepsis. EXAM: PORTABLE CHEST 1 VIEW COMPARISON:  October 20, 2020. FINDINGS: Mild cardiomegaly. Both lungs are clear. The visualized skeletal structures are unremarkable. IMPRESSION: No active disease. Electronically Signed   By: Lupita Raider M.D.   On: 11/04/2022 16:47   PROCEDURES: Critical Care performed: Yes, see critical care procedure note(s) .1-3 Lead EKG Interpretation  Performed by: Merwyn Katos, MD Authorized by: Merwyn Katos, MD     Interpretation: normal     ECG rate:  71   ECG rate assessment: normal     Rhythm: sinus rhythm     Ectopy: none     Conduction: normal    MEDICATIONS ORDERED IN  ED: Medications  lactated ringers infusion ( Intravenous New Bag/Given 11/04/22 1633)  cefTRIAXone (ROCEPHIN) 2 g in sodium chloride 0.9 % 100 mL IVPB (0 g Intravenous Stopped 11/04/22 1702)  sodium chloride 0.9 % bolus 1,000 mL (0 mLs Intravenous Stopped 11/04/22 1919)   IMPRESSION / MDM / ASSESSMENT AND PLAN / ED COURSE  I reviewed the triage vital signs and the nursing notes.                             The patient is on the cardiac monitor to evaluate for evidence of arrhythmia and/or significant heart rate changes. Patient's presentation is most consistent with acute presentation with potential threat to life or bodily function.  This patient presents to the ED for concern of nausea/vomiting/diarrhea with acute kidney injury, this involves an extensive number of treatment options, and is a complaint that carries with it a high risk of complications and morbidity.  The differential diagnosis includes UTI, gastroenteritis, appendicitis, small bowel obstruction Co morbidities that complicate the patient evaluation  Hypertension, advanced dementia Additional history obtained:  Additional history obtained from Dr[facility] Arta Silence from [facility]  External records from outside source obtained and reviewed including admission in 07/31/2021 for acute kidney injury Lab Tests:  I Ordered, and personally interpreted labs.  The pertinent results include: Creatinine 10.75, BUN 140, white blood cell count 15.8, INR 1.4 Imaging Studies ordered:  I ordered imaging studies including chest x-ray  I independently visualized and interpreted imaging which showed no evidence of acute abnormalities  I agree with the radiologist interpretation Cardiac Monitoring: / EKG:  The patient was maintained on a cardiac monitor.  I personally viewed and interpreted the cardiac monitored which showed an underlying rhythm of: Normal sinus rhythm Consultations Obtained:  I requested consultation with the hospitalist service,  and  discussed lab and imaging findings as well as pertinent plan - they recommend: Admission Problem List / ED Course / Critical interventions / Medication management  Acute kidney injury, urinary tract infection  I ordered medication including normal saline, Rocephin for AKI and UTI  Reevaluation of the patient after these medicines showed that the patient stayed the same  I have reviewed the patients home medicines and have made adjustments as needed  Dispo: Admit to medicine       FINAL CLINICAL IMPRESSION(S) / ED DIAGNOSES   Final diagnoses:  Acute renal failure, unspecified acute renal failure type (HCC)  Urinary tract infection with hematuria, site unspecified   Rx / DC Orders   ED Discharge Orders  None      Note:  This document was prepared using Dragon voice recognition software and may include unintentional dictation errors.   Merwyn Katos, MD 11/04/22 2214

## 2022-11-04 NOTE — Assessment & Plan Note (Addendum)
-   We will continue hydration with IV normal saline as mentioned above. - She will be placed on as needed antiemetics and scheduled Reglan. - Will advance her diet as tolerated. - We will hold off her aspirin. - We will place her on IV PPI therapy with Protonix.

## 2022-11-04 NOTE — Consult Note (Signed)
CODE SEPSIS - PHARMACY COMMUNICATION  **Broad Spectrum Antibiotics should be administered within 1 hour of Sepsis diagnosis**  Time Code Sepsis Called/Page Received: 1625  Antibiotics Ordered: ceftriaxone  Time of 1st antibiotic administration: 1632  Additional action taken by pharmacy: N/A  Barrie Folk ,PharmD Clinical Pharmacist  11/04/2022  4:28 PM

## 2022-11-05 ENCOUNTER — Inpatient Hospital Stay: Payer: Medicare (Managed Care)

## 2022-11-05 ENCOUNTER — Other Ambulatory Visit: Payer: Self-pay

## 2022-11-05 ENCOUNTER — Encounter: Payer: Self-pay | Admitting: Osteopathic Medicine

## 2022-11-05 DIAGNOSIS — F32A Depression, unspecified: Secondary | ICD-10-CM | POA: Diagnosis present

## 2022-11-05 DIAGNOSIS — E872 Acidosis, unspecified: Secondary | ICD-10-CM | POA: Diagnosis present

## 2022-11-05 DIAGNOSIS — E860 Dehydration: Secondary | ICD-10-CM | POA: Diagnosis present

## 2022-11-05 DIAGNOSIS — F0393 Unspecified dementia, unspecified severity, with mood disturbance: Secondary | ICD-10-CM | POA: Diagnosis present

## 2022-11-05 DIAGNOSIS — D631 Anemia in chronic kidney disease: Secondary | ICD-10-CM | POA: Diagnosis present

## 2022-11-05 DIAGNOSIS — E1122 Type 2 diabetes mellitus with diabetic chronic kidney disease: Secondary | ICD-10-CM | POA: Diagnosis present

## 2022-11-05 DIAGNOSIS — I129 Hypertensive chronic kidney disease with stage 1 through stage 4 chronic kidney disease, or unspecified chronic kidney disease: Secondary | ICD-10-CM | POA: Diagnosis present

## 2022-11-05 DIAGNOSIS — N1330 Unspecified hydronephrosis: Secondary | ICD-10-CM | POA: Diagnosis present

## 2022-11-05 DIAGNOSIS — K219 Gastro-esophageal reflux disease without esophagitis: Secondary | ICD-10-CM | POA: Diagnosis present

## 2022-11-05 DIAGNOSIS — E1140 Type 2 diabetes mellitus with diabetic neuropathy, unspecified: Secondary | ICD-10-CM | POA: Diagnosis present

## 2022-11-05 DIAGNOSIS — I700 Atherosclerosis of aorta: Secondary | ICD-10-CM | POA: Diagnosis present

## 2022-11-05 DIAGNOSIS — E861 Hypovolemia: Secondary | ICD-10-CM | POA: Diagnosis present

## 2022-11-05 DIAGNOSIS — A4150 Gram-negative sepsis, unspecified: Secondary | ICD-10-CM | POA: Diagnosis present

## 2022-11-05 DIAGNOSIS — N390 Urinary tract infection, site not specified: Secondary | ICD-10-CM

## 2022-11-05 DIAGNOSIS — G4730 Sleep apnea, unspecified: Secondary | ICD-10-CM | POA: Diagnosis present

## 2022-11-05 DIAGNOSIS — I872 Venous insufficiency (chronic) (peripheral): Secondary | ICD-10-CM | POA: Diagnosis present

## 2022-11-05 DIAGNOSIS — E43 Unspecified severe protein-calorie malnutrition: Secondary | ICD-10-CM | POA: Diagnosis present

## 2022-11-05 DIAGNOSIS — R319 Hematuria, unspecified: Secondary | ICD-10-CM | POA: Diagnosis present

## 2022-11-05 DIAGNOSIS — E785 Hyperlipidemia, unspecified: Secondary | ICD-10-CM | POA: Diagnosis present

## 2022-11-05 DIAGNOSIS — Z1152 Encounter for screening for COVID-19: Secondary | ICD-10-CM | POA: Diagnosis not present

## 2022-11-05 DIAGNOSIS — N179 Acute kidney failure, unspecified: Secondary | ICD-10-CM | POA: Diagnosis present

## 2022-11-05 DIAGNOSIS — N184 Chronic kidney disease, stage 4 (severe): Secondary | ICD-10-CM | POA: Diagnosis present

## 2022-11-05 DIAGNOSIS — E876 Hypokalemia: Secondary | ICD-10-CM | POA: Diagnosis not present

## 2022-11-05 DIAGNOSIS — R197 Diarrhea, unspecified: Secondary | ICD-10-CM | POA: Diagnosis present

## 2022-11-05 DIAGNOSIS — N189 Chronic kidney disease, unspecified: Secondary | ICD-10-CM | POA: Diagnosis not present

## 2022-11-05 DIAGNOSIS — Z66 Do not resuscitate: Secondary | ICD-10-CM | POA: Diagnosis present

## 2022-11-05 LAB — BASIC METABOLIC PANEL
Anion gap: 16 — ABNORMAL HIGH (ref 5–15)
Anion gap: 16 — ABNORMAL HIGH (ref 5–15)
BUN: 144 mg/dL — ABNORMAL HIGH (ref 8–23)
BUN: 124 mg/dL — ABNORMAL HIGH (ref 8–23)
CO2: 15 mmol/L — ABNORMAL LOW (ref 22–32)
CO2: 16 mmol/L — ABNORMAL LOW (ref 22–32)
Calcium: 8.6 mg/dL — ABNORMAL LOW (ref 8.9–10.3)
Calcium: 7.9 mg/dL — ABNORMAL LOW (ref 8.9–10.3)
Chloride: 110 mmol/L (ref 98–111)
Chloride: 108 mmol/L (ref 98–111)
Creatinine, Ser: 10.62 mg/dL — ABNORMAL HIGH (ref 0.44–1.00)
Creatinine, Ser: 9.63 mg/dL — ABNORMAL HIGH (ref 0.44–1.00)
GFR, Estimated: 3 mL/min — ABNORMAL LOW (ref >60)
GFR, Estimated: 4 mL/min — ABNORMAL LOW (ref >60)
Glucose, Bld: 152 mg/dL — ABNORMAL HIGH (ref 70–99)
Glucose, Bld: 167 mg/dL — ABNORMAL HIGH (ref 70–99)
Potassium: 4 mmol/L (ref 3.5–5.1)
Potassium: 3.2 mmol/L — ABNORMAL LOW (ref 3.5–5.1)
Sodium: 141 mmol/L (ref 135–145)
Sodium: 140 mmol/L (ref 135–145)

## 2022-11-05 LAB — CBG MONITORING, ED
Glucose-Capillary: 115 mg/dL — ABNORMAL HIGH (ref 70–99)
Glucose-Capillary: 116 mg/dL — ABNORMAL HIGH (ref 70–99)
Glucose-Capillary: 107 mg/dL — ABNORMAL HIGH (ref 70–99)

## 2022-11-05 LAB — PROTIME-INR
INR: 1.3 — ABNORMAL HIGH (ref 0.8–1.2)
Prothrombin Time: 16.4 seconds — ABNORMAL HIGH (ref 11.4–15.2)

## 2022-11-05 LAB — CULTURE, BLOOD (ROUTINE X 2): Culture: NO GROWTH

## 2022-11-05 LAB — LACTIC ACID, PLASMA
Lactic Acid, Venous: 0.9 mmol/L (ref 0.5–1.9)
Lactic Acid, Venous: 2.5 mmol/L (ref 0.5–1.9)

## 2022-11-05 LAB — CBC
HCT: 39.2 % (ref 36.0–46.0)
Hemoglobin: 12.7 g/dL (ref 12.0–15.0)
MCH: 29.3 pg (ref 26.0–34.0)
MCHC: 32.4 g/dL (ref 30.0–36.0)
MCV: 90.3 fL (ref 80.0–100.0)
Platelets: 239 10*3/uL (ref 150–400)
RBC: 4.34 MIL/uL (ref 3.87–5.11)
RDW: 15.1 % (ref 11.5–15.5)
WBC: 13.5 10*3/uL — ABNORMAL HIGH (ref 4.0–10.5)
nRBC: 0 % (ref 0.0–0.2)

## 2022-11-05 LAB — GLUCOSE, CAPILLARY
Glucose-Capillary: 168 mg/dL — ABNORMAL HIGH (ref 70–99)
Glucose-Capillary: 103 mg/dL — ABNORMAL HIGH (ref 70–99)

## 2022-11-05 LAB — CK: Total CK: 217 U/L (ref 38–234)

## 2022-11-05 LAB — APTT: aPTT: 33 seconds (ref 24–36)

## 2022-11-05 LAB — HEMOGLOBIN A1C
Hgb A1c MFr Bld: 5.4 % (ref 4.8–5.6)
Mean Plasma Glucose: 108.28 mg/dL

## 2022-11-05 MED ORDER — INSULIN ASPART 100 UNIT/ML IJ SOLN: 0.0000 [IU] | Freq: Every day | INTRAMUSCULAR | Status: DC

## 2022-11-05 MED ORDER — HEPARIN SODIUM (PORCINE) 5000 UNIT/ML IJ SOLN: 5000.0000 [IU] | Freq: Three times a day (TID) | INTRAMUSCULAR | Status: DC

## 2022-11-05 MED ORDER — HEPARIN SODIUM (PORCINE) 5000 UNIT/ML IJ SOLN
5000.0000 [IU] | Freq: Three times a day (TID) | INTRAMUSCULAR | Status: DC
  Administered 2022-11-05 – 2022-11-12 (×21): 5000 [IU] via SUBCUTANEOUS
  Filled 2022-11-05 (×21): qty 1

## 2022-11-05 MED ORDER — METOCLOPRAMIDE HCL 5 MG/ML IJ SOLN
5.0000 mg | Freq: Four times a day (QID) | INTRAMUSCULAR | Status: DC
  Administered 2022-11-05 – 2022-11-12 (×29): 5 mg via INTRAVENOUS
  Filled 2022-11-05 (×29): qty 2

## 2022-11-05 MED ORDER — ENOXAPARIN SODIUM 40 MG/0.4ML IJ SOSY: 40.0000 mg | PREFILLED_SYRINGE | INTRAMUSCULAR | Status: DC

## 2022-11-05 MED ORDER — ONDANSETRON HCL 4 MG/2ML IJ SOLN: 4.0000 mg | Freq: Four times a day (QID) | INTRAMUSCULAR | Status: DC | PRN

## 2022-11-05 MED ORDER — ONDANSETRON HCL 4 MG PO TABS: 4.0000 mg | ORAL_TABLET | Freq: Four times a day (QID) | ORAL | Status: DC | PRN

## 2022-11-05 MED ORDER — INSULIN ASPART 100 UNIT/ML IJ SOLN
0.0000 [IU] | Freq: Three times a day (TID) | INTRAMUSCULAR | Status: DC
  Administered 2022-11-05: 2 [IU] via SUBCUTANEOUS
  Administered 2022-11-09 – 2022-11-10 (×3): 1 [IU] via SUBCUTANEOUS
  Administered 2022-11-12: 2 [IU] via SUBCUTANEOUS
  Filled 2022-11-05 (×5): qty 1

## 2022-11-05 MED ORDER — SODIUM CHLORIDE 0.9 % IV SOLN: INTRAVENOUS | Status: DC

## 2022-11-05 MED ORDER — GERHARDT'S BUTT CREAM
TOPICAL_CREAM | Freq: Two times a day (BID) | CUTANEOUS | Status: DC
  Filled 2022-11-05 (×2): qty 1

## 2022-11-05 MED ORDER — SODIUM CHLORIDE 0.9 % IV BOLUS
500.0000 mL | Freq: Once | INTRAVENOUS | Status: AC
  Administered 2022-11-05: 500 mL via INTRAVENOUS

## 2022-11-05 NOTE — Progress Notes (Signed)
PROGRESS NOTE    Kaitlin Wagner   ZOX:[id number] DOB: 05-11-1946  DOA: 11/04/2022 Date of Service: 11/05/22 PCP: Gwendlyn Deutscher, MD     Brief Narrative / Hospital Course:  Kaitlin Wagner is a 77 y.o. female with medical history significant for type diabetes mellitus, dementia, GERD, hypertension and dyslipidemia, followed by [facility] hospice, who presented to the emergency room with acute onset of intractable nausea, vomiting and diarrhea for the last 3 days. [place] renal function, recent UTI.  [facility] her creatinine was 7 UA showed nitrites and bacteria. Recommendation was for presentation to the ER given previous history of dehydration and severe AKI.  05/06: VSS, brief tachycardia/tachypnea, lactate normal, Cr 10.75, BUN 140, CO2 16, Cl 110. (+)UA for UTI. Renal US (+)Mild left hydronephrosis, new since prior examination, b/l nonobstructing nephrolithiasis, increased right renal cortical echogenicity c/w medical renal disease. Tx: 1 L bolus of IV NS, 2 g of IV Rocephin, IV LR 150 mL/h. Admitted to hospitalist service. Nephrology consulted. IV Bicarb drip for metabolic acidosis.  05/07: Cr increased to 10.62, BUN 144, WBC improved to 13.5. RN reported that fluids were not given overnight d/t confusion w/ orders and per report the admitting physician advised give bicarb and defer fluids, certainly may explain the lack of renal improvement. CT abd/pelvis show it's non obstructing hydronephrosis. D/c NS and run sodium bicarb at 150 mL/h. Nephrology to d/w family re: GOC dialysis but not indicated at this time.   Consultants:  Nephrology   Procedures: none     ASSESSMENT & PLAN:   Principal Problem:   Acute kidney injury superimposed on chronic kidney disease (HCC) Active Problems:   Acute gastroenteritis   Sepsis due to gram-negative UTI (HCC)   Metabolic acidosis   Diabetes mellitus type 2, uncomplicated (HCC)   Advanced dementia (HCC)   GERD  (gastroesophageal reflux disease)   Essential (primary) hypertension   Depression   Acute renal failure (ARF) (HCC)   Protein-calorie malnutrition, severe   Aortic atherosclerosis (HCC)   Intractable diarrhea   Urinary tract infection with hematuria   History of colon cancer   Hyperlipidemia   Neuropathy   Osteopenia   Polyneuropathy   Sleep apnea   Venous stasis dermatitis of both lower extremities   AKI on CKD4  D/t UTI/dehydration hypokalemia on chronic kidney disease stage IV with last known creatinine 2.79 and GFR of 17 on 08/16/21  Monitor BMP Hold nephrotoxic medications  Nephrology following D/c NS and run sodium bicarb at 150 mL/h.  Nephrology to d/w family re: GOC dialysis but not indicated at this time.   Acute gastroenteritis hydration as above, NS --> sodium bicarb as needed antiemetics and scheduled Reglan. advance diet as tolerated. Hold NSAID, ASA IV PPI therapy with Protonix.  Depression Lexapro and Remeron.  Type 2 diabetes mellitus with chronic kidney disease (HCC) SSI w/ NovoLog.  Sepsis due to gram-negative UTI (HCC) Sepsis manifested by tachypnea, tachycardia and leukocytosis but tachycardia and tachypnea were unsustained IV Rocephin. follow blood and urine cultures.  Metabolic acidosis IV bicarbonate drip. follow CO2/BMP    DVT prophylaxis: heparin Pertinent IV fluids/nutrition: NS 125 mL/h Central lines / invasive devices: none  Code Status: no CPR, ok with intubation if respiratory arrest - per H&P documentation  ACP documentation reviewed: 05/07 - DNR order and advanced directive   Current Admission Status: inpatient   TOC needs / Dispo plan: pending  Barriers to discharge / significant pending  items: renal improvement, may need dialysis, anticipate will be here at least several days              Subjective / Brief ROS:  Patient reports feeling fine, she is minimally verbal, seems tired but is awake  Denies CP/SOB.   Pain controlled.    Family Communication: none at this time will call later today     Objective Findings:  Vitals:   11/05/22 1200 11/05/22 1300 11/05/22 1400 11/05/22 1441  BP: 129/78 (!) 145/72 (!) 112/59   Pulse: 69 70 72   Resp: 16 (!) 25 17   Temp: 98.2 F (36.8 C)   97.7 F (36.5 C)  TempSrc: Oral   Oral  SpO2: 99% 99% 98%   Weight:        Intake/Output Summary (Last 24 hours) at 11/05/2022 1512 Last data filed at 11/05/2022 1441 Gross per 24 hour  Intake 333.42 ml  Output 500 ml  Net -166.58 ml   Filed Weights   11/04/22 1625  Weight: 67.1 kg    Examination:  Physical Exam Constitutional:      General: She is not in acute distress. Cardiovascular:     Rate and Rhythm: Normal rate and regular rhythm.  Pulmonary:     Effort: Pulmonary effort is normal. No respiratory distress.     Breath sounds: Normal breath sounds.  Abdominal:     General: Abdomen is protuberant.     Palpations: There is no fluid wave.  Skin:    General: Skin is warm and dry.  Neurological:     Mental Status: She is alert.  Psychiatric:        Behavior: Behavior normal.          Scheduled Medications:   chlorhexidine  15 mL Mouth/Throat Daily   escitalopram  5 mg Oral Daily   feeding supplement  237 mL Oral BID BM   Gerhardt's butt cream   Topical BID   heparin injection (subcutaneous)  5,000 Units Subcutaneous Q8H   insulin aspart  0-5 Units Subcutaneous QHS   insulin aspart  0-9 Units Subcutaneous TID WC   metoCLOPramide (REGLAN) injection  5 mg Intravenous Q6H   mirtazapine  7.5 mg Oral QHS   pantoprazole (PROTONIX) IV  40 mg Intravenous Q12H   prenatal multivitamin  1 tablet Oral Daily    Continuous Infusions:  cefTRIAXone (ROCEPHIN)  IV Stopped (11/04/22 1702)   sodium bicarbonate 150 mEq in dextrose 5 % 1,150 mL infusion 150 mL/hr at 11/05/22 1241    PRN Medications:  acetaminophen **OR** acetaminophen, magnesium hydroxide, ondansetron **OR** ondansetron  (ZOFRAN) IV, senna-docusate, traZODone  Antimicrobials from admission:  Anti-infectives (From admission, onward)    Start     Dose/Rate Route Frequency Ordered Stop   11/04/22 2300  cefTRIAXone (ROCEPHIN) 2 g in sodium chloride 0.9 % 100 mL IVPB  Status:  Discontinued        2 g 200 mL/hr over 30 Minutes Intravenous Every 24 hours 11/04/22 2249 11/04/22 2348   11/04/22 1630  cefTRIAXone (ROCEPHIN) 2 g in sodium chloride 0.9 % 100 mL IVPB        2 g 200 mL/hr over 30 Minutes Intravenous Every 24 hours 11/04/22 1625 11/11/22 1629           Data Reviewed:  I have personally reviewed the following...  CBC: Recent Labs  Lab 11/04/22 1623 11/05/22 0611  WBC 15.8* 13.5*  NEUTROABS 13.1*  --   HGB 13.9 12.7  HCT  43.9 39.2  MCV 92.4 90.3  PLT 268 239   Basic Metabolic Panel: Recent Labs  Lab 11/04/22 1623 11/05/22 0611 11/05/22 1240  NA 140 141 140  K 4.5 4.0 3.2*  CL 110 110 108  CO2 16* 15* 16*  GLUCOSE 120* 152* 167*  BUN 140* 144* 124*  CREATININE 10.75* 10.62* 9.63*  CALCIUM 8.9 8.6* 7.9*   GFR: CrCl cannot be calculated (Unknown ideal weight.). Liver Function Tests: Recent Labs  Lab 11/04/22 1623  AST 22  ALT 22  ALKPHOS 171*  BILITOT 0.7  PROT 8.6*  ALBUMIN 3.9   No results for input(s): "LIPASE", "AMYLASE" in the last 168 hours. No results for input(s): "AMMONIA" in the last 168 hours. Coagulation Profile: Recent Labs  Lab 11/04/22 1623  INR 1.4*   Cardiac Enzymes: Recent Labs  Lab 11/05/22 0610  CKTOTAL 217   BNP (last 3 results) No results for input(s): "PROBNP" in the last 8760 hours. HbA1C: Recent Labs    11/04/22 1623  HGBA1C 5.4   CBG: Recent Labs  Lab 11/05/22 0206 11/05/22 0915 11/05/22 1146  GLUCAP 115* 116* 107*   Lipid Profile: No results for input(s): "CHOL", "HDL", "LDLCALC", "TRIG", "CHOLHDL", "LDLDIRECT" in the last 72 hours. Thyroid Function Tests: No results for input(s): "TSH", "T4TOTAL", "FREET4",  "T3FREE", "THYROIDAB" in the last 72 hours. Anemia Panel: No results for input(s): "VITAMINB12", "FOLATE", "FERRITIN", "TIBC", "IRON", "RETICCTPCT" in the last 72 hours. Most Recent Urinalysis On File:     Component Value Date/Time   COLORURINE YELLOW (A) 11/04/2022 2250   APPEARANCEUR TURBID (A) 11/04/2022 2250   LABSPEC 1.010 11/04/2022 2250   PHURINE 5.0 11/04/2022 2250   GLUCOSEU NEGATIVE 11/04/2022 2250   HGBUR MODERATE (A) 11/04/2022 2250   BILIRUBINUR NEGATIVE 11/04/2022 2250   KETONESUR NEGATIVE 11/04/2022 2250   PROTEINUR 100 (A) 11/04/2022 2250   NITRITE NEGATIVE 11/04/2022 2250   LEUKOCYTESUR LARGE (A) 11/04/2022 2250   Sepsis Labs: @LABRCNTIP (procalcitonin:4,lacticidven:4) Microbiology: Recent Results (from the past 240 hour(s))  Resp panel by RT-PCR (RSV, Flu A&B, Covid) Anterior Nasal Swab     Status: None   Collection Time: 11/04/22  4:25 PM   Specimen: Anterior Nasal Swab  Result Value Ref Range Status   SARS Coronavirus 2 by RT PCR NEGATIVE NEGATIVE Final    Comment: (NOTE) SARS-CoV-2 target nucleic acids are NOT DETECTED.  The SARS-CoV-2 RNA is generally detectable in upper respiratory specimens during the acute phase of infection. The lowest concentration of SARS-CoV-2 viral copies this assay can detect is 138 copies/mL. A negative result does not preclude SARS-Cov-2 infection and should not be used as the sole basis for treatment or other patient management decisions. A negative result may occur with  improper specimen collection/handling, submission of specimen other than nasopharyngeal swab, presence of viral mutation(s) within the areas targeted by this assay, and inadequate number of viral copies(<138 copies/mL). A negative result must be combined with clinical observations, patient history, and epidemiological information. The expected result is Negative.  Fact Sheet for Patients:  BloggerCourse.com  Fact Sheet for  Healthcare Providers:  SeriousBroker.it  This test is no t yet approved or cleared by the [place] FDA and  has been authorized for detection and/or diagnosis of SARS-CoV-2 by FDA under an Emergency Use Authorization (EUA). This EUA will remain  in effect (meaning this test can be used) for the duration of the COVID-19 declaration under Section 564(b)(1) of the Act, 21 U.S.C.section 360bbb-3(b)(1), unless the authorization is terminated  or  revoked sooner.       Influenza A by PCR NEGATIVE NEGATIVE Final   Influenza B by PCR NEGATIVE NEGATIVE Final    Comment: (NOTE) The Xpert Xpress SARS-CoV-2/FLU/RSV plus assay is intended as an aid in the diagnosis of influenza from Nasopharyngeal swab specimens and should not be used as a sole basis for treatment. Nasal washings and aspirates are unacceptable for Xpert Xpress SARS-CoV-2/FLU/RSV testing.  Fact Sheet for Patients: BloggerCourse.com  Fact Sheet for Healthcare Providers: SeriousBroker.it  This test is not yet approved or cleared by the [place] FDA and has been authorized for detection and/or diagnosis of SARS-CoV-2 by FDA under an Emergency Use Authorization (EUA). This EUA will remain in effect (meaning this test can be used) for the duration of the COVID-19 declaration under Section 564(b)(1) of the Act, 21 U.S.C. section 360bbb-3(b)(1), unless the authorization is terminated or revoked.     Resp Syncytial Virus by PCR NEGATIVE NEGATIVE Final    Comment: (NOTE) Fact Sheet for Patients: BloggerCourse.com  Fact Sheet for Healthcare Providers: SeriousBroker.it  This test is not yet approved or cleared by the [place] FDA and has been authorized for detection and/or diagnosis of SARS-CoV-2 by FDA under an Emergency Use Authorization (EUA). This EUA will remain in effect (meaning this  test can be used) for the duration of the COVID-19 declaration under Section 564(b)(1) of the Act, 21 U.S.C. section 360bbb-3(b)(1), unless the authorization is terminated or revoked.  Performed at [facility], [address] [place], [place] [address]   Blood Culture (routine x 2)     Status: None (Preliminary result)   Collection Time: 11/04/22  8:33 PM   Specimen: BLOOD  Result Value Ref Range Status   Specimen Description BLOOD BLOOD LEFT HAND  Final   Special Requests   Final    BOTTLES DRAWN AEROBIC AND ANAEROBIC Blood Culture adequate volume   Culture   Final    NO GROWTH < 24 HOURS Performed at [facility], [address], [place], [place] [address]    Report Status PENDING  Incomplete  Blood Culture (routine x 2)     Status: None (Preliminary result)   Collection Time: 11/04/22  8:35 PM   Specimen: BLOOD  Result Value Ref Range Status   Specimen Description BLOOD BLOOD RIGHT HAND  Final   Special Requests   Final    BOTTLES DRAWN AEROBIC AND ANAEROBIC Blood Culture results may not be optimal due to an inadequate volume of blood received in culture bottles   Culture   Final    NO GROWTH < 24 HOURS Performed at [facility], [address], [place], [place] [address]    Report Status PENDING  Incomplete      Radiology Studies last 3 days: CT ABDOMEN PELVIS WO CONTRAST  Result Date: 11/05/2022 CLINICAL DATA:  UTI and AKI.  Hydronephrosis EXAM: CT ABDOMEN AND PELVIS WITHOUT CONTRAST TECHNIQUE: Multidetector CT imaging of the abdomen and pelvis was performed following the standard protocol without IV contrast. RADIATION DOSE REDUCTION: This exam was performed according to the departmental dose-optimization program which includes automated exposure control, adjustment of the mA and/or kV according to patient size and/or use of iterative reconstruction technique. COMPARISON:  Ultrasound 11/04/2022 and older.  CT scan 07/31/2021 FINDINGS: Lower  chest: Breathing motion at the lung bases. Heart is enlarged. Coronary artery calcifications are seen. There is linear opacity at the bases likely scar or atelectasis. No pleural effusion. Hepatobiliary: Breathing motion. On  this non IV contrast exam, the liver is grossly preserved. Previous cholecystectomy. Pancreas: Global moderate pancreatic atrophy.  No obvious mass. Spleen: Normal in size without focal abnormality. Adrenals/Urinary Tract: The adrenal glands are preserved. Significant motion. Bilateral nonobstructing stones. The largest is seen on the left side but size measurement is difficult with the motion. Prominent bilateral renal sinus fat. No collecting system dilatation. The ureters have normal course and caliber down to the bladder. Foley catheter in the nondilated urinary bladder. Stomach/Bowel: Stomach is nondilated. There are several air-filled loops of distended small and large bowel in the anterior abdomen with a few air-fluid levels but these are nondilated. Nonspecific. Appendix is not well seen. Vascular/Lymphatic: Scattered moderate to severe vascular calcifications. Normal caliber aorta and IVC. No discrete abnormal lymph node enlargement identified in the abdomen and pelvis. There is one node along the external iliac chain which was prominent on the prior examination with a short axis of 8 mm. Today on series 2, image 65 this similar at 8 mm in short axis again. Reproductive: Uterus and bilateral adnexa are unremarkable. Other: Protuberant anterior abdominal wall with rectus muscle diastasis. Global muscle fatty atrophy. No free air or free fluid. Musculoskeletal: Degenerative changes seen diffusely of the spine and pelvis. Osteopenia. There is slight Schmorl's node deformity of the superior endplate of T11 which was not seen on the study of 2023. Please correlate for any history. Diffuse bridging osteophytes and syndesmophytes along the spine. IMPRESSION: Significant motion. Bilateral  nonobstructing renal stones. No collecting system dilatation. Foley catheter in the bladder. Multiple distended loops of bowel in the anterior abdomen, both small and large bowel without obstruction or dilatation. Nonspecific. Few air-fluid levels. Stable protuberant anterior abdominal wall. Enlarged heart. Since the prior CT scan there is new mild compression of the superior endplate of T11 of uncertain etiology and exact age. Please correlate with any known history and specific symptoms Electronically Signed   By: Karen Kays M.D.   On: 11/05/2022 11:27   US Renal  Result Date: 11/05/2022 CLINICAL DATA:  Acute renal failure EXAM: RENAL / URINARY TRACT ULTRASOUND COMPLETE COMPARISON:  CT and ultrasound 07/31/2021 FINDINGS: Right Kidney: Renal measurements: 10.2 x 4.5 x 4.2 cm = volume: 101 mL. Renal cortical thickness is preserved. Cortical echogenicity is diffusely increased in keeping with changes of underlying medical renal disease. 3 mm nonobstructing calculus noted within the lower pole of the right kidney. No hydronephrosis. No intrarenal masses are seen. An 18 mm simple cortical cyst is noted within the upper pole of the right kidney. No follow-up imaging is recommended for this lesion. Left Kidney: Renal measurements: 10.5 x 5.5 x 5.0 cm = volume: 149 mL. Renal cortical thickness is preserved and cortical echogenicity is normal. Mild left hydronephrosis has developed. 10 mm nonobstructing calculus noted within the lower pole the left kidney. No intrarenal masses. Bladder: Appears normal for degree of bladder distention. Bilateral ureteral jets are identified Other: None. IMPRESSION: 1. Mild left hydronephrosis, new since prior examination. 2. Bilateral nonobstructing nephrolithiasis. 3. Diffusely increased right renal cortical echogenicity in keeping with changes of underlying medical renal disease. Electronically Signed   By: Helyn Numbers M.D.   On: 11/05/2022 00:19   DG Chest Port 1 View  Result  Date: 11/04/2022 CLINICAL DATA:  Sepsis. EXAM: PORTABLE CHEST 1 VIEW COMPARISON:  October 20, 2020. FINDINGS: Mild cardiomegaly. Both lungs are clear. The visualized skeletal structures are unremarkable. IMPRESSION: No active disease. Electronically Signed   By: Lupita Raider  M.D.   On: 11/04/2022 16:47             LOS: 1 day    Time spent: 35 min    Sunnie Nielsen, DO [facility] 11/05/2022, 3:12 PM    Dictation software may have been used to generate the above note. Typos may occur and escape review in typed/dictated notes. Please contact Dr Lyn Hollingshead directly for clarity if needed.  Staff may message me via secure chat in Epic  but this may not receive an immediate response,  please page me for urgent matters!  If 7PM-7AM, please contact night coverage [email or web address]

## 2022-11-05 NOTE — Progress Notes (Signed)
       CROSS COVER NOTE  NAME: Kaitlin Wagner MRN: [id number] DOB : 11/24/1945 ATTENDING PHYSICIAN: Sunnie Nielsen, DO    Date of Service   11/05/2022   HPI/Events of Note   Report/Request *** On Review of chart *** Bedside eval*** HPI***  Interventions   Assessment/Plan: 500 mL bolus Repeat Lactic Continue Abx    *** professional thanks      To reach the provider On-Call:   7AM- 7PM see care teams to locate the attending and reach out to them via [email or web address] Password: [email or web address] 7PM-7AM contact night-coverage If you still have difficulty reaching the appropriate provider, please page the [facility] (Director on Call) for [facility] on amion for assistance  This document was prepared using Conservation officer, historic buildings and may include unintentional dictation errors.  Bishop Limbo DNP, MBA, FNP-BC, PMHNP-BC Nurse Practitioner [facility] Pager [phone]

## 2022-11-05 NOTE — ED Notes (Signed)
Call to dietary as Ensure was not brought to pt

## 2022-11-05 NOTE — ED Notes (Signed)
Call to pt daughter [name] to update that room was assigned

## 2022-11-05 NOTE — ED Notes (Addendum)
Dr. Lyn Hollingshead notified that the fluids LR @150ml /h and NS@100ml /h were held during the night.  Fluids NS @100ml /h started this am.  Await further orders

## 2022-11-05 NOTE — ED Notes (Signed)
Meal tray given 

## 2022-11-05 NOTE — Consult Note (Signed)
WOC Nurse Consult Note: Reason for Consult: history of pressure injuries Patient admitted from [facility] with dehydration and AKI Wound type: History of Stage 4 Pressure injury; sacrum. Noted per ED staff to have sacral and buttock open wounds in same region Pressure Injury POA: Yes Measurement: see nursing flow sheets Wound bed: discussed with bedside nursing; actually related to moisture/incontinence  Drainage (amount, consistency, odor) none Periwound: intact  Dressing procedure/placement/frequency: Add Gerhardt's butt cream BID and PRN after each episode of incontinence  Discussed POC with patient Re consult if needed, will not follow at this time. Thanks  Vic Esco [employer], RN,CWOCN, CNS, CWON-AP [phone])

## 2022-11-05 NOTE — ED Notes (Signed)
Dr. Suzanna Obey with [facility] is pt PCP and she called and gave her cell number  [phone]  and office number [phone].  Pt is doing a respite stay while daughters who are very devoted  and would not eat and also began having diarrhea.  Her creatnine was 1.8 over a baseline of 1.5. Pt UA done and also increased hernia and she had imaging which showed illeus and she was given laxative.  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  (One on coccyx and one on left gluteus)

## 2022-11-05 NOTE — Hospital Course (Addendum)
Kaitlin Wagner is a 77 y.o. female with medical history significant for type diabetes mellitus, dementia, GERD, hypertension and dyslipidemia, followed by [facility] hospice, who presented to the emergency room with acute onset of intractable nausea, vomiting and diarrhea for the last 3 days. [place] renal function, recent UTI.  [facility] her creatinine was 7 UA showed nitrites and bacteria. Recommendation was for presentation to the ER given previous history of dehydration and severe AKI.  05/06: VSS, brief tachycardia/tachypnea, lactate normal, Cr 10.75, BUN 140, CO2 16, Cl 110. (+)UA for UTI. Renal US (+)Mild left hydronephrosis, new since prior examination, b/l nonobstructing nephrolithiasis, increased right renal cortical echogenicity c/w medical renal disease. Tx: 1 L bolus of IV NS, 2 g of IV Rocephin, IV LR 150 mL/h. Admitted to hospitalist service. Nephrology consulted. IV Bicarb drip for metabolic acidosis.  05/07: Cr increased to 10.62, BUN 144, WBC improved to 13.5. RN reported that fluids were not given overnight d/t confusion w/ orders and per report the admitting physician advised give bicarb and defer fluids, certainly may explain the lack of renal improvement. CT abd/pelvis show it's non obstructing hydronephrosis. D/c NS and run sodium bicarb at 150 mL/h. Nephrology to d/w family re: GOC dialysis but not indicated at this time.   Consultants:  Nephrology   Procedures: none     ASSESSMENT & PLAN:   Principal Problem:   Acute kidney injury superimposed on chronic kidney disease (HCC) Active Problems:   Acute gastroenteritis   Sepsis due to gram-negative UTI (HCC)   Metabolic acidosis   Diabetes mellitus type 2, uncomplicated (HCC)   Advanced dementia (HCC)   GERD (gastroesophageal reflux disease)   Essential (primary) hypertension   Depression   Acute renal failure (ARF) (HCC)   Protein-calorie malnutrition, severe   Aortic atherosclerosis  (HCC)   Intractable diarrhea   Urinary tract infection with hematuria   History of colon cancer   Hyperlipidemia   Neuropathy   Osteopenia   Polyneuropathy   Sleep apnea   Venous stasis dermatitis of both lower extremities   AKI on CKD4  D/t UTI/dehydration hypokalemia on chronic kidney disease stage IV with last known creatinine 2.79 and GFR of 17 on 08/16/21  Monitor BMP Hold nephrotoxic medications  Nephrology following D/c NS and run sodium bicarb at 150 mL/h.  Nephrology to d/w family re: GOC dialysis but not indicated at this time.   Acute gastroenteritis hydration as above, NS --> sodium bicarb as needed antiemetics and scheduled Reglan. advance diet as tolerated. Hold NSAID, ASA IV PPI therapy with Protonix.  Depression Lexapro and Remeron.  Type 2 diabetes mellitus with chronic kidney disease (HCC) SSI w/ NovoLog.  Sepsis due to gram-negative UTI (HCC) Sepsis manifested by tachypnea, tachycardia and leukocytosis but tachycardia and tachypnea were unsustained IV Rocephin. follow blood and urine cultures.  Metabolic acidosis IV bicarbonate drip. follow CO2/BMP    DVT prophylaxis: heparin Pertinent IV fluids/nutrition: NS 125 mL/h Central lines / invasive devices: none  Code Status: no CPR, ok with intubation if respiratory arrest - per H&P documentation  ACP documentation reviewed: 05/07 - DNR order and advanced directive   Current Admission Status: inpatient   TOC needs / Dispo plan: pending  Barriers to discharge / significant pending items: renal improvement, may need dialysis, anticipate will be here at least several days

## 2022-11-05 NOTE — ED Notes (Signed)
Pt nurse from [facility] [place] [facility] is at the bedside.  Pt has eaten minimal beyond what I had fed her when I set up.  Declines any more breakfast.  Nephrology is at the bedside

## 2022-11-05 NOTE — Assessment & Plan Note (Signed)
-   She will be placed on IV bicarbonate drip. - Will follow her CO2.

## 2022-11-05 NOTE — ED Notes (Signed)
Feeding pt lunch, she refuses the meat, taking mashed potatoes and some corn

## 2022-11-05 NOTE — ED Notes (Signed)
Pt cleaned and placed in new gown, sheets changed.  Barrier cream applied liberally to buttock, coccyx and gluteal fold as wells as labia due to moisture and skin breakdown.

## 2022-11-05 NOTE — Progress Notes (Signed)
[facility]  ROUNDING NOTE   Subjective:   Kaitlin Wagner is a 77 y.o. female with a past medical history of GERD, hypertension, diabetes, dementia and hyperlipidemia Patient presents to the ED with abnormal labs. She has been admitted for Acute kidney injury superimposed on chronic kidney disease (HCC) [N17.9, N18.9] Intractable diarrhea [R19.7] AKI (acute kidney injury) (HCC) [N17.9]  Patient is known to our practice from previous admissions. She is a Nurse, learning disability. According to [facility] nurse at bedside, patient has had some nausea and vomiting for the past few days. She attends there program during the day and it has been noted during their care. Her appetite has decreased. When obtaining labs, elevated creatinine prompted them to seek medical attention. Patient has dementia with little verbal interaction. She is able to nod yes/no to some questions. Nurse states she lives at home with family.   Labs on ED arrival significant for bicarb 16, glucose 120, BUN 140, creatinine 10.75 with GFR 3. Respiratory panel negative for RSV, flu and covid 19. Chest xray negative for acute findings. Renal US shows mild hydronephrosis and bilateral non obstructing nephrolithiasis. CT abd/pelvis confirm non obstructing stones without collecting system dilation. UA appears turbid with leukocytes.   We have been consulted to manage AKI.    Objective:  Vital signs in last 24 hours:  Temp:  [97.6 F (36.4 C)-98.2 F (36.8 C)] 98.2 F (36.8 C) (05/07 1200) Pulse Rate:  [67-84] 72 (05/07 1400) Resp:  [14-25] 17 (05/07 1400) BP: (108-165)/(37-118) 112/59 (05/07 1400) SpO2:  [93 %-100 %] 98 % (05/07 1400) Weight:  [67.1 kg] 67.1 kg (05/06 1625)  Weight change:  Filed Weights   11/04/22 1625  Weight: 67.1 kg    Intake/Output: No intake/output data recorded.   Intake/Output this shift:  Total I/O In: 333.4 [I.V.:333.4] Out: -   Physical Exam: General: NAD  Head: Normocephalic,  atraumatic. Dry oral mucosal membranes  Eyes: Anicteric  Lungs:  Clear to auscultation  Heart: Regular rate and rhythm  Abdomen:  Soft, nontender  Extremities:  No peripheral edema.  Neurologic: Alert, oriented to self, moving all four extremities  Skin: No lesions  Access: None    Basic Metabolic Panel: Recent Labs  Lab 11/04/22 1623 11/05/22 0611 11/05/22 1240  NA 140 141 140  K 4.5 4.0 3.2*  CL 110 110 108  CO2 16* 15* 16*  GLUCOSE 120* 152* 167*  BUN 140* 144* 124*  CREATININE 10.75* 10.62* 9.63*  CALCIUM 8.9 8.6* 7.9*    Liver Function Tests: Recent Labs  Lab 11/04/22 1623  AST 22  ALT 22  ALKPHOS 171*  BILITOT 0.7  PROT 8.6*  ALBUMIN 3.9   No results for input(s): "LIPASE", "AMYLASE" in the last 168 hours. No results for input(s): "AMMONIA" in the last 168 hours.  CBC: Recent Labs  Lab 11/04/22 1623 11/05/22 0611  WBC 15.8* 13.5*  NEUTROABS 13.1*  --   HGB 13.9 12.7  HCT 43.9 39.2  MCV 92.4 90.3  PLT 268 239    Cardiac Enzymes: Recent Labs  Lab 11/05/22 0610  CKTOTAL 217    BNP: Invalid input(s): "POCBNP"  CBG: Recent Labs  Lab 11/05/22 0206 11/05/22 0915 11/05/22 1146  GLUCAP 115* 116* 107*    Microbiology: Results for orders placed or performed during the hospital encounter of 11/04/22  Resp panel by RT-PCR (RSV, Flu A&B, Covid) Anterior Nasal Swab     Status: None   Collection Time: 11/04/22  4:25 PM  Specimen: Anterior Nasal Swab  Result Value Ref Range Status   SARS Coronavirus 2 by RT PCR NEGATIVE NEGATIVE Final    Comment: (NOTE) SARS-CoV-2 target nucleic acids are NOT DETECTED.  The SARS-CoV-2 RNA is generally detectable in upper respiratory specimens during the acute phase of infection. The lowest concentration of SARS-CoV-2 viral copies this assay can detect is 138 copies/mL. A negative result does not preclude SARS-Cov-2 infection and should not be used as the sole basis for treatment or other patient management  decisions. A negative result may occur with  improper specimen collection/handling, submission of specimen other than nasopharyngeal swab, presence of viral mutation(s) within the areas targeted by this assay, and inadequate number of viral copies(<138 copies/mL). A negative result must be combined with clinical observations, patient history, and epidemiological information. The expected result is Negative.  Fact Sheet for Patients:  BloggerCourse.com  Fact Sheet for Healthcare Providers:  SeriousBroker.it  This test is no t yet approved or cleared by the [place] FDA and  has been authorized for detection and/or diagnosis of SARS-CoV-2 by FDA under an Emergency Use Authorization (EUA). This EUA will remain  in effect (meaning this test can be used) for the duration of the COVID-19 declaration under Section 564(b)(1) of the Act, 21 U.S.C.section 360bbb-3(b)(1), unless the authorization is terminated  or revoked sooner.       Influenza A by PCR NEGATIVE NEGATIVE Final   Influenza B by PCR NEGATIVE NEGATIVE Final    Comment: (NOTE) The Xpert Xpress SARS-CoV-2/FLU/RSV plus assay is intended as an aid in the diagnosis of influenza from Nasopharyngeal swab specimens and should not be used as a sole basis for treatment. Nasal washings and aspirates are unacceptable for Xpert Xpress SARS-CoV-2/FLU/RSV testing.  Fact Sheet for Patients: BloggerCourse.com  Fact Sheet for Healthcare Providers: SeriousBroker.it  This test is not yet approved or cleared by the [place] FDA and has been authorized for detection and/or diagnosis of SARS-CoV-2 by FDA under an Emergency Use Authorization (EUA). This EUA will remain in effect (meaning this test can be used) for the duration of the COVID-19 declaration under Section 564(b)(1) of the Act, 21 U.S.C. section 360bbb-3(b)(1), unless the  authorization is terminated or revoked.     Resp Syncytial Virus by PCR NEGATIVE NEGATIVE Final    Comment: (NOTE) Fact Sheet for Patients: BloggerCourse.com  Fact Sheet for Healthcare Providers: SeriousBroker.it  This test is not yet approved or cleared by the [place] FDA and has been authorized for detection and/or diagnosis of SARS-CoV-2 by FDA under an Emergency Use Authorization (EUA). This EUA will remain in effect (meaning this test can be used) for the duration of the COVID-19 declaration under Section 564(b)(1) of the Act, 21 U.S.C. section 360bbb-3(b)(1), unless the authorization is terminated or revoked.  Performed at [facility], [address] [place], [place] [address]   Blood Culture (routine x 2)     Status: None (Preliminary result)   Collection Time: 11/04/22  8:33 PM   Specimen: BLOOD  Result Value Ref Range Status   Specimen Description BLOOD BLOOD LEFT HAND  Final   Special Requests   Final    BOTTLES DRAWN AEROBIC AND ANAEROBIC Blood Culture adequate volume   Culture   Final    NO GROWTH < 24 HOURS Performed at [facility], [address] [place], [place] [address]    Report Status PENDING  Incomplete  Blood Culture (routine x 2)     Status: None (Preliminary  result)   Collection Time: 11/04/22  8:35 PM   Specimen: BLOOD  Result Value Ref Range Status   Specimen Description BLOOD BLOOD RIGHT HAND  Final   Special Requests   Final    BOTTLES DRAWN AEROBIC AND ANAEROBIC Blood Culture results may not be optimal due to an inadequate volume of blood received in culture bottles   Culture   Final    NO GROWTH < 24 HOURS Performed at [facility], [address] [place], [place] [address]    Report Status PENDING  Incomplete    Coagulation Studies: Recent Labs    11/04/22 1623  LABPROT 16.6*  INR 1.4*    Urinalysis: Recent Labs    11/04/22 2250   COLORURINE YELLOW*  LABSPEC 1.010  PHURINE 5.0  GLUCOSEU NEGATIVE  HGBUR MODERATE*  BILIRUBINUR NEGATIVE  KETONESUR NEGATIVE  PROTEINUR 100*  NITRITE NEGATIVE  LEUKOCYTESUR LARGE*      Imaging: CT ABDOMEN PELVIS WO CONTRAST  Result Date: 11/05/2022 CLINICAL DATA:  UTI and AKI.  Hydronephrosis EXAM: CT ABDOMEN AND PELVIS WITHOUT CONTRAST TECHNIQUE: Multidetector CT imaging of the abdomen and pelvis was performed following the standard protocol without IV contrast. RADIATION DOSE REDUCTION: This exam was performed according to the departmental dose-optimization program which includes automated exposure control, adjustment of the mA and/or kV according to patient size and/or use of iterative reconstruction technique. COMPARISON:  Ultrasound 11/04/2022 and older.  CT scan 07/31/2021 FINDINGS: Lower chest: Breathing motion at the lung bases. Heart is enlarged. Coronary artery calcifications are seen. There is linear opacity at the bases likely scar or atelectasis. No pleural effusion. Hepatobiliary: Breathing motion. On this non IV contrast exam, the liver is grossly preserved. Previous cholecystectomy. Pancreas: Global moderate pancreatic atrophy.  No obvious mass. Spleen: Normal in size without focal abnormality. Adrenals/Urinary Tract: The adrenal glands are preserved. Significant motion. Bilateral nonobstructing stones. The largest is seen on the left side but size measurement is difficult with the motion. Prominent bilateral renal sinus fat. No collecting system dilatation. The ureters have normal course and caliber down to the bladder. Foley catheter in the nondilated urinary bladder. Stomach/Bowel: Stomach is nondilated. There are several air-filled loops of distended small and large bowel in the anterior abdomen with a few air-fluid levels but these are nondilated. Nonspecific. Appendix is not well seen. Vascular/Lymphatic: Scattered moderate to severe vascular calcifications. Normal caliber  aorta and IVC. No discrete abnormal lymph node enlargement identified in the abdomen and pelvis. There is one node along the external iliac chain which was prominent on the prior examination with a short axis of 8 mm. Today on series 2, image 65 this similar at 8 mm in short axis again. Reproductive: Uterus and bilateral adnexa are unremarkable. Other: Protuberant anterior abdominal wall with rectus muscle diastasis. Global muscle fatty atrophy. No free air or free fluid. Musculoskeletal: Degenerative changes seen diffusely of the spine and pelvis. Osteopenia. There is slight Schmorl's node deformity of the superior endplate of T11 which was not seen on the study of 2023. Please correlate for any history. Diffuse bridging osteophytes and syndesmophytes along the spine. IMPRESSION: Significant motion. Bilateral nonobstructing renal stones. No collecting system dilatation. Foley catheter in the bladder. Multiple distended loops of bowel in the anterior abdomen, both small and large bowel without obstruction or dilatation. Nonspecific. Few air-fluid levels. Stable protuberant anterior abdominal wall. Enlarged heart. Since the prior CT scan there is new mild compression of the superior endplate of T11 of uncertain etiology and exact  age. Please correlate with any known history and specific symptoms Electronically Signed   By: Karen Kays M.D.   On: 11/05/2022 11:27   US Renal  Result Date: 11/05/2022 CLINICAL DATA:  Acute renal failure EXAM: RENAL / URINARY TRACT ULTRASOUND COMPLETE COMPARISON:  CT and ultrasound 07/31/2021 FINDINGS: Right Kidney: Renal measurements: 10.2 x 4.5 x 4.2 cm = volume: 101 mL. Renal cortical thickness is preserved. Cortical echogenicity is diffusely increased in keeping with changes of underlying medical renal disease. 3 mm nonobstructing calculus noted within the lower pole of the right kidney. No hydronephrosis. No intrarenal masses are seen. An 18 mm simple cortical cyst is noted  within the upper pole of the right kidney. No follow-up imaging is recommended for this lesion. Left Kidney: Renal measurements: 10.5 x 5.5 x 5.0 cm = volume: 149 mL. Renal cortical thickness is preserved and cortical echogenicity is normal. Mild left hydronephrosis has developed. 10 mm nonobstructing calculus noted within the lower pole the left kidney. No intrarenal masses. Bladder: Appears normal for degree of bladder distention. Bilateral ureteral jets are identified Other: None. IMPRESSION: 1. Mild left hydronephrosis, new since prior examination. 2. Bilateral nonobstructing nephrolithiasis. 3. Diffusely increased right renal cortical echogenicity in keeping with changes of underlying medical renal disease. Electronically Signed   By: Helyn Numbers M.D.   On: 11/05/2022 00:19   DG Chest Port 1 View  Result Date: 11/04/2022 CLINICAL DATA:  Sepsis. EXAM: PORTABLE CHEST 1 VIEW COMPARISON:  October 20, 2020. FINDINGS: Mild cardiomegaly. Both lungs are clear. The visualized skeletal structures are unremarkable. IMPRESSION: No active disease. Electronically Signed   By: Lupita Raider M.D.   On: 11/04/2022 16:47     Medications:    cefTRIAXone (ROCEPHIN)  IV Stopped (11/04/22 1702)   sodium bicarbonate 150 mEq in dextrose 5 % 1,150 mL infusion 150 mL/hr at 11/05/22 1241    chlorhexidine  15 mL Mouth/Throat Daily   escitalopram  5 mg Oral Daily   feeding supplement  237 mL Oral BID BM   Gerhardt's butt cream   Topical BID   heparin injection (subcutaneous)  5,000 Units Subcutaneous Q8H   insulin aspart  0-5 Units Subcutaneous QHS   insulin aspart  0-9 Units Subcutaneous TID WC   metoCLOPramide (REGLAN) injection  5 mg Intravenous Q6H   mirtazapine  7.5 mg Oral QHS   pantoprazole (PROTONIX) IV  40 mg Intravenous Q12H   prenatal multivitamin  1 tablet Oral Daily   acetaminophen **OR** acetaminophen, magnesium hydroxide, ondansetron **OR** ondansetron (ZOFRAN) IV, senna-docusate,  traZODone  Assessment/ Plan:  [name] is a 77 y.o.  female with a past medical history of GERD, hypertension, diabetes, and hyperlipidemia. Patient has been admitted for Acute kidney injury superimposed on chronic kidney disease (HCC) [N17.9, N18.9] Intractable diarrhea [R19.7] AKI (acute kidney injury) (HCC) [N17.9]   Acute Kidney Injury with hypokalemia on chronic kidney disease stage IV with last known creatinine 2.79 and GFR of 17 on 08/16/21. Acute kidney injury secondary to hypovolemia. Recent history of persistent nausea and vomiting. Contributing factor may also include hydronephrosis seen on renal US. CT abd/pelvis show it's a non obstructing hydronephrosis.   Agree with IVF, will discontinue NS and continue sodium bicarb at increased rate of 153ml/hr. Will monitor renal function to determine what can be restored. No acute indication for dialysis but monitoring closely. Potassium 3.2, will order potassium x 2 IV. May have to speak with family about their wishes for dialysis, if  creatinine does not improve. Patient's with dialysis usually make poor long term dialysis candidates.  Lab Results  Component Value Date   CREATININE 9.63 (H) 11/05/2022   CREATININE 10.62 (H) 11/05/2022   CREATININE 10.75 (H) 11/04/2022    Intake/Output Summary (Last 24 hours) at 11/05/2022 1439 Last data filed at 11/05/2022 1037 Gross per 24 hour  Intake 333.42 ml  Output --  Net 333.42 ml   2. Anemia of chronic kidney disease Lab Results  Component Value Date   HGB 12.7 11/05/2022    Hgb within desired range. Will continue to monitor  3. Diabetes mellitus type II with chronic kidney disease/renal manifestations: noninsulin dependent.  Most recent hemoglobin A1c is 5.4 on 11/04/22.   4. Acute metabolic acidosis. Likely secondary to kidney injury. Continue sodium bicarb infusion at 172ml/hr.    LOS: 1   5/7/20242:39 PM

## 2022-11-06 LAB — BASIC METABOLIC PANEL
Anion gap: 13 (ref 5–15)
BUN: 128 mg/dL — ABNORMAL HIGH (ref 8–23)
CO2: 25 mmol/L (ref 22–32)
Calcium: 7.7 mg/dL — ABNORMAL LOW (ref 8.9–10.3)
Chloride: 105 mmol/L (ref 98–111)
Creatinine, Ser: 8.62 mg/dL — ABNORMAL HIGH (ref 0.44–1.00)
GFR, Estimated: 4 mL/min — ABNORMAL LOW (ref >60)
Glucose, Bld: 128 mg/dL — ABNORMAL HIGH (ref 70–99)
Potassium: 2.7 mmol/L — CL (ref 3.5–5.1)
Sodium: 143 mmol/L (ref 135–145)

## 2022-11-06 LAB — GLUCOSE, CAPILLARY
Glucose-Capillary: 107 mg/dL — ABNORMAL HIGH (ref 70–99)
Glucose-Capillary: 154 mg/dL — ABNORMAL HIGH (ref 70–99)
Glucose-Capillary: 84 mg/dL (ref 70–99)
Glucose-Capillary: 114 mg/dL — ABNORMAL HIGH (ref 70–99)

## 2022-11-06 LAB — CBC
HCT: 30.6 % — ABNORMAL LOW (ref 36.0–46.0)
Hemoglobin: 10.5 g/dL — ABNORMAL LOW (ref 12.0–15.0)
MCH: 29.6 pg (ref 26.0–34.0)
MCHC: 34.3 g/dL (ref 30.0–36.0)
MCV: 86.2 fL (ref 80.0–100.0)
Platelets: 207 10*3/uL (ref 150–400)
RBC: 3.55 MIL/uL — ABNORMAL LOW (ref 3.87–5.11)
RDW: 14.6 % (ref 11.5–15.5)
WBC: 8.9 10*3/uL (ref 4.0–10.5)
nRBC: 0.2 % (ref 0.0–0.2)

## 2022-11-06 LAB — LACTIC ACID, PLASMA: Lactic Acid, Venous: 1.3 mmol/L (ref 0.5–1.9)

## 2022-11-06 LAB — MAGNESIUM: Magnesium: 1.9 mg/dL (ref 1.7–2.4)

## 2022-11-06 MED ORDER — CHLORHEXIDINE GLUCONATE CLOTH 2 % EX PADS
6.0000 | MEDICATED_PAD | Freq: Every day | CUTANEOUS | Status: DC
  Administered 2022-11-06 – 2022-11-12 (×7): 6 via TOPICAL

## 2022-11-06 MED ORDER — POTASSIUM CHLORIDE 10 MEQ/100ML IV SOLN
10.0000 meq | INTRAVENOUS | Status: AC
  Administered 2022-11-06 (×4): 10 meq via INTRAVENOUS
  Filled 2022-11-06 (×3): qty 100

## 2022-11-06 MED ORDER — POTASSIUM CHLORIDE 20 MEQ PO PACK: 40.0000 meq | PACK | Freq: Two times a day (BID) | ORAL | Status: DC

## 2022-11-06 MED ORDER — SODIUM CHLORIDE 0.9 % IV SOLN: INTRAVENOUS | Status: DC

## 2022-11-06 NOTE — TOC Initial Note (Signed)
Transition of Care [facility]) - Initial/Assessment Note    Patient Details  Name: Kaitlin Wagner MRN: [id number] Date of Birth: [birth year]  [facility]) CM/SW Contact:    Chapman Fitch, RN Phone Number: 11/06/2022, 2:29 PM  Clinical Narrative:                  Patient followed by [facility] Spoke with Sharyne Peach at [place].  She states that patient is at [facility] for respite care and that she confirmed with [facility] provider that plan is to return Messages sent to [facility] to confirm VM left for daughter Ms Zigmund Daniel sent for signature         Patient Goals and CMS Choice            Expected Discharge Plan and Services                                              Prior Living Arrangements/Services                       Activities of Daily Living Home Assistive Devices/Equipment: Dan Humphreys (specify type), Oxygen ADL Screening (condition at time of admission) Patient's cognitive ability adequate to safely complete daily activities?: Yes Is the patient deaf or have difficulty hearing?: No Does the patient have difficulty seeing, even when wearing glasses/contacts?: No Does the patient have difficulty concentrating, remembering, or making decisions?: Yes Patient able to express need for assistance with ADLs?: Yes Does the patient have difficulty dressing or bathing?: Yes Independently performs ADLs?: No Communication: Independent Dressing (OT): Needs assistance Is this a change from baseline?: Pre-admission baseline Grooming: Independent Feeding: Independent Bathing: Needs assistance Is this a change from baseline?: Pre-admission baseline Toileting: Needs assistance Is this a change from baseline?: Pre-admission baseline In/Out Bed: Needs assistance Is this a change from baseline?: Pre-admission baseline Walks in Home: Needs assistance Is this a change from baseline?: Pre-admission baseline Does the patient have difficulty  walking or climbing stairs?: Yes Weakness of Legs: Both Weakness of Arms/Hands: None  Permission Sought/Granted                  Emotional Assessment              Admission diagnosis:  AKI (acute kidney injury) (HCC) [N17.9] Intractable diarrhea [R19.7] Urinary tract infection with hematuria, site unspecified [N39.0, R31.9] Acute renal failure, unspecified acute renal failure type (HCC) [N17.9] Acute kidney injury superimposed on chronic kidney disease (HCC) [N17.9, N18.9] Patient Active Problem List   Diagnosis Date Noted   Intractable diarrhea 11/05/2022   Metabolic acidosis 11/05/2022   Urinary tract infection with hematuria 11/05/2022   Hyperlipidemia 11/05/2022   AKI (acute kidney injury) (HCC) 11/05/2022   Acute kidney injury superimposed on chronic kidney disease (HCC) 11/04/2022   Acute gastroenteritis 11/04/2022   Diabetes mellitus type 2, uncomplicated (HCC) 11/04/2022   Sepsis due to gram-negative UTI (HCC) 11/04/2022   Thrombocytopenia (HCC) 08/05/2021   Aortic atherosclerosis (HCC) 08/05/2021   Pressure injury of right buttock, stage 2 (HCC) 08/01/2021   Protein-calorie malnutrition, severe 08/01/2021   Distended abdomen 08/01/2021   Uremia 08/01/2021   Acute renal failure (ARF) (HCC) 07/31/2021   DNR (do not resuscitate)/DNI(Do Not Intubate) 07/31/2021   Depression    Advanced dementia (HCC) 10/20/2020   GERD (  gastroesophageal reflux disease) 10/20/2020   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 [place] [address] Phone: [phone] Fax: [phone]     Social Determinants of Health (SDOH) Social History: SDOH Screenings   Tobacco Use: Low Risk  (11/05/2022)   SDOH Interventions:     Readmission Risk Interventions     No data to display

## 2022-11-06 NOTE — [place] FL2 (Signed)
[place] MEDICAID FL2 LEVEL OF CARE FORM     IDENTIFICATION  Patient Name: Kaitlin Wagner Birthdate: 07-Apr-1946 Sex: female Admission Date (Current Location): 11/04/2022  [facility] and [place] Number:  Chiropodist and Address:         Provider Number: [id number]  Attending Physician Name and Address:  Loyce Dys, MD  Relative Name and Phone Number:       Current Level of Care: Hospital Recommended Level of Care: Nursing Facility Prior Approval Number:    Date Approved/Denied:   PASRR Number: [id number]  Discharge Plan: SNF    Current Diagnoses: Patient Active Problem List   Diagnosis Date Noted   Intractable diarrhea 11/05/2022   Metabolic acidosis 11/05/2022   Urinary tract infection with hematuria 11/05/2022   Hyperlipidemia 11/05/2022   AKI (acute kidney injury) (HCC) 11/05/2022   Acute kidney injury superimposed on chronic kidney disease (HCC) 11/04/2022   Acute gastroenteritis 11/04/2022   Diabetes mellitus type 2, uncomplicated (HCC) 11/04/2022   Sepsis due to gram-negative UTI (HCC) 11/04/2022   Thrombocytopenia (HCC) 08/05/2021   Aortic atherosclerosis (HCC) 08/05/2021   Pressure injury of right buttock, stage 2 (HCC) 08/01/2021   Protein-calorie malnutrition, severe 08/01/2021   Distended abdomen 08/01/2021   Uremia 08/01/2021   Acute renal failure (ARF) (HCC) 07/31/2021   DNR (do not resuscitate)/DNI(Do Not Intubate) 07/31/2021   Depression    Advanced dementia (HCC) 10/20/2020   GERD (gastroesophageal reflux disease) 10/20/2020   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   Iron deficiency anemia, unspecified 04/13/2014   Neuropathy 04/13/2014   Osteopenia 04/13/2014   Other specified disorders of bone density and structure, unspecified site 04/13/2014   Polyneuropathy 04/13/2014   Sleep apnea 04/13/2014   Vitamin D deficiency 04/13/2014   Essential (primary) hypertension 03/04/2014   History of colon cancer 03/04/2014   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/04/2014   Venous stasis dermatitis of both lower extremities 03/04/2014    Orientation RESPIRATION BLADDER Height & Weight     Self, Time, Place  Normal Indwelling catheter Weight: 67.1 kg Height:     BEHAVIORAL SYMPTOMS/MOOD NEUROLOGICAL BOWEL NUTRITION STATUS      Incontinent Diet (Carb modified)  AMBULATORY STATUS COMMUNICATION OF NEEDS Skin   Total Care Verbally PU Stage and Appropriate Care                       Personal Care Assistance Level of Assistance              Functional Limitations Info             SPECIAL CARE FACTORS FREQUENCY                       Contractures Contractures Info: Not present    Additional Factors Info  Code Status, Allergies Code Status Info: DNR Allergies Info: NKDA           Current Medications (11/06/2022):  This is the current hospital active medication list Current Facility-Administered Medications  Medication Dose Route Frequency Provider Last Rate Last Admin   0.9 %  sodium chloride infusion   Intravenous Continuous Breeze, Gery Pray, NP 100 mL/hr at 11/06/22 1218 New Bag at 11/06/22 1218   acetaminophen (TYLENOL) tablet 650 mg  650 mg Oral Q6H PRN Mansy, Jan A, MD       Or   acetaminophen (TYLENOL) suppository 650 mg  650 mg Rectal Q6H PRN Mansy, Vernetta Honey, MD  cefTRIAXone (ROCEPHIN) 2 g in sodium chloride 0.9 % 100 mL IVPB  2 g Intravenous Q24H Merwyn Katos, MD   Stopped at 11/05/22 1718   chlorhexidine (PERIDEX) 0.12 % solution 15 mL  15 mL Mouth/Throat Daily Mansy, Jan A, MD   15 mL at 11/06/22 1103   Chlorhexidine Gluconate Cloth 2 % PADS 6 each  6 each Topical Daily Sunnie Nielsen, DO   6 each at 11/06/22 1101   escitalopram (LEXAPRO) tablet 5 mg  5 mg Oral Daily Mansy, Jan A, MD   5 mg at 11/06/22 1057   feeding supplement (ENSURE ENLIVE / ENSURE PLUS) liquid 237 mL  237 mL Oral BID BM Mansy, Jan A, MD   237 mL at 11/06/22 1325   Gerhardt's butt cream    Topical BID Sunnie Nielsen, DO   Given at 11/06/22 1059   heparin injection 5,000 Units  5,000 Units Subcutaneous Q8H Madueme, Elvira C, RPH   5,000 Units at 11/06/22 0515   insulin aspart (novoLOG) injection 0-5 Units  0-5 Units Subcutaneous QHS Mansy, Jan A, MD       insulin aspart (novoLOG) injection 0-9 Units  0-9 Units Subcutaneous TID WC Mansy, Jan A, MD   2 Units at 11/05/22 1722   magnesium hydroxide (MILK OF MAGNESIA) suspension 30 mL  30 mL Oral Daily PRN Mansy, Jan A, MD       metoCLOPramide (REGLAN) injection 5 mg  5 mg Intravenous Q6H Mansy, Jan A, MD   5 mg at 11/06/22 1221   mirtazapine (REMERON) tablet 7.5 mg  7.5 mg Oral QHS Mansy, Jan A, MD   7.5 mg at 11/05/22 2227   ondansetron (ZOFRAN) tablet 4 mg  4 mg Oral Q6H PRN Mansy, Jan A, MD       Or   ondansetron [facility]) injection 4 mg  4 mg Intravenous Q6H PRN Mansy, Jan A, MD       pantoprazole (PROTONIX) injection 40 mg  40 mg Intravenous Q12H Mansy, Jan A, MD   40 mg at 11/06/22 0909   prenatal multivitamin tablet 1 tablet  1 tablet Oral Daily Mansy, Jan A, MD   1 tablet at 11/05/22 1040   senna-docusate (Senokot-S) tablet 1 tablet  1 tablet Oral QHS PRN Mansy, Jan A, MD       traZODone (DESYREL) tablet 25 mg  25 mg Oral QHS PRN Mansy, Vernetta Honey, MD         Discharge Medications: Please see discharge summary for a list of discharge medications.  Relevant Imaging Results:  Relevant Lab Results:   Additional Information SS# [id number]  Chapman Fitch, RN

## 2022-11-06 NOTE — Progress Notes (Signed)
[facility]  ROUNDING NOTE   Subjective:   Kaitlin Wagner is a 77 y.o. female with a past medical history of GERD, hypertension, diabetes, dementia and hyperlipidemia Patient presents to the ED with abnormal labs. She has been admitted for AKI (acute kidney injury) (HCC) [N17.9] Intractable diarrhea [R19.7] Urinary tract infection with hematuria, site unspecified [N39.0, R31.9] Acute renal failure, unspecified acute renal failure type (HCC) [N17.9] Acute kidney injury superimposed on chronic kidney disease (HCC) [N17.9, N18.9]  Patient is known to our practice from previous admissions. She is a Nurse, learning disability.   Patient seen sitting up in bed, alert Breakfast tray at bedside, patient requires assistance with meals Patient states she feels well today Room air No lower extremity edema  Creatinine 8.62 Potassium 2.7   Objective:  Vital signs in last 24 hours:  Temp:  [97.7 F (36.5 C)-99.3 F (37.4 C)] 98.6 F (37 C) (05/08 0727) Pulse Rate:  [72-88] 88 (05/08 0727) Resp:  [17-20] 20 (05/08 0727) BP: (112-153)/(50-80) 130/51 (05/08 0727) SpO2:  [94 %-98 %] 94 % (05/08 0727)  Weight change:  Filed Weights   11/04/22 1625  Weight: 67.1 kg    Intake/Output: I/O last 3 completed shifts: In: 1670.1 [I.V.:933.4; IV Piggyback:736.7] Out: 1100 [Urine:1100]   Intake/Output this shift:  Total I/O In: 0  Out: 550 [Urine:550]  Physical Exam: General: NAD  Head: Normocephalic, atraumatic. Dry oral mucosal membranes  Eyes: Anicteric  Lungs:  Clear to auscultation  Heart: Regular rate and rhythm  Abdomen:  Soft, nontender  Extremities:  No peripheral edema.  Neurologic: Alert, oriented to self, moving all four extremities  Skin: No lesions  Access: None    Basic Metabolic Panel: Recent Labs  Lab 11/04/22 1623 11/05/22 0611 11/05/22 1240 11/06/22 0530  NA 140 141 140 143  K 4.5 4.0 3.2* 2.7*  CL 110 110 108 105  CO2 16* 15* 16* 25  GLUCOSE 120*  152* 167* 128*  BUN 140* 144* 124* 128*  CREATININE 10.75* 10.62* 9.63* 8.62*  CALCIUM 8.9 8.6* 7.9* 7.7*  MG  --   --   --  1.9     Liver Function Tests: Recent Labs  Lab 11/04/22 1623  AST 22  ALT 22  ALKPHOS 171*  BILITOT 0.7  PROT 8.6*  ALBUMIN 3.9    No results for input(s): "LIPASE", "AMYLASE" in the last 168 hours. No results for input(s): "AMMONIA" in the last 168 hours.  CBC: Recent Labs  Lab 11/04/22 1623 11/05/22 0611 11/06/22 0530  WBC 15.8* 13.5* 8.9  NEUTROABS 13.1*  --   --   HGB 13.9 12.7 10.5*  HCT 43.9 39.2 30.6*  MCV 92.4 90.3 86.2  PLT 268 239 207     Cardiac Enzymes: Recent Labs  Lab 11/05/22 0610  CKTOTAL 217     BNP: Invalid input(s): "POCBNP"  CBG: Recent Labs  Lab 11/05/22 1146 11/05/22 1717 11/05/22 2103 11/06/22 0726 11/06/22 1133  GLUCAP 107* 168* 103* 107* 154*     Microbiology: Results for orders placed or performed during the hospital encounter of 11/04/22  Resp panel by RT-PCR (RSV, Flu A&B, Covid) Anterior Nasal Swab     Status: None   Collection Time: 11/04/22  4:25 PM   Specimen: Anterior Nasal Swab  Result Value Ref Range Status   SARS Coronavirus 2 by RT PCR NEGATIVE NEGATIVE Final    Comment: (NOTE) SARS-CoV-2 target nucleic acids are NOT DETECTED.  The SARS-CoV-2 RNA is generally detectable in upper  respiratory specimens during the acute phase of infection. The lowest concentration of SARS-CoV-2 viral copies this assay can detect is 138 copies/mL. A negative result does not preclude SARS-Cov-2 infection and should not be used as the sole basis for treatment or other patient management decisions. A negative result may occur with  improper specimen collection/handling, submission of specimen other than nasopharyngeal swab, presence of viral mutation(s) within the areas targeted by this assay, and inadequate number of viral copies(<138 copies/mL). A negative result must be combined with clinical  observations, patient history, and epidemiological information. The expected result is Negative.  Fact Sheet for Patients:  BloggerCourse.com  Fact Sheet for Healthcare Providers:  SeriousBroker.it  This test is no t yet approved or cleared by the [place] FDA and  has been authorized for detection and/or diagnosis of SARS-CoV-2 by FDA under an Emergency Use Authorization (EUA). This EUA will remain  in effect (meaning this test can be used) for the duration of the COVID-19 declaration under Section 564(b)(1) of the Act, 21 U.S.C.section 360bbb-3(b)(1), unless the authorization is terminated  or revoked sooner.       Influenza A by PCR NEGATIVE NEGATIVE Final   Influenza B by PCR NEGATIVE NEGATIVE Final    Comment: (NOTE) The Xpert Xpress SARS-CoV-2/FLU/RSV plus assay is intended as an aid in the diagnosis of influenza from Nasopharyngeal swab specimens and should not be used as a sole basis for treatment. Nasal washings and aspirates are unacceptable for Xpert Xpress SARS-CoV-2/FLU/RSV testing.  Fact Sheet for Patients: BloggerCourse.com  Fact Sheet for Healthcare Providers: SeriousBroker.it  This test is not yet approved or cleared by the [place] FDA and has been authorized for detection and/or diagnosis of SARS-CoV-2 by FDA under an Emergency Use Authorization (EUA). This EUA will remain in effect (meaning this test can be used) for the duration of the COVID-19 declaration under Section 564(b)(1) of the Act, 21 U.S.C. section 360bbb-3(b)(1), unless the authorization is terminated or revoked.     Resp Syncytial Virus by PCR NEGATIVE NEGATIVE Final    Comment: (NOTE) Fact Sheet for Patients: BloggerCourse.com  Fact Sheet for Healthcare Providers: SeriousBroker.it  This test is not yet approved or cleared by  the [place] FDA and has been authorized for detection and/or diagnosis of SARS-CoV-2 by FDA under an Emergency Use Authorization (EUA). This EUA will remain in effect (meaning this test can be used) for the duration of the COVID-19 declaration under Section 564(b)(1) of the Act, 21 U.S.C. section 360bbb-3(b)(1), unless the authorization is terminated or revoked.  Performed at [facility], [address] [place], [place] [address]   Blood Culture (routine x 2)     Status: None (Preliminary result)   Collection Time: 11/04/22  8:33 PM   Specimen: BLOOD  Result Value Ref Range Status   Specimen Description BLOOD BLOOD LEFT HAND  Final   Special Requests   Final    BOTTLES DRAWN AEROBIC AND ANAEROBIC Blood Culture adequate volume   Culture   Final    NO GROWTH 2 DAYS Performed at [facility], [address] [place], [place] [address]    Report Status PENDING  Incomplete  Blood Culture (routine x 2)     Status: None (Preliminary result)   Collection Time: 11/04/22  8:35 PM   Specimen: BLOOD  Result Value Ref Range Status   Specimen Description BLOOD BLOOD RIGHT HAND  Final   Special Requests   Final    BOTTLES DRAWN AEROBIC AND  ANAEROBIC Blood Culture results may not be optimal due to an inadequate volume of blood received in culture bottles   Culture   Final    NO GROWTH 2 DAYS Performed at [facility], [address] [place], [place] [address]    Report Status PENDING  Incomplete    Coagulation Studies: Recent Labs    11/04/22 1623 11/05/22 1755  LABPROT 16.6* 16.4*  INR 1.4* 1.3*     Urinalysis: Recent Labs    11/04/22 2250  COLORURINE YELLOW*  LABSPEC 1.010  PHURINE 5.0  GLUCOSEU NEGATIVE  HGBUR MODERATE*  BILIRUBINUR NEGATIVE  KETONESUR NEGATIVE  PROTEINUR 100*  NITRITE NEGATIVE  LEUKOCYTESUR LARGE*       Imaging: CT ABDOMEN PELVIS WO CONTRAST  Result Date: 11/05/2022 CLINICAL DATA:  UTI and AKI.   Hydronephrosis EXAM: CT ABDOMEN AND PELVIS WITHOUT CONTRAST TECHNIQUE: Multidetector CT imaging of the abdomen and pelvis was performed following the standard protocol without IV contrast. RADIATION DOSE REDUCTION: This exam was performed according to the departmental dose-optimization program which includes automated exposure control, adjustment of the mA and/or kV according to patient size and/or use of iterative reconstruction technique. COMPARISON:  Ultrasound 11/04/2022 and older.  CT scan 07/31/2021 FINDINGS: Lower chest: Breathing motion at the lung bases. Heart is enlarged. Coronary artery calcifications are seen. There is linear opacity at the bases likely scar or atelectasis. No pleural effusion. Hepatobiliary: Breathing motion. On this non IV contrast exam, the liver is grossly preserved. Previous cholecystectomy. Pancreas: Global moderate pancreatic atrophy.  No obvious mass. Spleen: Normal in size without focal abnormality. Adrenals/Urinary Tract: The adrenal glands are preserved. Significant motion. Bilateral nonobstructing stones. The largest is seen on the left side but size measurement is difficult with the motion. Prominent bilateral renal sinus fat. No collecting system dilatation. The ureters have normal course and caliber down to the bladder. Foley catheter in the nondilated urinary bladder. Stomach/Bowel: Stomach is nondilated. There are several air-filled loops of distended small and large bowel in the anterior abdomen with a few air-fluid levels but these are nondilated. Nonspecific. Appendix is not well seen. Vascular/Lymphatic: Scattered moderate to severe vascular calcifications. Normal caliber aorta and IVC. No discrete abnormal lymph node enlargement identified in the abdomen and pelvis. There is one node along the external iliac chain which was prominent on the prior examination with a short axis of 8 mm. Today on series 2, image 65 this similar at 8 mm in short axis again.  Reproductive: Uterus and bilateral adnexa are unremarkable. Other: Protuberant anterior abdominal wall with rectus muscle diastasis. Global muscle fatty atrophy. No free air or free fluid. Musculoskeletal: Degenerative changes seen diffusely of the spine and pelvis. Osteopenia. There is slight Schmorl's node deformity of the superior endplate of T11 which was not seen on the study of 2023. Please correlate for any history. Diffuse bridging osteophytes and syndesmophytes along the spine. IMPRESSION: Significant motion. Bilateral nonobstructing renal stones. No collecting system dilatation. Foley catheter in the bladder. Multiple distended loops of bowel in the anterior abdomen, both small and large bowel without obstruction or dilatation. Nonspecific. Few air-fluid levels. Stable protuberant anterior abdominal wall. Enlarged heart. Since the prior CT scan there is new mild compression of the superior endplate of T11 of uncertain etiology and exact age. Please correlate with any known history and specific symptoms Electronically Signed   By: Karen Kays M.D.   On: 11/05/2022 11:27   US Renal  Result Date: 11/05/2022 CLINICAL DATA:  Acute renal failure  EXAM: RENAL / URINARY TRACT ULTRASOUND COMPLETE COMPARISON:  CT and ultrasound 07/31/2021 FINDINGS: Right Kidney: Renal measurements: 10.2 x 4.5 x 4.2 cm = volume: 101 mL. Renal cortical thickness is preserved. Cortical echogenicity is diffusely increased in keeping with changes of underlying medical renal disease. 3 mm nonobstructing calculus noted within the lower pole of the right kidney. No hydronephrosis. No intrarenal masses are seen. An 18 mm simple cortical cyst is noted within the upper pole of the right kidney. No follow-up imaging is recommended for this lesion. Left Kidney: Renal measurements: 10.5 x 5.5 x 5.0 cm = volume: 149 mL. Renal cortical thickness is preserved and cortical echogenicity is normal. Mild left hydronephrosis has developed. 10 mm  nonobstructing calculus noted within the lower pole the left kidney. No intrarenal masses. Bladder: Appears normal for degree of bladder distention. Bilateral ureteral jets are identified Other: None. IMPRESSION: 1. Mild left hydronephrosis, new since prior examination. 2. Bilateral nonobstructing nephrolithiasis. 3. Diffusely increased right renal cortical echogenicity in keeping with changes of underlying medical renal disease. Electronically Signed   By: Helyn Numbers M.D.   On: 11/05/2022 00:19   DG Chest Port 1 View  Result Date: 11/04/2022 CLINICAL DATA:  Sepsis. EXAM: PORTABLE CHEST 1 VIEW COMPARISON:  October 20, 2020. FINDINGS: Mild cardiomegaly. Both lungs are clear. The visualized skeletal structures are unremarkable. IMPRESSION: No active disease. Electronically Signed   By: Lupita Raider M.D.   On: 11/04/2022 16:47     Medications:    sodium chloride 100 mL/hr at 11/06/22 1218   cefTRIAXone (ROCEPHIN)  IV Stopped (11/05/22 1718)   potassium chloride 10 mEq (11/06/22 1323)    chlorhexidine  15 mL Mouth/Throat Daily   Chlorhexidine Gluconate Cloth  6 each Topical Daily   escitalopram  5 mg Oral Daily   feeding supplement  237 mL Oral BID BM   Gerhardt's butt cream   Topical BID   heparin injection (subcutaneous)  5,000 Units Subcutaneous Q8H   insulin aspart  0-5 Units Subcutaneous QHS   insulin aspart  0-9 Units Subcutaneous TID WC   metoCLOPramide (REGLAN) injection  5 mg Intravenous Q6H   mirtazapine  7.5 mg Oral QHS   pantoprazole (PROTONIX) IV  40 mg Intravenous Q12H   prenatal multivitamin  1 tablet Oral Daily   acetaminophen **OR** acetaminophen, magnesium hydroxide, ondansetron **OR** ondansetron (ZOFRAN) IV, senna-docusate, traZODone  Assessment/ Plan:  Ms. Brileigh Kramarz is a 77 y.o.  female with a past medical history of GERD, hypertension, diabetes, and hyperlipidemia. Patient has been admitted for AKI (acute kidney injury) (HCC) [N17.9] Intractable diarrhea  [R19.7] Urinary tract infection with hematuria, site unspecified [N39.0, R31.9] Acute renal failure, unspecified acute renal failure type (HCC) [N17.9] Acute kidney injury superimposed on chronic kidney disease (HCC) [N17.9, N18.9]   Acute Kidney Injury with hypokalemia on chronic kidney disease stage IV with last known creatinine 2.79 and GFR of 17 on 08/16/21. Acute kidney injury secondary to hypovolemia. Recent history of persistent nausea and vomiting. Contributing factor may also include hydronephrosis seen on renal US. CT abd/pelvis show it's a non obstructing hydronephrosis.   Creatinine has improved with IV fluids.  Potassium remains decreased, 2.7.  Will order potassium chloride 10 mill equivalents IV x 4.  Continue IV fluids, normal saline at 100 mL/h, for now. Lab Results  Component Value Date   CREATININE 8.62 (H) 11/06/2022   CREATININE 9.63 (H) 11/05/2022   CREATININE 10.62 (H) 11/05/2022    Intake/Output Summary (Last 24  hours) at 11/06/2022 1328 Last data filed at 11/06/2022 1057 Gross per 24 hour  Intake 1336.67 ml  Output 1650 ml  Net -313.33 ml    2. Anemia of chronic kidney disease Lab Results  Component Value Date   HGB 10.5 (L) 11/06/2022    Hemoglobin remains acceptable.  3. Diabetes mellitus type II with chronic kidney disease/renal manifestations: noninsulin dependent.  Most recent hemoglobin A1c is 5.4 on 11/04/22.   Glucose well-controlled.  4. Acute metabolic acidosis. Likely secondary to kidney injury.  Serum bicarb corrected to 25.  Will discontinue sodium bicarb infusion.   LOS: 2   5/8/20241:28 PM

## 2022-11-06 NOTE — Progress Notes (Addendum)
Progress Note   Patient: Kaitlin Wagner RUE:[id number] DOB: Nov 03, 1945 DOA: 11/04/2022     2 DOS: the patient was seen and examined on 11/06/2022    Subjective: Patient seen and examined at bedside this morning she denied nausea vomiting chest pain or cough Still has some generalized weakness Renal function worse today in the setting of low potassium levels Nephrology have initiated bicarb drip  Brief hospital course  From HPI: "Kaitlin Wagner is a 77 y.o. female with medical history significant for type diabetes mellitus, dementia, GERD, hypertension and dyslipidemia, followed by [facility] hospice, who presented to the emergency room with acute onset of intractable nausea, vomiting and diarrhea for the last 3 days.  She has been having deteriorating renal functions and associated UTI.  [facility] her creatinine was 7 UA showed nitrites and bacteria.  Recommendation was for presentation to the ER given previous history of dehydration and severe AKI.  She denies any bilious vomitus or hematemesis.  No melena or bright red bleeding per rectum or abdominal pain.  No chest pain or palpitations.  No cough or wheezing or dyspnea.   ED Course: When she came to the ER, BP was 147/37 with otherwise normal vital signs.  Creatinine was 10.75 and a BUN of 140 with a CO2 of a glucose of 120 and 2 protein of 8.6 with alk phos of 171.  CBC showed leukocytosis of 15.8 with neutrophilia.  PT was 16.6 and INR 1.4.  Lactic acid was 0.9 and later 1.9. EKG as reviewed by me : EKG showed sinus rhythm with a rate of 83 with Q waves inferiorly anteroseptally. Imaging: Portable chest x-ray showed no acute cardiopulmonary disease."  Assessment and Plan:   Acute kidney injury superimposed on chronic kidney disease (HCC) stage IV I discussed the case with patient's geriatrician Dr. Arta Silence and patient's creatinine was 1.8 on 10/30/2022 -Continue telemetry monitoring - We will continue hydration  with IVf and bicarb - We will continue monitoring her BUN and creatinine. - Nephrology consulted     Acute gastroenteritis - We will continue hydration with IVF - She will be placed on as needed antiemetics and scheduled Reglan. - Will advance her diet as tolerated. - We will hold off her aspirin. - Continue PPI therapy with Protonix.   Sepsis due to gram-negative UTI (HCC) - Sepsis manifested by tachypnea, tachycardia and leukocytosis. - We will continue IV antibiotic therapy with Rocephin. - We will follow blood and urine cultures.   Metabolic acidosis - Continue IV bicarbonate drip. - Will follow her CO2.   Type 2 diabetes mellitus with chronic kidney disease (HCC) - Continue supplement coverage with NovoLog.   Depression - We will continue Lexapro and Remeron.     DVT prophylaxis: Subcutaneous heparin.  Advanced Care Planning:  Code Status: The patient is DNR and has an out of facility DNR form but wanted to be intubated if she has respiratory failure/arrest without cardiac arrest.  This was discussed with her daughter.  Disposition Plan: Back to previous home environment  Consults called: Nephrology.   Physical Exam: GENERAL:  77 y.o.-year-old female patient lying in the bed with no acute distress.  EYES: Pupils equal, round, reactive to light and accommodation. No scleral icterus. Extraocular muscles intact.  HEENT: Head atraumatic, normocephalic. Oropharynx and nasopharynx clear.  NECK:  Supple, no jugular venous distention. No thyroid enlargement, no tenderness.  LUNGS: Normal breath sounds bilaterally, no wheezing, rales,rhonchi or crepitation. No  use of accessory muscles of respiration.  CARDIOVASCULAR: Regular rate and rhythm, S1, S2 normal. No murmurs, rubs, or gallops.  ABDOMEN: Soft, nondistended, nontender. Bowel sounds present. No organomegaly.  She has a large ventral nontender hernia. EXTREMITIES: No pedal edema, cyanosis, or clubbing.  NEUROLOGIC:  Cranial nerves II through XII are intact. Muscle strength 5/5 in all extremities. Sensation intact. Gait not checked.  PSYCHIATRIC: The patient is alert and oriented x 3.  Normal affect and good eye contact. SKIN: No obvious rash, lesion, or ulcer.   Vitals:   11/05/22 1713 11/05/22 2011 11/06/22 0514 11/06/22 0727  BP: (!) 145/80 (!) 145/63 (!) 138/50 (!) 130/51  Pulse: 77 74 88 88  Resp: 18 20 20 20   Temp: 98.5 F (36.9 C) 98.2 F (36.8 C) 99.3 F (37.4 C) 98.6 F (37 C)  TempSrc: Oral Oral Oral   SpO2: 98% 98% 94% 94%  Weight:        Data Reviewed: Renal function has improved from 9-8.6 today with potassium level 2.7  Family Communication: None present at bedside  Disposition: Status is: Inpatient Still needing nephrology consultation in the setting of acute renal failure    Time spent: 55 minutes  Author: Loyce Dys, MD 11/06/2022 1:29 PM  For on call review [email or web address].

## 2022-11-06 NOTE — Progress Notes (Signed)
Per Zella Ball with [facility] Dr Arta Silence with [facility] requests that MD  call her with update after rounds [phone].  MD notified via secure chat

## 2022-11-07 LAB — CBC WITH DIFFERENTIAL/PLATELET
Abs Immature Granulocytes: 0.04 10*3/uL (ref 0.00–0.07)
Basophils Absolute: 0 10*3/uL (ref 0.0–0.1)
Basophils Relative: 0 %
Eosinophils Absolute: 0.2 10*3/uL (ref 0.0–0.5)
Eosinophils Relative: 2 %
HCT: 32.6 % — ABNORMAL LOW (ref 36.0–46.0)
Hemoglobin: 10.8 g/dL — ABNORMAL LOW (ref 12.0–15.0)
Immature Granulocytes: 0 %
Lymphocytes Relative: 16 %
Lymphs Abs: 1.6 10*3/uL (ref 0.7–4.0)
MCH: 29.4 pg (ref 26.0–34.0)
MCHC: 33.1 g/dL (ref 30.0–36.0)
MCV: 88.8 fL (ref 80.0–100.0)
Monocytes Absolute: 1.3 10*3/uL — ABNORMAL HIGH (ref 0.1–1.0)
Monocytes Relative: 13 %
Neutro Abs: 7.1 10*3/uL (ref 1.7–7.7)
Neutrophils Relative %: 69 %
Platelets: 202 10*3/uL (ref 150–400)
RBC: 3.67 MIL/uL — ABNORMAL LOW (ref 3.87–5.11)
RDW: 14.6 % (ref 11.5–15.5)
WBC: 10.3 10*3/uL (ref 4.0–10.5)
nRBC: 0 % (ref 0.0–0.2)

## 2022-11-07 LAB — BASIC METABOLIC PANEL
Anion gap: 13 (ref 5–15)
BUN: 112 mg/dL — ABNORMAL HIGH (ref 8–23)
CO2: 26 mmol/L (ref 22–32)
Calcium: 7.9 mg/dL — ABNORMAL LOW (ref 8.9–10.3)
Chloride: 105 mmol/L (ref 98–111)
Creatinine, Ser: 6.53 mg/dL — ABNORMAL HIGH (ref 0.44–1.00)
GFR, Estimated: 6 mL/min — ABNORMAL LOW (ref >60)
Glucose, Bld: 108 mg/dL — ABNORMAL HIGH (ref 70–99)
Potassium: 2.8 mmol/L — ABNORMAL LOW (ref 3.5–5.1)
Sodium: 144 mmol/L (ref 135–145)

## 2022-11-07 LAB — GLUCOSE, CAPILLARY
Glucose-Capillary: 102 mg/dL — ABNORMAL HIGH (ref 70–99)
Glucose-Capillary: 136 mg/dL — ABNORMAL HIGH (ref 70–99)
Glucose-Capillary: 113 mg/dL — ABNORMAL HIGH (ref 70–99)
Glucose-Capillary: 129 mg/dL — ABNORMAL HIGH (ref 70–99)

## 2022-11-07 LAB — CULTURE, BLOOD (ROUTINE X 2): Special Requests: ADEQUATE

## 2022-11-07 MED ORDER — POTASSIUM CHLORIDE 10 MEQ/100ML IV SOLN
10.0000 meq | INTRAVENOUS | Status: AC
  Administered 2022-11-07 (×4): 10 meq via INTRAVENOUS
  Filled 2022-11-07: qty 100

## 2022-11-07 MED ORDER — POTASSIUM CHLORIDE CRYS ER 20 MEQ PO TBCR
40.0000 meq | EXTENDED_RELEASE_TABLET | ORAL | Status: AC
  Administered 2022-11-07 (×2): 40 meq via ORAL
  Filled 2022-11-07 (×2): qty 2

## 2022-11-07 MED ORDER — PANTOPRAZOLE SODIUM 40 MG PO TBEC
40.0000 mg | DELAYED_RELEASE_TABLET | Freq: Two times a day (BID) | ORAL | Status: DC
  Administered 2022-11-07 – 2022-11-12 (×10): 40 mg via ORAL
  Filled 2022-11-07 (×10): qty 1

## 2022-11-07 NOTE — Care Management Important Message (Signed)
Important Message  Patient Details  Name: Kaitlin Wagner MRN: [id number] Date of Birth: March 06, 1946   Medicare Important Message Given:  Yes     Johnell Comings 11/07/2022, 12:48 PM

## 2022-11-07 NOTE — Progress Notes (Signed)
[facility]  ROUNDING NOTE   Subjective:   Kaitlin Wagner is a 77 y.o. female with a past medical history of GERD, hypertension, diabetes, dementia and hyperlipidemia Patient presents to the ED with abnormal labs. She has been admitted for AKI (acute kidney injury) (HCC) [N17.9] Intractable diarrhea [R19.7] Urinary tract infection with hematuria, site unspecified [N39.0, R31.9] Acute renal failure, unspecified acute renal failure type (HCC) [N17.9] Acute kidney injury superimposed on chronic kidney disease (HCC) [N17.9, N18.9]  Patient is known to our practice from previous admissions. She is a Nurse, learning disability.   Patient seen sitting up in bed Completed breakfast tray at bedside   Creatinine 6.53 Potassium 2.8   Objective:  Vital signs in last 24 hours:  Temp:  [98.3 F (36.8 C)-100.1 F (37.8 C)] 98.3 F (36.8 C) (05/09 0828) Pulse Rate:  [82-94] 91 (05/09 0457) Resp:  [16-20] 18 (05/09 0828) BP: (121-156)/(57-75) 121/75 (05/09 0828) SpO2:  [90 %-95 %] 90 % (05/09 0828)  Weight change:  Filed Weights   11/04/22 1625  Weight: 67.1 kg    Intake/Output: I/O last 3 completed shifts: In: 3030 [P.O.:360; I.V.:2170; IV Piggyback:500] Out: 3150 [Urine:3150]   Intake/Output this shift:  No intake/output data recorded.  Physical Exam: General: NAD  Head: Normocephalic, atraumatic. Dry oral mucosal membranes  Eyes: Anicteric  Lungs:  Clear to auscultation  Heart: Regular rate and rhythm  Abdomen:  Soft, nontender  Extremities:  No peripheral edema.  Neurologic: Alert, oriented to self, moving all four extremities  Skin: No lesions  Access: None    Basic Metabolic Panel: Recent Labs  Lab 11/04/22 1623 11/05/22 0611 11/05/22 1240 11/06/22 0530 11/07/22 0723  NA 140 141 140 143 144  K 4.5 4.0 3.2* 2.7* 2.8*  CL 110 110 108 105 105  CO2 16* 15* 16* 25 26  GLUCOSE 120* 152* 167* 128* 108*  BUN 140* 144* 124* 128* 112*  CREATININE 10.75* 10.62*  9.63* 8.62* 6.53*  CALCIUM 8.9 8.6* 7.9* 7.7* 7.9*  MG  --   --   --  1.9  --      Liver Function Tests: Recent Labs  Lab 11/04/22 1623  AST 22  ALT 22  ALKPHOS 171*  BILITOT 0.7  PROT 8.6*  ALBUMIN 3.9    No results for input(s): "LIPASE", "AMYLASE" in the last 168 hours. No results for input(s): "AMMONIA" in the last 168 hours.  CBC: Recent Labs  Lab 11/04/22 1623 11/05/22 0611 11/06/22 0530 11/07/22 0723  WBC 15.8* 13.5* 8.9 10.3  NEUTROABS 13.1*  --   --  7.1  HGB 13.9 12.7 10.5* 10.8*  HCT 43.9 39.2 30.6* 32.6*  MCV 92.4 90.3 86.2 88.8  PLT 268 239 207 202     Cardiac Enzymes: Recent Labs  Lab 11/05/22 0610  CKTOTAL 217     BNP: Invalid input(s): "POCBNP"  CBG: Recent Labs  Lab 11/06/22 1133 11/06/22 1729 11/06/22 2058 11/07/22 0827 11/07/22 1130  GLUCAP 154* 84 114* 102* 136*     Microbiology: Results for orders placed or performed during the hospital encounter of 11/04/22  Resp panel by RT-PCR (RSV, Flu A&B, Covid) Anterior Nasal Swab     Status: None   Collection Time: 11/04/22  4:25 PM   Specimen: Anterior Nasal Swab  Result Value Ref Range Status   SARS Coronavirus 2 by RT PCR NEGATIVE NEGATIVE Final    Comment: (NOTE) SARS-CoV-2 target nucleic acids are NOT DETECTED.  The SARS-CoV-2 RNA is generally detectable  in upper respiratory specimens during the acute phase of infection. The lowest concentration of SARS-CoV-2 viral copies this assay can detect is 138 copies/mL. A negative result does not preclude SARS-Cov-2 infection and should not be used as the sole basis for treatment or other patient management decisions. A negative result may occur with  improper specimen collection/handling, submission of specimen other than nasopharyngeal swab, presence of viral mutation(s) within the areas targeted by this assay, and inadequate number of viral copies(<138 copies/mL). A negative result must be combined with clinical observations,  patient history, and epidemiological information. The expected result is Negative.  Fact Sheet for Patients:  BloggerCourse.com  Fact Sheet for Healthcare Providers:  SeriousBroker.it  This test is no t yet approved or cleared by the [place] FDA and  has been authorized for detection and/or diagnosis of SARS-CoV-2 by FDA under an Emergency Use Authorization (EUA). This EUA will remain  in effect (meaning this test can be used) for the duration of the COVID-19 declaration under Section 564(b)(1) of the Act, 21 U.S.C.section 360bbb-3(b)(1), unless the authorization is terminated  or revoked sooner.       Influenza A by PCR NEGATIVE NEGATIVE Final   Influenza B by PCR NEGATIVE NEGATIVE Final    Comment: (NOTE) The Xpert Xpress SARS-CoV-2/FLU/RSV plus assay is intended as an aid in the diagnosis of influenza from Nasopharyngeal swab specimens and should not be used as a sole basis for treatment. Nasal washings and aspirates are unacceptable for Xpert Xpress SARS-CoV-2/FLU/RSV testing.  Fact Sheet for Patients: BloggerCourse.com  Fact Sheet for Healthcare Providers: SeriousBroker.it  This test is not yet approved or cleared by the [place] FDA and has been authorized for detection and/or diagnosis of SARS-CoV-2 by FDA under an Emergency Use Authorization (EUA). This EUA will remain in effect (meaning this test can be used) for the duration of the COVID-19 declaration under Section 564(b)(1) of the Act, 21 U.S.C. section 360bbb-3(b)(1), unless the authorization is terminated or revoked.     Resp Syncytial Virus by PCR NEGATIVE NEGATIVE Final    Comment: (NOTE) Fact Sheet for Patients: BloggerCourse.com  Fact Sheet for Healthcare Providers: SeriousBroker.it  This test is not yet approved or cleared by the [place] FDA and has been authorized for detection and/or diagnosis of SARS-CoV-2 by FDA under an Emergency Use Authorization (EUA). This EUA will remain in effect (meaning this test can be used) for the duration of the COVID-19 declaration under Section 564(b)(1) of the Act, 21 U.S.C. section 360bbb-3(b)(1), unless the authorization is terminated or revoked.  Performed at [facility], [address] [place], [place] [address]   Blood Culture (routine x 2)     Status: None (Preliminary result)   Collection Time: 11/04/22  8:33 PM   Specimen: BLOOD  Result Value Ref Range Status   Specimen Description BLOOD BLOOD LEFT HAND  Final   Special Requests   Final    BOTTLES DRAWN AEROBIC AND ANAEROBIC Blood Culture adequate volume   Culture   Final    NO GROWTH 3 DAYS Performed at [facility], [address] [place], [place] [address]    Report Status PENDING  Incomplete  Blood Culture (routine x 2)     Status: None (Preliminary result)   Collection Time: 11/04/22  8:35 PM   Specimen: BLOOD  Result Value Ref Range Status   Specimen Description BLOOD BLOOD RIGHT HAND  Final   Special Requests   Final    BOTTLES DRAWN  AEROBIC AND ANAEROBIC Blood Culture results may not be optimal due to an inadequate volume of blood received in culture bottles   Culture   Final    NO GROWTH 3 DAYS Performed at [facility], [address] [place], [place] [address]    Report Status PENDING  Incomplete    Coagulation Studies: Recent Labs    11/04/22 1623 11/05/22 1755  LABPROT 16.6* 16.4*  INR 1.4* 1.3*     Urinalysis: Recent Labs    11/04/22 2250  COLORURINE YELLOW*  LABSPEC 1.010  PHURINE 5.0  GLUCOSEU NEGATIVE  HGBUR MODERATE*  BILIRUBINUR NEGATIVE  KETONESUR NEGATIVE  PROTEINUR 100*  NITRITE NEGATIVE  LEUKOCYTESUR LARGE*       Imaging: No results found.   Medications:    sodium chloride 100 mL/hr at 11/07/22 1317   cefTRIAXone  (ROCEPHIN)  IV 2 g (11/06/22 1722)   potassium chloride 10 mEq (11/07/22 1311)    chlorhexidine  15 mL Mouth/Throat Daily   Chlorhexidine Gluconate Cloth  6 each Topical Daily   escitalopram  5 mg Oral Daily   feeding supplement  237 mL Oral BID BM   Gerhardt's butt cream   Topical BID   heparin injection (subcutaneous)  5,000 Units Subcutaneous Q8H   insulin aspart  0-5 Units Subcutaneous QHS   insulin aspart  0-9 Units Subcutaneous TID WC   metoCLOPramide (REGLAN) injection  5 mg Intravenous Q6H   mirtazapine  7.5 mg Oral QHS   pantoprazole  40 mg Oral BID   potassium chloride  40 mEq Oral Q4H   prenatal multivitamin  1 tablet Oral Daily   acetaminophen **OR** acetaminophen, magnesium hydroxide, ondansetron **OR** ondansetron (ZOFRAN) IV, senna-docusate, traZODone  Assessment/ Plan:  [name] is a 77 y.o.  female with a past medical history of GERD, hypertension, diabetes, and hyperlipidemia. Patient has been admitted for AKI (acute kidney injury) (HCC) [N17.9] Intractable diarrhea [R19.7] Urinary tract infection with hematuria, site unspecified [N39.0, R31.9] Acute renal failure, unspecified acute renal failure type (HCC) [N17.9] Acute kidney injury superimposed on chronic kidney disease (HCC) [N17.9, N18.9]   Acute Kidney Injury with hypokalemia on chronic kidney disease stage IV with last known creatinine 2.79 and GFR of 17 on 08/16/21. Acute kidney injury secondary to hypovolemia. Recent history of persistent nausea and vomiting. Contributing factor may also include hydronephrosis seen on renal US. CT abd/pelvis show it's a non obstructing hydronephrosis.   Renal function has continued to improve with IV fluids.  Potassium remains decreased, 2.8.  Will order an additional 40 mill equivalents of IV potassium.  Primary team has already ordered oral potassium twice daily.  Lab Results  Component Value Date   CREATININE 6.53 (H) 11/07/2022   CREATININE 8.62 (H) 11/06/2022    CREATININE 9.63 (H) 11/05/2022    Intake/Output Summary (Last 24 hours) at 11/07/2022 1332 Last data filed at 11/07/2022 0500 Gross per 24 hour  Intake 1930 ml  Output 2000 ml  Net -70 ml    2. Anemia of chronic kidney disease Lab Results  Component Value Date   HGB 10.8 (L) 11/07/2022    Hemoglobin stable  3. Diabetes mellitus type II with chronic kidney disease/renal manifestations: noninsulin dependent.  Most recent hemoglobin A1c is 5.4 on 11/04/22.   Glucose well-controlled.  4. Acute metabolic acidosis. Likely secondary to kidney injury.  Serum bicarb stable   LOS: 3   5/9/20241:32 PM

## 2022-11-07 NOTE — TOC Progression Note (Signed)
Transition of Care [facility]) - Progression Note    Patient Details  Name: Kaitlin Wagner MRN: [id number] Date of Birth: 02/13/1946  Transition of Care [facility]) CM/SW Contact  Chapman Fitch, RN Phone Number: 11/07/2022, 10:05 AM  Clinical Narrative:        [name] confirmed patient is there for respite under [facility], and the plan is to return.  Secure message sent to MD to determine when it is anticipate patient will be ready to return to [facility].  Have not received return call from patients daughter [name]     Expected Discharge Plan and Services                                               Social Determinants of Health (SDOH) Interventions SDOH Screenings   Tobacco Use: Low Risk  (11/05/2022)    Readmission Risk Interventions     No data to display

## 2022-11-07 NOTE — Progress Notes (Signed)
Progress Note   Patient: Kaitlin Wagner ZOX:[id number] DOB: [birth year] DOA: 11/04/2022     3 DOS: the patient was seen and examined on 11/07/2022      Subjective: Patient seen and examined at bedside this morning she denied nausea vomiting chest pain or cough Renal function continues to improve Potassium noted to be low which is being repleted Nephrology have initiated bicarb drip   Brief hospital course   From HPI: "Kaitlin Wagner is a 77 y.o. female with medical history significant for type diabetes mellitus, dementia, GERD, hypertension and dyslipidemia, followed by [facility] hospice, who presented to the emergency room with acute onset of intractable nausea, vomiting and diarrhea for the last 3 days.  She has been having deteriorating renal functions and associated UTI.  [facility] her creatinine was 7 UA showed nitrites and bacteria.  Recommendation was for presentation to the ER given previous history of dehydration and severe AKI.  She denies any bilious vomitus or hematemesis.  No melena or bright red bleeding per rectum or abdominal pain.  No chest pain or palpitations.  No cough or wheezing or dyspnea.   ED Course: When she came to the ER, BP was 147/37 with otherwise normal vital signs.  Creatinine was 10.75 and a BUN of 140 with a CO2 of a glucose of 120 and 2 protein of 8.6 with alk phos of 171.  CBC showed leukocytosis of 15.8 with neutrophilia.  PT was 16.6 and INR 1.4.  Lactic acid was 0.9 and later 1.9. EKG as reviewed by me : EKG showed sinus rhythm with a rate of 83 with Q waves inferiorly anteroseptally. 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 Arta Silence on 11/06/2022 and patient's creatinine was 1.8 on 10/30/2022 -Continue telemetry monitoring - We will continue  hydration with IVf and bicarb - We will continue monitoring her BUN and creatinine. - Nephrology on board we appreciate input     Acute gastroenteritis - We will continue hydration with IVF - She will be placed on as needed antiemetics and scheduled Reglan. - Will advance her diet as tolerated. - We will hold off her aspirin. - Continue PPI therapy with Protonix.   Hypokalemia-continue potassium repletion and monitoring  Sepsis due to gram-negative UTI (HCC) - Sepsis manifested by tachypnea, tachycardia and leukocytosis. - We will continue IV antibiotic therapy with Rocephin. - We will follow blood and urine cultures.   Metabolic acidosis - Continue IV bicarbonate drip. - Will follow her CO2.   Type 2 diabetes mellitus with chronic kidney disease (HCC) - Continue supplement coverage with NovoLog.   Depression - We will continue Lexapro and Remeron.     DVT prophylaxis: Subcutaneous heparin.   Advanced Care Planning:  Code Status: The patient is DNR and has an out of facility DNR form but wanted to be intubated if she has respiratory failure/arrest without cardiac arrest.     Disposition Plan: Back to previous home environment   Consults called: Nephrology.     Physical Exam: GENERAL:  77 y.o.-year-old female patient lying in the bed with no acute distress.  EYES: Pupils equal, round, reactive to light and accommodation. No scleral icterus. Extraocular muscles intact.  HEENT: Head atraumatic, normocephalic. Oropharynx and nasopharynx clear.  NECK:  Supple, no jugular venous distention. No  thyroid enlargement, no tenderness.  LUNGS: Normal breath sounds bilaterally, no wheezing, rales,rhonchi or crepitation. No use of accessory muscles of respiration.  CARDIOVASCULAR: Regular rate and rhythm, S1, S2 normal. No murmurs, rubs, or gallops.  ABDOMEN: Soft, nondistended, nontender. Bowel sounds present. No organomegaly.  She has a large ventral nontender hernia. EXTREMITIES:  No pedal edema, cyanosis, or clubbing.  NEUROLOGIC: Cranial nerves II through XII are intact. Muscle strength 5/5 in all extremities. Sensation intact. Gait not checked.  PSYCHIATRIC: The patient is alert and oriented x 3.  Normal affect and good eye contact. SKIN: No obvious rash, lesion, or ulcer.      Data Reviewed: Personally reviewed the patient's laboratory results today Showing potassium of 2.8 sodium 144 with creatinine 6.5  Family Communication: None present at bedside   Disposition: Status is: Inpatient Still needing nephrology consultation in the setting of acute renal failure       Time spent: 50 minutes    Vitals:   11/06/22 2054 11/06/22 2241 11/07/22 0457 11/07/22 0828  BP: (!) 156/71  (!) 128/57 121/75  Pulse: 94  91   Resp: 16  18 18   Temp: 98.5 F (36.9 C)  100.1 F (37.8 C) 98.3 F (36.8 C)  TempSrc: Oral  Oral   SpO2: 95%  92% 90%  Weight:      Height:  5\' 4"  (1.626 m)       Author: Loyce Dys, MD 11/07/2022 2:01 PM  For on call review [email or web address].

## 2022-11-07 NOTE — Progress Notes (Signed)
PHARMACIST - PHYSICIAN COMMUNICATION  DR:  Meriam Sprague  CONCERNING: IV to Oral Route Change Policy  RECOMMENDATION: This patient is receiving pantoprazole by the intravenous route.  Based on criteria approved by the Pharmacy and Therapeutics Committee, the intravenous medication(s) is/are being converted to the equivalent oral dose form(s).   DESCRIPTION: These criteria include: The patient is eating (either orally or via tube) and/or has been taking other orally administered medications for a least 24 hours The patient has no evidence of active gastrointestinal bleeding or impaired GI absorption (gastrectomy, short bowel, patient on TNA or NPO).  If you have questions about this conversion, please contact the Pharmacy Department  []   [phone] )  [facility] [x]   [phone] )  [facility] []   [phone] )  [facility] []   [phone] )  [facility] []   [phone] )  [facility]   Elliot Gurney, PharmD, BCPS Clinical Pharmacist  11/07/2022 9:27 AM

## 2022-11-08 LAB — CBC WITH DIFFERENTIAL/PLATELET
Abs Immature Granulocytes: 0.08 10*3/uL — ABNORMAL HIGH (ref 0.00–0.07)
Basophils Absolute: 0.1 10*3/uL (ref 0.0–0.1)
Basophils Relative: 0 %
Eosinophils Absolute: 0.3 10*3/uL (ref 0.0–0.5)
Eosinophils Relative: 2 %
HCT: 32.4 % — ABNORMAL LOW (ref 36.0–46.0)
Hemoglobin: 10.4 g/dL — ABNORMAL LOW (ref 12.0–15.0)
Immature Granulocytes: 1 %
Lymphocytes Relative: 15 %
Lymphs Abs: 1.8 10*3/uL (ref 0.7–4.0)
MCH: 29.6 pg (ref 26.0–34.0)
MCHC: 32.1 g/dL (ref 30.0–36.0)
MCV: 92.3 fL (ref 80.0–100.0)
Monocytes Absolute: 1.3 10*3/uL — ABNORMAL HIGH (ref 0.1–1.0)
Monocytes Relative: 12 %
Neutro Abs: 8.1 10*3/uL — ABNORMAL HIGH (ref 1.7–7.7)
Neutrophils Relative %: 70 %
Platelets: 185 10*3/uL (ref 150–400)
RBC: 3.51 MIL/uL — ABNORMAL LOW (ref 3.87–5.11)
RDW: 14.8 % (ref 11.5–15.5)
WBC: 11.6 10*3/uL — ABNORMAL HIGH (ref 4.0–10.5)
nRBC: 0 % (ref 0.0–0.2)

## 2022-11-08 LAB — GLUCOSE, CAPILLARY
Glucose-Capillary: 108 mg/dL — ABNORMAL HIGH (ref 70–99)
Glucose-Capillary: 100 mg/dL — ABNORMAL HIGH (ref 70–99)
Glucose-Capillary: 94 mg/dL (ref 70–99)
Glucose-Capillary: 78 mg/dL (ref 70–99)

## 2022-11-08 LAB — BASIC METABOLIC PANEL
Anion gap: 10 (ref 5–15)
BUN: 93 mg/dL — ABNORMAL HIGH (ref 8–23)
CO2: 25 mmol/L (ref 22–32)
Calcium: 8.4 mg/dL — ABNORMAL LOW (ref 8.9–10.3)
Chloride: 114 mmol/L — ABNORMAL HIGH (ref 98–111)
Creatinine, Ser: 5.31 mg/dL — ABNORMAL HIGH (ref 0.44–1.00)
GFR, Estimated: 8 mL/min — ABNORMAL LOW (ref >60)
Glucose, Bld: 100 mg/dL — ABNORMAL HIGH (ref 70–99)
Potassium: 3.9 mmol/L (ref 3.5–5.1)
Sodium: 149 mmol/L — ABNORMAL HIGH (ref 135–145)

## 2022-11-08 LAB — CULTURE, BLOOD (ROUTINE X 2)

## 2022-11-08 MED ORDER — METOPROLOL TARTRATE 5 MG/5ML IV SOLN
5.0000 mg | Freq: Once | INTRAVENOUS | Status: AC
  Administered 2022-11-08: 5 mg via INTRAVENOUS

## 2022-11-08 MED ORDER — ACETAMINOPHEN 650 MG RE SUPP: 650.0000 mg | Freq: Four times a day (QID) | RECTAL | Status: DC | PRN

## 2022-11-08 MED ORDER — ACETAMINOPHEN 500 MG PO TABS
1000.0000 mg | ORAL_TABLET | Freq: Four times a day (QID) | ORAL | Status: DC | PRN
  Administered 2022-11-08: 1000 mg via ORAL
  Filled 2022-11-08: qty 2

## 2022-11-08 MED ORDER — ACETAMINOPHEN 500 MG PO TABS
ORAL_TABLET | ORAL | Status: AC
  Filled 2022-11-08: qty 2

## 2022-11-08 MED ORDER — LACTATED RINGERS IV SOLN: INTRAVENOUS | Status: DC

## 2022-11-08 NOTE — TOC Progression Note (Signed)
Transition of Care [facility]) - Progression Note    Patient Details  Name: Kaitlin Wagner MRN: [id number] Date of Birth: Nov 04, 1945  [facility] of [facility]) CM/SW Contact  Chapman Fitch, RN Phone Number: 11/08/2022, 1:46 PM  Clinical Narrative:     Per MD patient not medically stable for discharge.  Per Charisse March with [facility] if patient is not ready to discharge today then she will be unable to discharge and return to [facility] over the weekend        Expected Discharge Plan and Services                                               Social Determinants of Health (SDOH) Interventions SDOH Screenings   Tobacco Use: Low Risk  (11/05/2022)    Readmission Risk Interventions     No data to display

## 2022-11-08 NOTE — Progress Notes (Signed)
[facility]  ROUNDING NOTE   Subjective:   Kaitlin Wagner is a 77 y.o. female with a past medical history of GERD, hypertension, diabetes, dementia and hyperlipidemia Patient presents to the ED with abnormal labs. She has been admitted for AKI (acute kidney injury) (HCC) [N17.9] Intractable diarrhea [R19.7] Urinary tract infection with hematuria, site unspecified [N39.0, R31.9] Acute renal failure, unspecified acute renal failure type (HCC) [N17.9] Acute kidney injury superimposed on chronic kidney disease (HCC) [N17.9, N18.9]  Patient is known to our practice from previous admissions. She is a Nurse, learning disability.   Patient seen sitting up in bed Denies nausea or vomiting Remains on room air Lower extremity edema  Creatinine 5.31 Potassium 3.9   Objective:  Vital signs in last 24 hours:  Temp:  [98.2 F (36.8 C)-99.1 F (37.3 C)] 98.2 F (36.8 C) (05/10 0958) Pulse Rate:  [77-152] 77 (05/10 0958) Resp:  [16-20] 18 (05/10 0958) BP: (90-145)/(53-69) 101/54 (05/10 0958) SpO2:  [94 %-99 %] 97 % (05/10 0958)  Weight change:  Filed Weights   11/04/22 1625  Weight: 67.1 kg    Intake/Output: I/O last 3 completed shifts: In: 4680 [P.O.:420; I.V.:4260] Out: 2850 [Urine:2850]   Intake/Output this shift:  No intake/output data recorded.  Physical Exam: General: NAD  Head: Normocephalic, atraumatic. Dry oral mucosal membranes  Eyes: Anicteric  Lungs:  Clear to auscultation  Heart: Regular rate and rhythm  Abdomen:  Soft, nontender  Extremities:  No peripheral edema.  Neurologic: Alert, oriented to self, moving all four extremities  Skin: No lesions  Access: None    Basic Metabolic Panel: Recent Labs  Lab 11/05/22 0611 11/05/22 1240 11/06/22 0530 11/07/22 0723 11/08/22 0421  NA 141 140 143 144 149*  K 4.0 3.2* 2.7* 2.8* 3.9  CL 110 108 105 105 114*  CO2 15* 16* 25 26 25   GLUCOSE 152* 167* 128* 108* 100*  BUN 144* 124* 128* 112* 93*  CREATININE  10.62* 9.63* 8.62* 6.53* 5.31*  CALCIUM 8.6* 7.9* 7.7* 7.9* 8.4*  MG  --   --  1.9  --   --      Liver Function Tests: Recent Labs  Lab 11/04/22 1623  AST 22  ALT 22  ALKPHOS 171*  BILITOT 0.7  PROT 8.6*  ALBUMIN 3.9    No results for input(s): "LIPASE", "AMYLASE" in the last 168 hours. No results for input(s): "AMMONIA" in the last 168 hours.  CBC: Recent Labs  Lab 11/04/22 1623 11/05/22 0611 11/06/22 0530 11/07/22 0723 11/08/22 0421  WBC 15.8* 13.5* 8.9 10.3 11.6*  NEUTROABS 13.1*  --   --  7.1 8.1*  HGB 13.9 12.7 10.5* 10.8* 10.4*  HCT 43.9 39.2 30.6* 32.6* 32.4*  MCV 92.4 90.3 86.2 88.8 92.3  PLT 268 239 207 202 185     Cardiac Enzymes: Recent Labs  Lab 11/05/22 0610  CKTOTAL 217     BNP: Invalid input(s): "POCBNP"  CBG: Recent Labs  Lab 11/07/22 0827 11/07/22 1130 11/07/22 1630 11/07/22 2155 11/08/22 0849  GLUCAP 102* 136* 113* 129* 108*     Microbiology: Results for orders placed or performed during the hospital encounter of 11/04/22  Resp panel by RT-PCR (RSV, Flu A&B, Covid) Anterior Nasal Swab     Status: None   Collection Time: 11/04/22  4:25 PM   Specimen: Anterior Nasal Swab  Result Value Ref Range Status   SARS Coronavirus 2 by RT PCR NEGATIVE NEGATIVE Final    Comment: (NOTE) SARS-CoV-2 target nucleic  acids are NOT DETECTED.  The SARS-CoV-2 RNA is generally detectable in upper respiratory specimens during the acute phase of infection. The lowest concentration of SARS-CoV-2 viral copies this assay can detect is 138 copies/mL. A negative result does not preclude SARS-Cov-2 infection and should not be used as the sole basis for treatment or other patient management decisions. A negative result may occur with  improper specimen collection/handling, submission of specimen other than nasopharyngeal swab, presence of viral mutation(s) within the areas targeted by this assay, and inadequate number of viral copies(<138 copies/mL). A  negative result must be combined with clinical observations, patient history, and epidemiological information. The expected result is Negative.  Fact Sheet for Patients:  BloggerCourse.com  Fact Sheet for Healthcare Providers:  SeriousBroker.it  This test is no t yet approved or cleared by the [place] FDA and  has been authorized for detection and/or diagnosis of SARS-CoV-2 by FDA under an Emergency Use Authorization (EUA). This EUA will remain  in effect (meaning this test can be used) for the duration of the COVID-19 declaration under Section 564(b)(1) of the Act, 21 U.S.C.section 360bbb-3(b)(1), unless the authorization is terminated  or revoked sooner.       Influenza A by PCR NEGATIVE NEGATIVE Final   Influenza B by PCR NEGATIVE NEGATIVE Final    Comment: (NOTE) The Xpert Xpress SARS-CoV-2/FLU/RSV plus assay is intended as an aid in the diagnosis of influenza from Nasopharyngeal swab specimens and should not be used as a sole basis for treatment. Nasal washings and aspirates are unacceptable for Xpert Xpress SARS-CoV-2/FLU/RSV testing.  Fact Sheet for Patients: BloggerCourse.com  Fact Sheet for Healthcare Providers: SeriousBroker.it  This test is not yet approved or cleared by the [place] FDA and has been authorized for detection and/or diagnosis of SARS-CoV-2 by FDA under an Emergency Use Authorization (EUA). This EUA will remain in effect (meaning this test can be used) for the duration of the COVID-19 declaration under Section 564(b)(1) of the Act, 21 U.S.C. section 360bbb-3(b)(1), unless the authorization is terminated or revoked.     Resp Syncytial Virus by PCR NEGATIVE NEGATIVE Final    Comment: (NOTE) Fact Sheet for Patients: BloggerCourse.com  Fact Sheet for Healthcare  Providers: SeriousBroker.it  This test is not yet approved or cleared by the [place] FDA and has been authorized for detection and/or diagnosis of SARS-CoV-2 by FDA under an Emergency Use Authorization (EUA). This EUA will remain in effect (meaning this test can be used) for the duration of the COVID-19 declaration under Section 564(b)(1) of the Act, 21 U.S.C. section 360bbb-3(b)(1), unless the authorization is terminated or revoked.  Performed at [facility], [address] [place], [place] [address]   Blood Culture (routine x 2)     Status: None (Preliminary result)   Collection Time: 11/04/22  8:33 PM   Specimen: BLOOD  Result Value Ref Range Status   Specimen Description BLOOD BLOOD LEFT HAND  Final   Special Requests   Final    BOTTLES DRAWN AEROBIC AND ANAEROBIC Blood Culture adequate volume   Culture   Final    NO GROWTH 4 DAYS Performed at [facility], [address] [place], [place] [address]    Report Status PENDING  Incomplete  Blood Culture (routine x 2)     Status: None (Preliminary result)   Collection Time: 11/04/22  8:35 PM   Specimen: BLOOD  Result Value Ref Range Status   Specimen Description BLOOD BLOOD RIGHT HAND  Final  Special Requests   Final    BOTTLES DRAWN AEROBIC AND ANAEROBIC Blood Culture results may not be optimal due to an inadequate volume of blood received in culture bottles   Culture   Final    NO GROWTH 4 DAYS Performed at [facility], [address] [place], [place] [address]    Report Status PENDING  Incomplete    Coagulation Studies: Recent Labs    11/05/22 1755  LABPROT 16.4*  INR 1.3*     Urinalysis: No results for input(s): "COLORURINE", "LABSPEC", "PHURINE", "GLUCOSEU", "HGBUR", "BILIRUBINUR", "KETONESUR", "PROTEINUR", "UROBILINOGEN", "NITRITE", "LEUKOCYTESUR" in the last 72 hours.  Invalid input(s): "APPERANCEUR"     Imaging: No results  found.   Medications:    cefTRIAXone (ROCEPHIN)  IV 2 g (11/07/22 1656)   lactated ringers 100 mL/hr at 11/08/22 1191    acetaminophen       chlorhexidine  15 mL Mouth/Throat Daily   Chlorhexidine Gluconate Cloth  6 each Topical Daily   escitalopram  5 mg Oral Daily   feeding supplement  237 mL Oral BID BM   Gerhardt's butt cream   Topical BID   heparin injection (subcutaneous)  5,000 Units Subcutaneous Q8H   insulin aspart  0-5 Units Subcutaneous QHS   insulin aspart  0-9 Units Subcutaneous TID WC   metoCLOPramide (REGLAN) injection  5 mg Intravenous Q6H   mirtazapine  7.5 mg Oral QHS   pantoprazole  40 mg Oral BID   prenatal multivitamin  1 tablet Oral Daily   acetaminophen, acetaminophen **OR** acetaminophen, magnesium hydroxide, ondansetron **OR** ondansetron (ZOFRAN) IV, senna-docusate, traZODone  Assessment/ Plan:  Kaitlin Wagner is a 77 y.o.  female with a past medical history of GERD, hypertension, diabetes, and hyperlipidemia. Patient has been admitted for AKI (acute kidney injury) (HCC) [N17.9] Intractable diarrhea [R19.7] Urinary tract infection with hematuria, site unspecified [N39.0, R31.9] Acute renal failure, unspecified acute renal failure type (HCC) [N17.9] Acute kidney injury superimposed on chronic kidney disease (HCC) [N17.9, N18.9]   Acute Kidney Injury with hypokalemia on chronic kidney disease stage IV with last known creatinine 2.79 and GFR of 17 on 08/16/21. Acute kidney injury secondary to hypovolemia. Recent history of persistent nausea and vomiting. Contributing factor may also include hydronephrosis seen on renal US. CT abd/pelvis show it's a non obstructing hydronephrosis.   Renal function continues to improve with IV fluids.  Patient also maintaining adequate oral intake.  Will continue fluids.  Will prefer creatinine closer to baseline before considering discharge.  Patient will need outpatient follow-up with nephrology at discharge.  Lab Results   Component Value Date   CREATININE 5.31 (H) 11/08/2022   CREATININE 6.53 (H) 11/07/2022   CREATININE 8.62 (H) 11/06/2022    Intake/Output Summary (Last 24 hours) at 11/08/2022 1217 Last data filed at 11/08/2022 0651 Gross per 24 hour  Intake 2990 ml  Output 850 ml  Net 2140 ml    2. Anemia of chronic kidney disease Lab Results  Component Value Date   HGB 10.4 (L) 11/08/2022    Hemoglobin at goal  3. Diabetes mellitus type II with chronic kidney disease/renal manifestations: noninsulin dependent.  Most recent hemoglobin A1c is 5.4 on 11/04/22.   Glucose well-controlled.  4. Acute metabolic acidosis. Likely secondary to kidney injury. Corrected   LOS: 4   5/10/202412:17 PM

## 2022-11-08 NOTE — Progress Notes (Signed)
Progress Note   Patient: Kaitlin Wagner NFA:[id number] DOB: [birth year] DOA: 11/04/2022     4 DOS: the patient was seen and examined on 11/08/2022    Subjective: Patient seen and examined at bedside this morning she denied nausea vomiting chest pain or cough Renal function improving Nephrology team on board and case discussed today   Brief hospital course   From HPI: "Kaitlin Wagner is a 77 y.o. female with medical history significant for type diabetes mellitus, dementia, GERD, hypertension and dyslipidemia, followed by [facility], who presented to the emergency room with acute onset of intractable nausea, vomiting and diarrhea for the last 3 days.  She has been having deteriorating renal functions and associated UTI.  [facility] her creatinine was 7 UA showed nitrites and bacteria.  Recommendation was for presentation to the ER given previous history of dehydration and severe AKI.  She denies any bilious vomitus or hematemesis.  No melena or bright red bleeding per rectum or abdominal pain.  No chest pain or palpitations.  No cough or wheezing or dyspnea.   ED Course: When she came to the ER, BP was 147/37 with otherwise normal vital signs.  Creatinine was 10.75 and a BUN of 140 with a CO2 of a glucose of 120 and 2 protein of 8.6 with alk phos of 171.  CBC showed leukocytosis of 15.8 with neutrophilia.  PT was 16.6 and INR 1.4.  Lactic acid was 0.9 and later 1.9. EKG as reviewed by me : EKG showed sinus rhythm with a rate of 83 with Q waves inferiorly anteroseptally. 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 Arta Silence on 11/06/2022 and patient's creatinine was 1.8 on 10/30/2022 -Continue telemetry monitoring - We will continue hydration with IVf  - We will continue monitoring her BUN  and creatinine. - Nephrology on board we appreciate input According to nephrologist they would like improvement in creatinine close to baseline before discharge     Acute gastroenteritis - We will continue hydration with IVF - She will be placed on as needed antiemetics and scheduled Reglan. - Will advance her diet as tolerated. - We will hold off her aspirin. - Continue PPI therapy with Protonix.   Large ventral hernia Outpatient follow-up No indication for emergent surgery at this time  Hypokalemia-continue potassium repletion and monitoring   Sepsis due to gram-negative UTI (HCC) - Sepsis manifested by tachypnea, tachycardia and leukocytosis. - We will continue IV antibiotic therapy with Rocephin. - We will follow blood and urine cultures.   Metabolic acidosis - Continue IV bicarbonate drip. - Will follow her CO2.   Type 2 diabetes mellitus with chronic kidney disease (HCC) - Continue supplement coverage with NovoLog.   Depression - We will continue Lexapro and Remeron.     DVT prophylaxis: Subcutaneous heparin.   Advanced Care Planning:  Code Status: The patient is DNR and has an out of facility DNR form but wanted to be intubated if she has respiratory failure/arrest without cardiac arrest.     Disposition Plan: Back to previous home environment   Consults called: Nephrology.     Physical Exam: GENERAL:  77 y.o.-year-old female patient lying in the bed with no acute distress.  EYES: Pupils equal, round, reactive to light and accommodation. No scleral icterus. Extraocular muscles intact.  HEENT: Head atraumatic, normocephalic. Oropharynx and nasopharynx clear.  NECK:  Supple, no jugular venous distention. No thyroid enlargement, no tenderness.  LUNGS: Normal breath sounds bilaterally, no wheezing, rales,rhonchi or crepitation. No use of accessory muscles of respiration.  CARDIOVASCULAR: Regular rate and rhythm, S1, S2 normal. No murmurs, rubs, or gallops.   ABDOMEN: Soft, nondistended, nontender. Bowel sounds present. No organomegaly.  She has a large ventral nontender hernia. EXTREMITIES: No pedal edema, cyanosis, or clubbing.  NEUROLOGIC: Cranial nerves II through XII are intact. Muscle strength 5/5 in all extremities. Sensation intact. Gait not checked.  PSYCHIATRIC: The patient is alert and oriented x 3.  Normal affect and good eye contact. SKIN: No obvious rash, lesion, or ulcer.        Data Reviewed: Personally reviewed the patient's laboratory results today Showing sodium 149 potassium 3.9 chloride 114 glucose handwrite creatinine was 5.3 WBC 11.6 hemoglobin 10.4   Family Communication: None present at bedside   Disposition: Status is: Inpatient Still needing nephrology consultation in the setting of acute renal failure       Time spent: 45 minutes        Vitals:   11/08/22 0810 11/08/22 0908 11/08/22 0958 11/08/22 1337  BP: (!) 90/55 (!) 93/53 (!) 101/54 (!) 124/54  Pulse: (!) 132 81 77 70  Resp: 16  18   Temp: 98.8 F (37.1 C) 98.5 F (36.9 C) 98.2 F (36.8 C)   TempSrc: Oral Oral    SpO2: 95% 99% 97% 98%  Weight:      Height:         Author: Loyce Dys, MD 11/08/2022 3:31 PM  For on call review [email or web address].

## 2022-11-08 NOTE — Progress Notes (Signed)
At 0600, morning vitals were obtained. All vitals normal, except for heart rate of 148 and mild temp of 99.1. Patient did not appear in distress and patient had no complaints. No heartburn, no chest pain, no SOB, no nausea, no lightheadedness, etc. Contacted NP Jon Billings with critical HR. NP ordered tylenol 1g to give and changed NS infusion to LR. NP also ordered EKG which showed wide QRS tachycardia, left axis deviation, intraventricular block, possible lateral infarct age undetermined. NP aware of EKG results, ordered OT metoprolol 5mg  IV. Post metoprolol HR dropped to 129. Attempted to page back with results but NP had signed off at that time for shift change. Updated charge nurse, gave report to day shift nurse. MEW score of 3.

## 2022-11-08 NOTE — NC FL2 (Signed)
[place] MEDICAID FL2 LEVEL OF CARE FORM     IDENTIFICATION  Patient Name: Kaitlin Wagner Birthdate: 1946/02/25 Sex: female Admission Date (Current Location): 11/04/2022  [facility] and [place] Number:  Chiropodist and Address:         Provider Number: [id number]  Attending Physician Name and Address:  Loyce Dys, MD  Relative Name and Phone Number:       Current Level of Care: Hospital Recommended Level of Care: Nursing Facility Prior Approval Number:    Date Approved/Denied:   PASRR Number: [id number]  Discharge Plan: SNF    Current Diagnoses: Patient Active Problem List   Diagnosis Date Noted   Intractable diarrhea 11/05/2022   Metabolic acidosis 11/05/2022   Urinary tract infection with hematuria 11/05/2022   Hyperlipidemia 11/05/2022   AKI (acute kidney injury) (HCC) 11/05/2022   Acute kidney injury superimposed on chronic kidney disease (HCC) 11/04/2022   Acute gastroenteritis 11/04/2022   Diabetes mellitus type 2, uncomplicated (HCC) 11/04/2022   Sepsis due to gram-negative UTI (HCC) 11/04/2022   Thrombocytopenia (HCC) 08/05/2021   Aortic atherosclerosis (HCC) 08/05/2021   Pressure injury of right buttock, stage 2 (HCC) 08/01/2021   Protein-calorie malnutrition, severe 08/01/2021   Distended abdomen 08/01/2021   Uremia 08/01/2021   Acute renal failure (ARF) (HCC) 07/31/2021   DNR (do not resuscitate)/DNI(Do Not Intubate) 07/31/2021   Depression    Advanced dementia (HCC) 10/20/2020   GERD (gastroesophageal reflux disease) 10/20/2020   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   Iron deficiency anemia, unspecified 04/13/2014   Neuropathy 04/13/2014   Osteopenia 04/13/2014   Other specified disorders of bone density and structure, unspecified site 04/13/2014   Polyneuropathy 04/13/2014   Sleep apnea 04/13/2014   Vitamin D deficiency 04/13/2014   Essential (primary) hypertension 03/04/2014   History of colon cancer 03/04/2014   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/04/2014   Venous stasis dermatitis of both lower extremities 03/04/2014    Orientation RESPIRATION BLADDER Height & Weight     Self, Time, Place  Normal Indwelling catheter Weight: 67.1 kg Height:  5\' 4"  (162.6 cm)  BEHAVIORAL SYMPTOMS/MOOD NEUROLOGICAL BOWEL NUTRITION STATUS      Incontinent Diet (Carb modified)  AMBULATORY STATUS COMMUNICATION OF NEEDS Skin   Total Care Verbally PU Stage and Appropriate Care                       Personal Care Assistance Level of Assistance              Functional Limitations Info             SPECIAL CARE FACTORS FREQUENCY                       Contractures Contractures Info: Not present    Additional Factors Info  Code Status, Allergies Code Status Info: DNR Allergies Info: NKDA           Current Medications (11/08/2022):  This is the current hospital active medication list Current Facility-Administered Medications  Medication Dose Route Frequency Provider Last Rate Last Admin   acetaminophen (TYLENOL) 500 MG tablet            acetaminophen (TYLENOL) tablet 1,000 mg  1,000 mg Oral Q6H PRN Manuela Schwartz, NP   1,000 mg at 11/08/22 4782   Or   acetaminophen (TYLENOL) suppository 650 mg  650 mg Rectal Q6H PRN Manuela Schwartz, NP  cefTRIAXone (ROCEPHIN) 2 g in sodium chloride 0.9 % 100 mL IVPB  2 g Intravenous Q24H Merwyn Katos, MD 200 mL/hr at 11/07/22 1656 2 g at 11/07/22 1656   chlorhexidine (PERIDEX) 0.12 % solution 15 mL  15 mL Mouth/Throat Daily Mansy, Jan A, MD   15 mL at 11/08/22 0911   Chlorhexidine Gluconate Cloth 2 % PADS 6 each  6 each Topical Daily Sunnie Nielsen, DO   6 each at 11/07/22 1425   escitalopram (LEXAPRO) tablet 5 mg  5 mg Oral Daily Mansy, Jan A, MD   5 mg at 11/08/22 0911   feeding supplement (ENSURE ENLIVE / ENSURE PLUS) liquid 237 mL  237 mL Oral BID BM Mansy, Jan A, MD   237 mL at 11/07/22 1425   Gerhardt's butt cream   Topical BID  Sunnie Nielsen, DO   Given at 11/07/22 2159   heparin injection 5,000 Units  5,000 Units Subcutaneous Q8H Madueme, Elvira C, RPH   5,000 Units at 11/08/22 0615   insulin aspart (novoLOG) injection 0-5 Units  0-5 Units Subcutaneous QHS Mansy, Jan A, MD       insulin aspart (novoLOG) injection 0-9 Units  0-9 Units Subcutaneous TID WC Mansy, Vernetta Honey, MD   2 Units at 11/05/22 1722   lactated ringers infusion   Intravenous Continuous Manuela Schwartz, NP 100 mL/hr at 11/08/22 0648 New Bag at 11/08/22 0648   magnesium hydroxide (MILK OF MAGNESIA) suspension 30 mL  30 mL Oral Daily PRN Mansy, Jan A, MD       metoCLOPramide (REGLAN) injection 5 mg  5 mg Intravenous Q6H Mansy, Jan A, MD   5 mg at 11/08/22 1610   mirtazapine (REMERON) tablet 7.5 mg  7.5 mg Oral QHS Mansy, Jan A, MD   7.5 mg at 11/07/22 2157   ondansetron (ZOFRAN) tablet 4 mg  4 mg Oral Q6H PRN Mansy, Jan A, MD       Or   ondansetron [facility]) injection 4 mg  4 mg Intravenous Q6H PRN Mansy, Jan A, MD       pantoprazole (PROTONIX) EC tablet 40 mg  40 mg Oral BID Jaynie Bream, RPH   40 mg at 11/08/22 9604   prenatal multivitamin tablet 1 tablet  1 tablet Oral Daily Mansy, Jan A, MD   1 tablet at 11/08/22 5409   senna-docusate (Senokot-S) tablet 1 tablet  1 tablet Oral QHS PRN Mansy, Jan A, MD       traZODone (DESYREL) tablet 25 mg  25 mg Oral QHS PRN Mansy, Vernetta Honey, MD         Discharge Medications: Please see discharge summary for a list of discharge medications.  Relevant Imaging Results:  Relevant Lab Results:   Additional Information SS# [id number]  Chapman Fitch, RN

## 2022-11-08 NOTE — Progress Notes (Addendum)
       CROSS COVER NOTE  NAME: Kaitlin Wagner MRN: [id number] DOB : 1946/02/06    HPI/Events of Note   Report:Patient admit for sepsis due to UTI and AKI on CKD. This am HR is 148, all other vitals are normal, patient has no complaints, not in distress. This looks like the first time her HR got this high, could we do an EKG and some labetalol to bring down the heart rate.   On review of chart: Presented to hospital with intractable nausea vomiting, diarrhea  [facility]. Admitted with sepsis, UTI, AKI on CKD IV   Assessment and  Interventions   Assessment: Sodium this am 149 EKG wide complex tach  Plan: Change LR to NS at 100 Metoprolol 5 mg x1       Donnie Mesa NP [facility] Cross Cover 7pm-7am - check amion for availability Pager [phone]

## 2022-11-09 LAB — BASIC METABOLIC PANEL
Anion gap: 10 (ref 5–15)
BUN: 75 mg/dL — ABNORMAL HIGH (ref 8–23)
CO2: 21 mmol/L — ABNORMAL LOW (ref 22–32)
Calcium: 8.7 mg/dL — ABNORMAL LOW (ref 8.9–10.3)
Chloride: 116 mmol/L — ABNORMAL HIGH (ref 98–111)
Creatinine, Ser: 4.16 mg/dL — ABNORMAL HIGH (ref 0.44–1.00)
GFR, Estimated: 10 mL/min — ABNORMAL LOW (ref >60)
Glucose, Bld: 75 mg/dL (ref 70–99)
Potassium: 4.7 mmol/L (ref 3.5–5.1)
Sodium: 147 mmol/L — ABNORMAL HIGH (ref 135–145)

## 2022-11-09 LAB — GLUCOSE, CAPILLARY
Glucose-Capillary: 73 mg/dL (ref 70–99)
Glucose-Capillary: 80 mg/dL (ref 70–99)
Glucose-Capillary: 146 mg/dL — ABNORMAL HIGH (ref 70–99)
Glucose-Capillary: 87 mg/dL (ref 70–99)
Glucose-Capillary: 143 mg/dL — ABNORMAL HIGH (ref 70–99)

## 2022-11-09 LAB — CBC WITH DIFFERENTIAL/PLATELET
Abs Immature Granulocytes: 0.08 10*3/uL — ABNORMAL HIGH (ref 0.00–0.07)
Basophils Absolute: 0 10*3/uL (ref 0.0–0.1)
Basophils Relative: 0 %
Eosinophils Absolute: 0.4 10*3/uL (ref 0.0–0.5)
Eosinophils Relative: 3 %
HCT: 30.9 % — ABNORMAL LOW (ref 36.0–46.0)
Hemoglobin: 10.1 g/dL — ABNORMAL LOW (ref 12.0–15.0)
Immature Granulocytes: 1 %
Lymphocytes Relative: 12 %
Lymphs Abs: 1.6 10*3/uL (ref 0.7–4.0)
MCH: 30.2 pg (ref 26.0–34.0)
MCHC: 32.7 g/dL (ref 30.0–36.0)
MCV: 92.5 fL (ref 80.0–100.0)
Monocytes Absolute: 1.2 10*3/uL — ABNORMAL HIGH (ref 0.1–1.0)
Monocytes Relative: 9 %
Neutro Abs: 10.4 10*3/uL — ABNORMAL HIGH (ref 1.7–7.7)
Neutrophils Relative %: 75 %
Platelets: 190 10*3/uL (ref 150–400)
RBC: 3.34 MIL/uL — ABNORMAL LOW (ref 3.87–5.11)
RDW: 14.5 % (ref 11.5–15.5)
WBC: 13.7 10*3/uL — ABNORMAL HIGH (ref 4.0–10.5)
nRBC: 0 % (ref 0.0–0.2)

## 2022-11-09 LAB — CULTURE, BLOOD (ROUTINE X 2): Culture: NO GROWTH

## 2022-11-09 NOTE — Progress Notes (Signed)
[facility]  PROGRESS NOTE   Subjective:   Feels much better today.  Family at bedside.  Objective:  Vital signs: Blood pressure (!) 152/81, pulse 74, temperature 98.4 F (36.9 C), temperature source Oral, resp. rate 18, height 5\' 4"  (1.626 m), weight 67.1 kg, SpO2 96 %.  Intake/Output Summary (Last 24 hours) at 11/09/2022 1415 Last data filed at 11/09/2022 0341 Gross per 24 hour  Intake 1898.33 ml  Output 1350 ml  Net 548.33 ml   Filed Weights   11/04/22 1625  Weight: 67.1 kg     Physical Exam: General:  No acute distress  Head:  Normocephalic, atraumatic. Moist oral mucosal membranes  Eyes:  Anicteric  Neck:  Supple  Lungs:   Clear to auscultation, normal effort  Heart:  S1S2 no rubs  Abdomen:   Soft, nontender, bowel sounds present  Extremities:  peripheral edema.  Neurologic:  Awake, alert, following commands  Skin:  No lesions  Access:     Basic Metabolic Panel: Recent Labs  Lab 11/05/22 1240 11/06/22 0530 11/07/22 0723 11/08/22 0421 11/09/22 0456  NA 140 143 144 149* 147*  K 3.2* 2.7* 2.8* 3.9 4.7  CL 108 105 105 114* 116*  CO2 16* 25 26 25  21*  GLUCOSE 167* 128* 108* 100* 75  BUN 124* 128* 112* 93* 75*  CREATININE 9.63* 8.62* 6.53* 5.31* 4.16*  CALCIUM 7.9* 7.7* 7.9* 8.4* 8.7*  MG  --  1.9  --   --   --    GFR: Estimated Creatinine Clearance: 10.5 mL/min (A) (by C-G formula based on SCr of 4.16 mg/dL (H)).  Liver Function Tests: Recent Labs  Lab 11/04/22 1623  AST 22  ALT 22  ALKPHOS 171*  BILITOT 0.7  PROT 8.6*  ALBUMIN 3.9   No results for input(s): "LIPASE", "AMYLASE" in the last 168 hours. No results for input(s): "AMMONIA" in the last 168 hours.  CBC: Recent Labs  Lab 11/04/22 1623 11/05/22 0611 11/06/22 0530 11/07/22 0723 11/08/22 0421 11/09/22 0456  WBC 15.8* 13.5* 8.9 10.3 11.6* 13.7*  NEUTROABS 13.1*  --   --  7.1 8.1* 10.4*  HGB 13.9 12.7 10.5* 10.8* 10.4* 10.1*  HCT 43.9 39.2 30.6* 32.6* 32.4* 30.9*   MCV 92.4 90.3 86.2 88.8 92.3 92.5  PLT 268 239 207 202 185 190     HbA1C: Hgb A1c MFr Bld  Date/Time Value Ref Range Status  11/04/2022 04:23 PM 5.4 4.8 - 5.6 % Final    Comment:    (NOTE) Pre diabetes:          5.7%-6.4%  Diabetes:              >6.4%  Glycemic control for   <7.0% adults with diabetes     Urinalysis: No results for input(s): "COLORURINE", "LABSPEC", "PHURINE", "GLUCOSEU", "HGBUR", "BILIRUBINUR", "KETONESUR", "PROTEINUR", "UROBILINOGEN", "NITRITE", "LEUKOCYTESUR" in the last 72 hours.  Invalid input(s): "APPERANCEUR"    Imaging: No results found.   Medications:    cefTRIAXone (ROCEPHIN)  IV 2 g (11/08/22 1547)   lactated ringers 100 mL/hr at 11/09/22 1201    chlorhexidine  15 mL Mouth/Throat Daily   Chlorhexidine Gluconate Cloth  6 each Topical Daily   escitalopram  5 mg Oral Daily   feeding supplement  237 mL Oral BID BM   Gerhardt's butt cream   Topical BID   heparin injection (subcutaneous)  5,000 Units Subcutaneous Q8H   insulin aspart  0-5 Units Subcutaneous QHS   insulin aspart  0-9  Units Subcutaneous TID WC   metoCLOPramide (REGLAN) injection  5 mg Intravenous Q6H   mirtazapine  7.5 mg Oral QHS   pantoprazole  40 mg Oral BID   prenatal multivitamin  1 tablet Oral Daily    Assessment/ Plan:     77 y.o. female with a past medical history of GERD, hypertension, diabetes, dementia and hyperlipidemia Patient presents to the ED with abnormal labs. She has been admitted for AKI (acute kidney injury) (HCC) [N17.9] Intractable diarrhea [R19.7] Urinary tract infection with hematuria, site unspecified [N39.0, R31.9] Acute renal failure, unspecified acute renal failure type (HCC) [N17.9] Acute kidney injury superimposed on chronic kidney disease (HCC) [N17.9, N18.9]  #1: Acute kidney injury on chronic kidney disease: Renal indices are now improving slowly but steadily towards baseline.  Will continue the IV fluids with Ringer's lactate.  #2:  Sepsis: Continue the Rocephin as ordered.  #3: Diabetes: Continue the insulin as ordered.  #4: Anemia: Anemia most likely secondary to chronic kidney disease.  Will follow closely.  #5: Metabolic acidosis: Possibly secondary to chronic kidney disease will continue to monitor closely.    LOS: 5 Lorain Childes, MD [facility] 5/11/20242:15 PM

## 2022-11-09 NOTE — Progress Notes (Signed)
Progress Note   Patient: Kaitlin Wagner ZOX:[id number] DOB: 06-05-1946 DOA: 11/04/2022     5 DOS: the patient was seen and examined on 11/09/2022    Subjective: Patient seen and examined at bedside this morning  she denied nausea vomiting chest pain or cough Renal function improving She denies any acute overnight events Nephrology team on board and case discussed today   Brief hospital course   From HPI: "[name] is a 77 y.o. female with medical history significant for type diabetes mellitus, dementia, GERD, hypertension and dyslipidemia, followed by [facility] hospice, who presented to the emergency room with acute onset of intractable nausea, vomiting and diarrhea for the last 3 days.  She has been having deteriorating renal functions and associated UTI.  [facility] her creatinine was 7 UA showed nitrites and bacteria.  Recommendation was for presentation to the ER given previous history of dehydration and severe AKI.  She denies any bilious vomitus or hematemesis.  No melena or bright red bleeding per rectum or abdominal pain.  No chest pain or palpitations.  No cough or wheezing or dyspnea.   ED Course: When she came to the ER, BP was 147/37 with otherwise normal vital signs.  Creatinine was 10.75 and a BUN of 140 with a CO2 of a glucose of 120 and 2 protein of 8.6 with alk phos of 171.  CBC showed leukocytosis of 15.8 with neutrophilia.  PT was 16.6 and INR 1.4.  Lactic acid was 0.9 and later 1.9. EKG as reviewed by me : EKG showed sinus rhythm with a rate of 83 with Q waves inferiorly anteroseptally. 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 Arta Silence on 11/06/2022 and patient's creatinine was 1.8 on 10/30/2022 -Continue telemetry monitoring - We will continue hydration with IVf   - We will continue monitoring her BUN and creatinine. - Nephrology on board we appreciate input According to nephrologist they would like improvement in creatinine close to baseline before discharge     Acute gastroenteritis - We will continue hydration with IVF - She will be placed on as needed antiemetics and scheduled Reglan. - Will advance her diet as tolerated. - We will hold off her aspirin. - Continue PPI therapy with Protonix.   Large ventral hernia Outpatient follow-up No indication for emergent surgery at this time   Hypokalemia-continue potassium repletion and monitoring   Sepsis due to gram-negative UTI (HCC) - Sepsis manifested by tachypnea, tachycardia and leukocytosis. - We will continue IV antibiotic therapy with Rocephin. - We will follow blood and urine cultures.   Metabolic acidosis - Continue IV bicarbonate drip. - Will follow her CO2.   Type 2 diabetes mellitus with chronic kidney disease (HCC) - Continue supplement coverage with NovoLog.   Depression - We will continue Lexapro and Remeron.     DVT prophylaxis: Subcutaneous heparin.   Advanced Care Planning:  Code Status: The patient is DNR and has an out of facility DNR form but wanted to be intubated if she has respiratory failure/arrest without cardiac arrest.     Disposition Plan: Back to previous home environment   Consults called: Nephrology.     Physical Exam: GENERAL:  77 y.o.-year-old female patient lying in the bed with no acute distress.  EYES: Pupils equal, round, reactive to light  and accommodation. No scleral icterus. Extraocular muscles intact.  HEENT: Head atraumatic, normocephalic. Oropharynx and nasopharynx clear.  NECK:  Supple, no jugular venous distention. No thyroid enlargement, no tenderness.  LUNGS: Normal breath sounds bilaterally, no wheezing, rales,rhonchi or crepitation. No use of accessory muscles of respiration.  CARDIOVASCULAR: Regular rate and rhythm, S1, S2  normal. No murmurs, rubs, or gallops.  ABDOMEN: Soft, nondistended, nontender. Bowel sounds present. No organomegaly.  She has a large ventral nontender hernia. EXTREMITIES: No pedal edema, cyanosis, or clubbing.  NEUROLOGIC: Cranial nerves II through XII are intact. Muscle strength 5/5 in all extremities. Sensation intact. Gait not checked.  PSYCHIATRIC: The patient is alert and oriented x 3.  Normal affect and good eye contact. SKIN: No obvious rash, lesion, or ulcer.        Data Reviewed: Personally reviewed the patient's laboratory results today Showing sodium 147 potassium 4.7 chloride 116 creatinine 4.16 which is improved from 5 yesterday   Family Communication: None present at bedside   Disposition: Status is: Inpatient Still needing nephrology consultation in the setting of acute renal failure       Time spent: 43 minutes          Vitals:   11/08/22 2045 11/09/22 0106 11/09/22 0318 11/09/22 0746  BP: (!) 155/65 (!) 123/52 139/65 (!) 152/81  Pulse: 75 72 70 74  Resp: 19 18 20 18   Temp: 99.4 F (37.4 C) 98.4 F (36.9 C) 97.6 F (36.4 C) 98.4 F (36.9 C)  TempSrc: Oral Oral Oral Oral  SpO2: 96% 97% 98% 96%  Weight:      Height:        Author: Loyce Dys, MD 11/09/2022 1:58 PM  For on call review [email or web address].

## 2022-11-09 NOTE — Plan of Care (Signed)

## 2022-11-10 LAB — BASIC METABOLIC PANEL
Anion gap: 7 (ref 5–15)
BUN: 63 mg/dL — ABNORMAL HIGH (ref 8–23)
CO2: 24 mmol/L (ref 22–32)
Calcium: 8.6 mg/dL — ABNORMAL LOW (ref 8.9–10.3)
Chloride: 113 mmol/L — ABNORMAL HIGH (ref 98–111)
Creatinine, Ser: 3.28 mg/dL — ABNORMAL HIGH (ref 0.44–1.00)
GFR, Estimated: 14 mL/min — ABNORMAL LOW (ref >60)
Glucose, Bld: 111 mg/dL — ABNORMAL HIGH (ref 70–99)
Potassium: 3.5 mmol/L (ref 3.5–5.1)
Sodium: 144 mmol/L (ref 135–145)

## 2022-11-10 LAB — GLUCOSE, CAPILLARY
Glucose-Capillary: 83 mg/dL (ref 70–99)
Glucose-Capillary: 139 mg/dL — ABNORMAL HIGH (ref 70–99)
Glucose-Capillary: 127 mg/dL — ABNORMAL HIGH (ref 70–99)
Glucose-Capillary: 143 mg/dL — ABNORMAL HIGH (ref 70–99)

## 2022-11-10 NOTE — Progress Notes (Signed)
[facility]  PROGRESS NOTE   Subjective:   Patient seen at bedside.  Feels much better this morning. Denies any nausea vomiting diarrhea.  Objective:  Vital signs: Blood pressure (!) 155/73, pulse 77, temperature 98.1 F (36.7 C), temperature source Oral, resp. rate 16, height 5\' 4"  (1.626 m), weight 67.1 kg, SpO2 95 %.  Intake/Output Summary (Last 24 hours) at 11/10/2022 1143 Last data filed at 11/10/2022 0438 Gross per 24 hour  Intake 0 ml  Output 1100 ml  Net -1100 ml   Filed Weights   11/04/22 1625  Weight: 67.1 kg     Physical Exam: General:  No acute distress  Head:  Normocephalic, atraumatic. Moist oral mucosal membranes  Eyes:  Anicteric  Neck:  Supple  Lungs:   Clear to auscultation, normal effort  Heart:  S1S2 no rubs  Abdomen:   Soft, nontender, bowel sounds present  Extremities:  peripheral edema.  Neurologic:  Awake, alert, following commands  Skin:  No lesions  Access:     Basic Metabolic Panel: Recent Labs  Lab 11/06/22 0530 11/07/22 0723 11/08/22 0421 11/09/22 0456 11/10/22 0459  NA 143 144 149* 147* 144  K 2.7* 2.8* 3.9 4.7 3.5  CL 105 105 114* 116* 113*  CO2 25 26 25  21* 24  GLUCOSE 128* 108* 100* 75 111*  BUN 128* 112* 93* 75* 63*  CREATININE 8.62* 6.53* 5.31* 4.16* 3.28*  CALCIUM 7.7* 7.9* 8.4* 8.7* 8.6*  MG 1.9  --   --   --   --    GFR: Estimated Creatinine Clearance: 13.3 mL/min (A) (by C-G formula based on SCr of 3.28 mg/dL (H)).  Liver Function Tests: Recent Labs  Lab 11/04/22 1623  AST 22  ALT 22  ALKPHOS 171*  BILITOT 0.7  PROT 8.6*  ALBUMIN 3.9   No results for input(s): "LIPASE", "AMYLASE" in the last 168 hours. No results for input(s): "AMMONIA" in the last 168 hours.  CBC: Recent Labs  Lab 11/04/22 1623 11/05/22 0611 11/06/22 0530 11/07/22 0723 11/08/22 0421 11/09/22 0456  WBC 15.8* 13.5* 8.9 10.3 11.6* 13.7*  NEUTROABS 13.1*  --   --  7.1 8.1* 10.4*  HGB 13.9 12.7 10.5* 10.8* 10.4* 10.1*   HCT 43.9 39.2 30.6* 32.6* 32.4* 30.9*  MCV 92.4 90.3 86.2 88.8 92.3 92.5  PLT 268 239 207 202 185 190     HbA1C: Hgb A1c MFr Bld  Date/Time Value Ref Range Status  11/04/2022 04:23 PM 5.4 4.8 - 5.6 % Final    Comment:    (NOTE) Pre diabetes:          5.7%-6.4%  Diabetes:              >6.4%  Glycemic control for   <7.0% adults with diabetes     Urinalysis: No results for input(s): "COLORURINE", "LABSPEC", "PHURINE", "GLUCOSEU", "HGBUR", "BILIRUBINUR", "KETONESUR", "PROTEINUR", "UROBILINOGEN", "NITRITE", "LEUKOCYTESUR" in the last 72 hours.  Invalid input(s): "APPERANCEUR"    Imaging: No results found.   Medications:    cefTRIAXone (ROCEPHIN)  IV 2 g (11/09/22 1656)   lactated ringers 100 mL/hr at 11/09/22 1201    chlorhexidine  15 mL Mouth/Throat Daily   Chlorhexidine Gluconate Cloth  6 each Topical Daily   escitalopram  5 mg Oral Daily   feeding supplement  237 mL Oral BID BM   Gerhardt's butt cream   Topical BID   heparin injection (subcutaneous)  5,000 Units Subcutaneous Q8H   insulin aspart  0-5 Units Subcutaneous  QHS   insulin aspart  0-9 Units Subcutaneous TID WC   metoCLOPramide (REGLAN) injection  5 mg Intravenous Q6H   mirtazapine  7.5 mg Oral QHS   pantoprazole  40 mg Oral BID   prenatal multivitamin  1 tablet Oral Daily    Assessment/ Plan:     77 y.o. female with a past medical history of GERD, hypertension, diabetes, dementia and hyperlipidemia Patient presents to the ED with abnormal labs. She has been admitted for AKI (acute kidney injury) (HCC) [N17.9] Intractable diarrhea [R19.7] Urinary tract infection with hematuria, site unspecified [N39.0, R31.9] Acute renal failure, unspecified acute renal failure type (HCC) [N17.9] Acute kidney injury superimposed on chronic kidney disease (HCC) [N17.9, N18.9]   #1: Acute kidney injury on chronic kidney disease: Renal indices are now improving slowly but steadily towards baseline.  Will continue the IV  fluids with Ringer's lactate at 100 cc an hour.Marland Kitchen   #2:  Gram-negative sepsis: Continue the Rocephin 2 g daily.     #3: Diabetes: Continue the insulin as ordered.   #4: Anemia: Anemia most likely secondary to chronic kidney disease.  Will follow closely.   #5: Metabolic acidosis: Significantly improved at this time.  Now off of bicarbonate.  #6: Acute gastroenteritis: Improved significantly.  Will continue the IV fluids.  Will follow.   LOS: 6 Lorain Childes, MD [facility] 5/12/202411:43 AM

## 2022-11-10 NOTE — Progress Notes (Signed)
Progress Note   Patient: Kaitlin Wagner WUJ:[id number] DOB: Apr 14, 1946 DOA: 11/04/2022     6 DOS: the patient was seen and examined on 11/10/2022      Subjective: Patient seen and examined at bedside this morning  she denied nausea vomiting chest pain or cough Renal function improving She denies any acute overnight events    Brief hospital course   From HPI: "Kaitlin Wagner is a 77 y.o. female with medical history significant for type diabetes mellitus, dementia, GERD, hypertension and dyslipidemia, followed by [facility] hospice, who presented to the emergency room with acute onset of intractable nausea, vomiting and diarrhea for the last 3 days.  She has been having deteriorating renal functions and associated UTI.  [facility] her creatinine was 7 UA showed nitrites and bacteria.  Recommendation was for presentation to the ER given previous history of dehydration and severe AKI.  She denies any bilious vomitus or hematemesis.  No melena or bright red bleeding per rectum or abdominal pain.  No chest pain or palpitations.  No cough or wheezing or dyspnea.   ED Course: When she came to the ER, BP was 147/37 with otherwise normal vital signs.  Creatinine was 10.75 and a BUN of 140 with a CO2 of a glucose of 120 and 2 protein of 8.6 with alk phos of 171.  CBC showed leukocytosis of 15.8 with neutrophilia.  PT was 16.6 and INR 1.4.  Lactic acid was 0.9 and later 1.9. EKG as reviewed by me : EKG showed sinus rhythm with a rate of 83 with Q waves inferiorly anteroseptally. 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 Arta Silence on 11/06/2022 and patient's creatinine was 1.8 on 10/30/2022 -Continue telemetry monitoring - We will continue hydration with IVf  - We will continue monitoring her BUN and  creatinine. - Nephrology on board we appreciate input According to nephrologist they would like improvement in creatinine close to baseline before discharge     Acute gastroenteritis - We will continue hydration with IVF - Continue on as needed antiemetics and scheduled Reglan. - Will advance her diet as tolerated. - We will hold off her aspirin. - Continue PPI therapy with Protonix.   Large ventral hernia Outpatient follow-up No indication for emergent surgery at this time   Hypokalemia-continue potassium repletion and monitoring   Sepsis due to gram-negative UTI (HCC) - Sepsis manifested by tachypnea, tachycardia and leukocytosis. - We will continue IV antibiotic therapy with Rocephin. - We will follow blood and urine cultures.   Metabolic acidosis - Fluid off bicarb drip - Will follow her CO2.   Type 2 diabetes mellitus with chronic kidney disease (HCC) - Continue supplement coverage with NovoLog.   Depression - We will continue Lexapro and Remeron.     DVT prophylaxis: Subcutaneous heparin.   Advanced Care Planning:  Code Status: The patient is DNR and has an out of facility DNR form but wanted to be intubated if she has respiratory failure/arrest without cardiac arrest.     Disposition Plan: Back to previous home environment   Consults called: Nephrology.     Physical Exam: GENERAL:  77 y.o.-year-old female patient lying in the bed with no acute distress.  EYES: Pupils equal, round, reactive to light and accommodation. No scleral icterus. Extraocular muscles intact.  HEENT: Head atraumatic, normocephalic. Oropharynx and nasopharynx clear.  NECK:  Supple, no jugular venous distention. No thyroid enlargement, no tenderness.  LUNGS: Normal breath sounds bilaterally, no wheezing, rales,rhonchi or crepitation. No use of accessory muscles of respiration.  CARDIOVASCULAR: Regular rate and rhythm, S1, S2 normal. No murmurs, rubs, or gallops.  ABDOMEN: Soft,  nondistended, nontender. Bowel sounds present. No organomegaly.  She has a large ventral nontender hernia. EXTREMITIES: No pedal edema, cyanosis, or clubbing.  NEUROLOGIC: Cranial nerves II through XII are intact. Muscle strength 5/5 in all extremities. Sensation intact. Gait not checked.  PSYCHIATRIC: The patient is alert and oriented x 3.  Normal affect and good eye contact. SKIN: No obvious rash, lesion, or ulcer.        Data Reviewed: Personally reviewed the patient's laboratory results today Showing sodium 144 potassium 3.5 creatinine 3.2   Family Communication: None present at bedside   Disposition: Status is: Inpatient Still needing nephrology consultation in the setting of acute renal failure       Time spent: 40 minutes       Vitals:   11/09/22 1515 11/09/22 2009 11/10/22 0500 11/10/22 0813  BP: (!) 140/68 (!) 136/98 134/82 (!) 155/73  Pulse: 71 77 74 77  Resp: 16 16 16 16   Temp: 98.4 F (36.9 C) 98.5 F (36.9 C) 98.3 F (36.8 C) 98.1 F (36.7 C)  TempSrc: Oral Oral Oral Oral  SpO2: 100% 98% 100% 95%  Weight:      Height:        Author: Loyce Dys, MD 11/10/2022 4:36 PM  For on call review [email or web address].

## 2022-11-11 LAB — GLUCOSE, CAPILLARY
Glucose-Capillary: 70 mg/dL (ref 70–99)
Glucose-Capillary: 105 mg/dL — ABNORMAL HIGH (ref 70–99)
Glucose-Capillary: 109 mg/dL — ABNORMAL HIGH (ref 70–99)
Glucose-Capillary: 89 mg/dL (ref 70–99)

## 2022-11-11 LAB — CBC WITH DIFFERENTIAL/PLATELET
Abs Immature Granulocytes: 0.05 10*3/uL (ref 0.00–0.07)
Basophils Absolute: 0 10*3/uL (ref 0.0–0.1)
Basophils Relative: 0 %
Eosinophils Absolute: 0.4 10*3/uL (ref 0.0–0.5)
Eosinophils Relative: 4 %
HCT: 31.3 % — ABNORMAL LOW (ref 36.0–46.0)
Hemoglobin: 9.6 g/dL — ABNORMAL LOW (ref 12.0–15.0)
Immature Granulocytes: 1 %
Lymphocytes Relative: 13 %
Lymphs Abs: 1.3 10*3/uL (ref 0.7–4.0)
MCH: 29.4 pg (ref 26.0–34.0)
MCHC: 30.7 g/dL (ref 30.0–36.0)
MCV: 95.7 fL (ref 80.0–100.0)
Monocytes Absolute: 0.9 10*3/uL (ref 0.1–1.0)
Monocytes Relative: 8 %
Neutro Abs: 7.9 10*3/uL — ABNORMAL HIGH (ref 1.7–7.7)
Neutrophils Relative %: 74 %
Platelets: 175 10*3/uL (ref 150–400)
RBC: 3.27 MIL/uL — ABNORMAL LOW (ref 3.87–5.11)
RDW: 14.3 % (ref 11.5–15.5)
WBC: 10.5 10*3/uL (ref 4.0–10.5)
nRBC: 0 % (ref 0.0–0.2)

## 2022-11-11 LAB — BASIC METABOLIC PANEL
Anion gap: 8 (ref 5–15)
BUN: 61 mg/dL — ABNORMAL HIGH (ref 8–23)
CO2: 24 mmol/L (ref 22–32)
Calcium: 8.3 mg/dL — ABNORMAL LOW (ref 8.9–10.3)
Chloride: 112 mmol/L — ABNORMAL HIGH (ref 98–111)
Creatinine, Ser: 2.92 mg/dL — ABNORMAL HIGH (ref 0.44–1.00)
GFR, Estimated: 16 mL/min — ABNORMAL LOW (ref >60)
Glucose, Bld: 108 mg/dL — ABNORMAL HIGH (ref 70–99)
Potassium: 3.9 mmol/L (ref 3.5–5.1)
Sodium: 144 mmol/L (ref 135–145)

## 2022-11-11 NOTE — Progress Notes (Signed)
[facility]  PROGRESS NOTE   Subjective:   Denies any GI symptoms.   Creatinine 2.92 (3.28) UOP - recorded  Objective:  Vital signs: Blood pressure 129/72, pulse 81, temperature 98.2 F (36.8 C), temperature source Oral, resp. rate 18, height 5\' 4"  (1.626 m), weight 67.1 kg, SpO2 95 %.  Intake/Output Summary (Last 24 hours) at 11/11/2022 1546 Last data filed at 11/11/2022 0549 Gross per 24 hour  Intake 1100 ml  Output --  Net 1100 ml    Filed Weights   11/04/22 1625  Weight: 67.1 kg     Physical Exam: General:  No acute distress  Head:  Normocephalic, atraumatic. Moist oral mucosal membranes  Eyes:  Anicteric  Neck:  Supple  Lungs:   Clear to auscultation, normal effort  Heart:  regular  Abdomen:   Soft, nontender, bowel sounds present  Extremities:  peripheral edema.  Neurologic:  Awake, alert, following commands  Skin:  No lesions        Basic Metabolic Panel: Recent Labs  Lab 11/06/22 0530 11/07/22 0723 11/08/22 0421 11/09/22 0456 11/10/22 0459 11/11/22 0325  NA 143 144 149* 147* 144 144  K 2.7* 2.8* 3.9 4.7 3.5 3.9  CL 105 105 114* 116* 113* 112*  CO2 25 26 25  21* 24 24  GLUCOSE 128* 108* 100* 75 111* 108*  BUN 128* 112* 93* 75* 63* 61*  CREATININE 8.62* 6.53* 5.31* 4.16* 3.28* 2.92*  CALCIUM 7.7* 7.9* 8.4* 8.7* 8.6* 8.3*  MG 1.9  --   --   --   --   --     GFR: Estimated Creatinine Clearance: 15 mL/min (A) (by C-G formula based on SCr of 2.92 mg/dL (H)).  Liver Function Tests: Recent Labs  Lab 11/04/22 1623  AST 22  ALT 22  ALKPHOS 171*  BILITOT 0.7  PROT 8.6*  ALBUMIN 3.9    No results for input(s): "LIPASE", "AMYLASE" in the last 168 hours. No results for input(s): "AMMONIA" in the last 168 hours.  CBC: Recent Labs  Lab 11/04/22 1623 11/05/22 1610 11/06/22 0530 11/07/22 0723 11/08/22 0421 11/09/22 0456 11/11/22 0325  WBC 15.8*   < > 8.9 10.3 11.6* 13.7* 10.5  NEUTROABS 13.1*  --   --  7.1 8.1* 10.4*  7.9*  HGB 13.9   < > 10.5* 10.8* 10.4* 10.1* 9.6*  HCT 43.9   < > 30.6* 32.6* 32.4* 30.9* 31.3*  MCV 92.4   < > 86.2 88.8 92.3 92.5 95.7  PLT 268   < > 207 202 185 190 175   < > = values in this interval not displayed.      HbA1C: Hgb A1c MFr Bld  Date/Time Value Ref Range Status  11/04/2022 04:23 PM 5.4 4.8 - 5.6 % Final    Comment:    (NOTE) Pre diabetes:          5.7%-6.4%  Diabetes:              >6.4%  Glycemic control for   <7.0% adults with diabetes     Urinalysis: No results for input(s): "COLORURINE", "LABSPEC", "PHURINE", "GLUCOSEU", "HGBUR", "BILIRUBINUR", "KETONESUR", "PROTEINUR", "UROBILINOGEN", "NITRITE", "LEUKOCYTESUR" in the last 72 hours.  Invalid input(s): "APPERANCEUR"    Imaging: No results found.   Medications:    lactated ringers 100 mL/hr at 11/11/22 1126    chlorhexidine  15 mL Mouth/Throat Daily   Chlorhexidine Gluconate Cloth  6 each Topical Daily   escitalopram  5 mg Oral Daily  feeding supplement  237 mL Oral BID BM   Gerhardt's butt cream   Topical BID   heparin injection (subcutaneous)  5,000 Units Subcutaneous Q8H   insulin aspart  0-5 Units Subcutaneous QHS   insulin aspart  0-9 Units Subcutaneous TID WC   metoCLOPramide (REGLAN) injection  5 mg Intravenous Q6H   mirtazapine  7.5 mg Oral QHS   pantoprazole  40 mg Oral BID   prenatal multivitamin  1 tablet Oral Daily    Assessment/ Plan:     77 y.o. female with a past medical history of GERD, hypertension, diabetes, dementia and hyperlipidemia Patient presents to the ED with abnormal labs. She has been admitted for AKI (acute kidney injury) (HCC) [N17.9] Intractable diarrhea [R19.7] Urinary tract infection with hematuria, site unspecified [N39.0, R31.9] Acute renal failure, unspecified acute renal failure type (HCC) [N17.9] Acute kidney injury superimposed on chronic kidney disease (HCC) [N17.9, N18.9]   #1: Acute kidney injury on chronic kidney disease stage IV: Renal  indices are now improving slowly but steadily towards baseline.   - LR at 111mL/hr   #2:  Gram-negative sepsis: Continue the Rocephin 2 g daily.     #3: Diabetes mellitus type II with chronic kidney disease: Continue glucose control   #4: Anemia with chronic kidney disease: hemoglobin 9.6. No indication for epo   #5: Metabolic acidosis:  Now off of bicarbonate.  #6: Acute gastroenteritis: Improved significantly.  Will continue the IV fluids. Symptom treatments    LOS: 6 Lamont Dowdy, MD [facility] 5/13/20243:46 PM

## 2022-11-11 NOTE — Discharge Summary (Signed)
Physician Discharge Summary   Patient: Kaitlin Wagner MRN: [id number] DOB: [birth year]  Admit date:     11/04/2022  Discharge date: 11/11/22  Discharge Physician: Loyce Dys   PCP: Gwendlyn Deutscher, MD    Discharge Diagnoses:  Acute kidney injury superimposed on chronic kidney disease (HCC) stage IV Acute gastroenteritis Large ventral hernia Hypokalemia-improved Sepsis due to gram-negative UTI (HCC) Metabolic acidosis Type 2 diabetes mellitus with chronic kidney disease [facility]) Depression    Hospital Course:  Kaitlin Wagner is a 77 y.o. female with medical history significant for type diabetes mellitus, dementia, GERD, hypertension and dyslipidemia, followed by [facility] hospice, who presented to the emergency room with acute onset of intractable nausea, vomiting and diarrhea for the last 3 days.  She has been having deteriorating renal functions and associated UTI.  [facility] and her creatinine was 7. UA showed nitrites and bacteria.  Recommendation was for presentation to the ER given previous history of dehydration and severe AKI.     ED Course: When she came to the ER, BP was 147/37 with otherwise normal vital signs.  Creatinine was 10.75 and a BUN of 140 with a CO2 of a glucose of 120 and 2 protein of 8.6 with alk phos of 171.  CBC showed leukocytosis of 15.8 with neutrophilia.  PT was 16.6 and INR 1.4.   I discussed the case with patient's geriatrician Dr. Arta Silence on 11/06/2022 and patient's creatinine was 1.8 less than 2 weeks before presentation.  Patient underwent IV fluid therapy, IV bicarb with significant improvement in creatinine.  Also patient completed a course of IV antibiotics with ceftriaxone.  She has been cleared by nephrologist for discharge today and to follow-up as an outpatient.    Consultants: nephrology Procedures performed: None Disposition: Skilled nursing facility Diet recommendation:  Cardiac diet DISCHARGE  MEDICATION: Allergies as of 11/11/2022   No Known Allergies      Medication List     STOP taking these medications    cefTRIAXone 1 g injection Commonly known as: ROCEPHIN   cefUROXime 250 MG tablet Commonly known as: CEFTIN   chlorhexidine 0.12 % solution Commonly known as: PERIDEX   Cholecalciferol 25 MCG (1000 UT) tablet   dexamethasone 4 MG tablet Commonly known as: DECADRON   feeding supplement Liqd   senna-docusate 8.6-50 MG tablet Commonly known as: Senokot-S       TAKE these medications    Acetaminophen Childrens 160 MG/5ML Soln Take 20 mLs by mouth 3 (three) times daily as needed for pain.   Anti-Itch lotion Generic drug: camphor-menthol Apply 1 Application topically as needed.   aspirin EC 81 MG tablet Take 81 mg by mouth daily.   escitalopram 5 MG tablet Commonly known as: LEXAPRO Take 5 mg by mouth daily.   esomeprazole 10 MG packet Commonly known as: NEXIUM Take 10 mg by mouth 2 (two) times daily.   mirtazapine 7.5 MG tablet Commonly known as: REMERON Take 1 tablet (7.5 mg total) by mouth at bedtime.   nystatin powder Commonly known as: MYCOSTATIN/NYSTOP Apply 1 Application topically 3 (three) times daily.   PrePLUS 27-1 MG Tabs Take 1 tablet by mouth daily.        Discharge Exam: Filed Weights   11/04/22 1625  Weight: 67.1 kg   GENERAL:  77 y.o.-year-old female patient lying in the bed with no acute distress.  EYES: Pupils equal, round, reactive to light and accommodation. No  scleral icterus. Extraocular muscles intact.  HEENT: Head atraumatic, normocephalic. Oropharynx and nasopharynx clear.  NECK:  Supple, no jugular venous distention. No thyroid enlargement, no tenderness.  LUNGS: Normal breath sounds bilaterally, no wheezing, rales,rhonchi or crepitation. No use of accessory muscles of respiration.  CARDIOVASCULAR: Regular rate and rhythm, S1, S2 normal. No murmurs, rubs, or gallops.  ABDOMEN: Soft, nondistended,  nontender. Bowel sounds present. No organomegaly.  She has a large ventral nontender hernia. EXTREMITIES: No pedal edema, cyanosis, or clubbing.  NEUROLOGIC: Cranial nerves II through XII are intact. Muscle strength 5/5 in all extremities. Sensation intact. Gait not checked.  PSYCHIATRIC: The patient is alert and oriented x 3.  Normal affect and good eye contact. SKIN: No obvious rash, lesion, or ulcer.   Condition at discharge: good   Discharge time spent: greater than 30 minutes.  Signed: Loyce Dys, MD [facility] 11/11/2022    Addendum:  No acute overnight events, patient's creatinine today 2.3. Not having any active diarrhea at the moment. Case discussed with patient's PCP Dr. Arta Silence today. Patient will need bladder scanning at the facility as discussed with patient's PCP and if she is found to be in retention they plan to place a Foley catheter and PCP plans to make an appointment with urologist if indicated at that time.

## 2022-11-12 LAB — CBC WITH DIFFERENTIAL/PLATELET
Abs Immature Granulocytes: 0.04 10*3/uL (ref 0.00–0.07)
Basophils Absolute: 0 10*3/uL (ref 0.0–0.1)
Basophils Relative: 0 %
Eosinophils Absolute: 0.4 10*3/uL (ref 0.0–0.5)
Eosinophils Relative: 4 %
HCT: 33.7 % — ABNORMAL LOW (ref 36.0–46.0)
Hemoglobin: 10.4 g/dL — ABNORMAL LOW (ref 12.0–15.0)
Immature Granulocytes: 0 %
Lymphocytes Relative: 13 %
Lymphs Abs: 1.3 10*3/uL (ref 0.7–4.0)
MCH: 29.8 pg (ref 26.0–34.0)
MCHC: 30.9 g/dL (ref 30.0–36.0)
MCV: 96.6 fL (ref 80.0–100.0)
Monocytes Absolute: 0.8 10*3/uL (ref 0.1–1.0)
Monocytes Relative: 8 %
Neutro Abs: 7.4 10*3/uL (ref 1.7–7.7)
Neutrophils Relative %: 75 %
Platelets: 182 10*3/uL (ref 150–400)
RBC: 3.49 MIL/uL — ABNORMAL LOW (ref 3.87–5.11)
RDW: 14.2 % (ref 11.5–15.5)
WBC: 10 10*3/uL (ref 4.0–10.5)
nRBC: 0 % (ref 0.0–0.2)

## 2022-11-12 LAB — BASIC METABOLIC PANEL
Anion gap: 6 (ref 5–15)
BUN: 41 mg/dL — ABNORMAL HIGH (ref 8–23)
CO2: 26 mmol/L (ref 22–32)
Calcium: 8.3 mg/dL — ABNORMAL LOW (ref 8.9–10.3)
Chloride: 111 mmol/L (ref 98–111)
Creatinine, Ser: 2.36 mg/dL — ABNORMAL HIGH (ref 0.44–1.00)
GFR, Estimated: 21 mL/min — ABNORMAL LOW (ref >60)
Glucose, Bld: 80 mg/dL (ref 70–99)
Potassium: 3.9 mmol/L (ref 3.5–5.1)
Sodium: 143 mmol/L (ref 135–145)

## 2022-11-12 LAB — GLUCOSE, CAPILLARY
Glucose-Capillary: 80 mg/dL (ref 70–99)
Glucose-Capillary: 184 mg/dL — ABNORMAL HIGH (ref 70–99)

## 2022-11-12 NOTE — Progress Notes (Signed)
[facility]  PROGRESS NOTE   Subjective:   Denies any GI symptoms.   Patient wants to go home.   Objective:  Vital signs: Blood pressure (!) 155/71, pulse 73, temperature 97.9 F (36.6 C), temperature source Oral, resp. rate 15, height 5\' 4"  (1.626 m), weight 67.1 kg, SpO2 93 %.  Intake/Output Summary (Last 24 hours) at 11/12/2022 1956 Last data filed at 11/12/2022 1039 Gross per 24 hour  Intake 3282.99 ml  Output 1500 ml  Net 1782.99 ml    Filed Weights   11/04/22 1625  Weight: 67.1 kg     Physical Exam: General:  No acute distress  Head:  Normocephalic, atraumatic. Moist oral mucosal membranes  Eyes:  Anicteric  Neck:  Supple  Lungs:   Clear to auscultation, normal effort  Heart:  regular  Abdomen:   Soft, nontender, bowel sounds present  Extremities:  peripheral edema.  Neurologic:  Awake, alert, following commands  Skin:  No lesions        Basic Metabolic Panel: Recent Labs  Lab 11/06/22 0530 11/07/22 0723 11/08/22 0421 11/09/22 0456 11/10/22 0459 11/11/22 0325 11/12/22 0521  NA 143   < > 149* 147* 144 144 143  K 2.7*   < > 3.9 4.7 3.5 3.9 3.9  CL 105   < > 114* 116* 113* 112* 111  CO2 25   < > 25 21* 24 24 26   GLUCOSE 128*   < > 100* 75 111* 108* 80  BUN 128*   < > 93* 75* 63* 61* 41*  CREATININE 8.62*   < > 5.31* 4.16* 3.28* 2.92* 2.36*  CALCIUM 7.7*   < > 8.4* 8.7* 8.6* 8.3* 8.3*  MG 1.9  --   --   --   --   --   --    < > = values in this interval not displayed.    GFR: Estimated Creatinine Clearance: 18.5 mL/min (A) (by C-G formula based on SCr of 2.36 mg/dL (H)).  Liver Function Tests: No results for input(s): "AST", "ALT", "ALKPHOS", "BILITOT", "PROT", "ALBUMIN" in the last 168 hours.  No results for input(s): "LIPASE", "AMYLASE" in the last 168 hours. No results for input(s): "AMMONIA" in the last 168 hours.  CBC: Recent Labs  Lab 11/07/22 0723 11/08/22 0421 11/09/22 0456 11/11/22 0325 11/12/22 0521  WBC 10.3  11.6* 13.7* 10.5 10.0  NEUTROABS 7.1 8.1* 10.4* 7.9* 7.4  HGB 10.8* 10.4* 10.1* 9.6* 10.4*  HCT 32.6* 32.4* 30.9* 31.3* 33.7*  MCV 88.8 92.3 92.5 95.7 96.6  PLT 202 185 190 175 182      HbA1C: Hgb A1c MFr Bld  Date/Time Value Ref Range Status  11/04/2022 04:23 PM 5.4 4.8 - 5.6 % Final    Comment:    (NOTE) Pre diabetes:          5.7%-6.4%  Diabetes:              >6.4%  Glycemic control for   <7.0% adults with diabetes     Urinalysis: No results for input(s): "COLORURINE", "LABSPEC", "PHURINE", "GLUCOSEU", "HGBUR", "BILIRUBINUR", "KETONESUR", "PROTEINUR", "UROBILINOGEN", "NITRITE", "LEUKOCYTESUR" in the last 72 hours.  Invalid input(s): "APPERANCEUR"    Imaging: No results found.   Medications:        Assessment/ Plan:     77 y.o. female with a past medical history of GERD, hypertension, diabetes, dementia and hyperlipidemia Patient presents to the ED with abnormal labs. She has been admitted for AKI (acute kidney injury) [facility]) [  N17.9] Intractable diarrhea [R19.7] Urinary tract infection with hematuria, site unspecified [N39.0, R31.9] Acute renal failure, unspecified acute renal failure type (HCC) [N17.9] Acute kidney injury superimposed on chronic kidney disease (HCC) [N17.9, N18.9]   #1: Acute kidney injury on chronic kidney disease stage IV: Renal indices are now improving slowly but steadily towards baseline.   Encourage PO intake.    #2:  Gram-negative sepsis: completed antibiotics.     #3: Diabetes mellitus type II with chronic kidney disease: Continue glucose control   #4: Anemia with chronic kidney disease:   No indication for epo   #5: Metabolic acidosis:  Now off of bicarbonate.  #6: Acute gastroenteritis: Improved significantly.  Will continue the IV fluids. Symptom treatments    LOS: 7 Lamont Dowdy, MD [facility] 5/14/20247:56 PM

## 2022-11-12 NOTE — Progress Notes (Signed)
Leanora Cover to be D/C'd [facility] Manly and [facility] per MD order.  Discussed prescriptions and follow up appointments with the patient. Prescriptions given to patient, medication list explained in detail. Pt verbalized understanding.  Allergies as of 11/12/2022   No Known Allergies      Medication List     STOP taking these medications    cefTRIAXone 1 g injection Commonly known as: ROCEPHIN   cefUROXime 250 MG tablet Commonly known as: CEFTIN   chlorhexidine 0.12 % solution Commonly known as: PERIDEX   Cholecalciferol 25 MCG (1000 UT) tablet   dexamethasone 4 MG tablet Commonly known as: DECADRON   feeding supplement Liqd   senna-docusate 8.6-50 MG tablet Commonly known as: Senokot-S       TAKE these medications    Acetaminophen Childrens 160 MG/5ML Soln Take 20 mLs by mouth 3 (three) times daily as needed for pain.   Anti-Itch lotion Generic drug: camphor-menthol Apply 1 Application topically as needed.   aspirin EC 81 MG tablet Take 81 mg by mouth daily.   escitalopram 5 MG tablet Commonly known as: LEXAPRO Take 5 mg by mouth daily.   esomeprazole 10 MG packet Commonly known as: NEXIUM Take 10 mg by mouth 2 (two) times daily.   mirtazapine 7.5 MG tablet Commonly known as: REMERON Take 1 tablet (7.5 mg total) by mouth at bedtime.   nystatin powder Commonly known as: MYCOSTATIN/NYSTOP Apply 1 Application topically 3 (three) times daily.   PrePLUS 27-1 MG Tabs Take 1 tablet by mouth daily.        Vitals:   11/11/22 1924 11/12/22 0515  BP: 118/61 (!) 155/71  Pulse: 78 73  Resp: 20 15  Temp: 98.5 F (36.9 C) 97.9 F (36.6 C)  SpO2: 97% 93%    Skin clean, dry and intact without evidence of skin break down, no evidence of skin tears noted. IV catheter discontinued intact. Site without signs and symptoms of complications. Dressing and pressure applied. Pt denies pain at this time. No complaints noted.  An After Visit Summary was printed  and given to the patient. Patient escorted via WC, and [facility] provided transport  [place] C. Jilda Roche

## 2022-11-12 NOTE — Plan of Care (Signed)
Patient AOX2, disoriented to time and situation.  All meds given on time as ordered.  Diminished lungs, IS encouraged.  Foley care provided with CHG bath.  POC maintained, will continue to monitor.  Problem: Fluid Volume: Goal: Hemodynamic stability will improve Outcome: Progressing   Problem: Clinical Measurements: Goal: Diagnostic test results will improve Outcome: Progressing Goal: Signs and symptoms of infection will decrease Outcome: Progressing   Problem: Respiratory: Goal: Ability to maintain adequate ventilation will improve Outcome: Progressing   Problem: Education: Goal: Ability to describe self-care measures that may prevent or decrease complications (Diabetes Survival Skills Education) will improve Outcome: Progressing Goal: Individualized Educational Video(s) Outcome: Progressing   Problem: Coping: Goal: Ability to adjust to condition or change in health will improve Outcome: Progressing   Problem: Fluid Volume: Goal: Ability to maintain a balanced intake and output will improve Outcome: Progressing   Problem: Health Behavior/Discharge Planning: Goal: Ability to identify and utilize available resources and services will improve Outcome: Progressing Goal: Ability to manage health-related needs will improve Outcome: Progressing   Problem: Metabolic: Goal: Ability to maintain appropriate glucose levels will improve Outcome: Progressing   Problem: Nutritional: Goal: Maintenance of adequate nutrition will improve Outcome: Progressing Goal: Progress toward achieving an optimal weight will improve Outcome: Progressing   Problem: Skin Integrity: Goal: Risk for impaired skin integrity will decrease Outcome: Progressing   Problem: Tissue Perfusion: Goal: Adequacy of tissue perfusion will improve Outcome: Progressing   Problem: Education: Goal: Knowledge of General Education information will improve Description: Including pain rating scale, medication(s)/side  effects and non-pharmacologic comfort measures Outcome: Progressing   Problem: Health Behavior/Discharge Planning: Goal: Ability to manage health-related needs will improve Outcome: Progressing   Problem: Clinical Measurements: Goal: Ability to maintain clinical measurements within normal limits will improve Outcome: Progressing Goal: Will remain free from infection Outcome: Progressing Goal: Diagnostic test results will improve Outcome: Progressing Goal: Respiratory complications will improve Outcome: Progressing Goal: Cardiovascular complication will be avoided Outcome: Progressing   Problem: Activity: Goal: Risk for activity intolerance will decrease Outcome: Progressing   Problem: Nutrition: Goal: Adequate nutrition will be maintained Outcome: Progressing   Problem: Coping: Goal: Level of anxiety will decrease Outcome: Progressing   Problem: Elimination: Goal: Will not experience complications related to bowel motility Outcome: Progressing Goal: Will not experience complications related to urinary retention Outcome: Progressing   Problem: Pain Managment: Goal: General experience of comfort will improve Outcome: Progressing   Problem: Safety: Goal: Ability to remain free from injury will improve Outcome: Progressing   Problem: Skin Integrity: Goal: Risk for impaired skin integrity will decrease Outcome: Progressing

## 2022-11-12 NOTE — TOC Transition Note (Addendum)
Transition of Care [facility]) - CM/SW Discharge Note   Patient Details  Name: Kaitlin Wagner MRN: [id number] Date of Birth: 1946-05-07  [facility] CM/SW Contact:  Allena Katz, LCSW Phone Number: 11/12/2022, 1:09 PM   Clinical Narrative:   Pt has orders to discharge with [facility]. Pt will transport and coordinate getting patient back to [facility]. Misty Stanley with [facility] notified.           Patient Goals and CMS Choice      Discharge Placement                         Discharge Plan and Services Additional resources added to the After Visit Summary for                                       Social Determinants of Health (SDOH) Interventions SDOH Screenings   Tobacco Use: Low Risk  (11/05/2022)     Readmission Risk Interventions     No data to display

## 2022-11-12 NOTE — Progress Notes (Signed)
Progress Note   Patient: Kaitlin Wagner ION:[id number] DOB: Nov 19, 1945 DOA: 11/04/2022     7 DOS: the patient was seen and examined on 11/12/2022      Subjective: Patient seen and examined at bedside this morning  she denied nausea vomiting chest pain or cough Renal function much better today Was originally planned for discharge yesterday however needed to undergo voiding trial before discharge She denies any acute overnight events     Brief hospital course   From HPI: "Kaitlin Wagner is a 77 y.o. female with medical history significant for type diabetes mellitus, dementia, GERD, hypertension and dyslipidemia, followed by [facility] hospice, who presented to the emergency room with acute onset of intractable nausea, vomiting and diarrhea for the last 3 days.  She has been having deteriorating renal functions and associated UTI.  [facility] her creatinine was 7 UA showed nitrites and bacteria.  Recommendation was for presentation to the ER given previous history of dehydration and severe AKI.  She denies any bilious vomitus or hematemesis.  No melena or bright red bleeding per rectum or abdominal pain.  No chest pain or palpitations.  No cough or wheezing or dyspnea.   ED Course: When she came to the ER, BP was 147/37 with otherwise normal vital signs.  Creatinine was 10.75 and a BUN of 140 with a CO2 of a glucose of 120 and 2 protein of 8.6 with alk phos of 171.  CBC showed leukocytosis of 15.8 with neutrophilia.  PT was 16.6 and INR 1.4.  Lactic acid was 0.9 and later 1.9. EKG as reviewed by me : EKG showed sinus rhythm with a rate of 83 with Q waves inferiorly anteroseptally. 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 Arta Silence on 11/06/2022 and patient's creatinine was 1.8 on 10/30/2022 -  Renal function close to baseline and therefore patient has been cleared by nephrology for discharge and to have outpatient follow-up   Urinary retention Patient was found to have urinary retention on arrival  Foley catheterization was done Has been given a trial of voiding. This was discussed with patient's PCP/geriatrician Dr. Arta Silence. According to geriatrician they do have bladder scanning capability at their facility and they will be able to monitor for any retention and if patient needs Foley they will place it at that time and they will make referral for urologist. Patient PCP have agreed for patient to be discharged if medically ready for this follow-up.  Acute gastroenteritis Completed a course of IV fluid - Continue on as needed antiemetics     Large ventral hernia Outpatient follow-up No indication for emergent surgery at this time   Hypokalemia-improved   Sepsis due to gram-negative UTI (HCC) - Sepsis manifested by tachypnea, tachycardia and leukocytosis. - Has completed a course of antibiotics   Metabolic acidosis -off bicarb drip - Will follow her CO2.   Type 2 diabetes mellitus with chronic kidney disease (HCC) - Continue supplement coverage with NovoLog.   Depression - We will continue Lexapro and Remeron.     DVT prophylaxis: Subcutaneous heparin.   Advanced Care Planning:  Code Status: The patient is DNR and has an out of facility DNR form but wanted to be intubated if she has respiratory failure/arrest without cardiac arrest.       Consults  called: Nephrology.     Physical Exam: GENERAL:  77 y.o.-year-old female patient lying in the bed with no acute distress.  EYES: Pupils equal, round, reactive to light and accommodation. No scleral icterus. Extraocular muscles intact.  HEENT: Head atraumatic, normocephalic. Oropharynx and nasopharynx clear.  NECK:  Supple, no jugular venous distention. No thyroid enlargement, no tenderness.  LUNGS: Normal breath  sounds bilaterally, no wheezing, rales,rhonchi or crepitation. No use of accessory muscles of respiration.  CARDIOVASCULAR: Regular rate and rhythm, S1, S2 normal. No murmurs, rubs, or gallops.  ABDOMEN: Soft, nondistended, nontender. Bowel sounds present. No organomegaly.  She has a large ventral nontender hernia. EXTREMITIES: No pedal edema, cyanosis, or clubbing.  NEUROLOGIC: Cranial nerves II through XII are intact. Muscle strength 5/5 in all extremities. Sensation intact. Gait not checked.  PSYCHIATRIC: The patient is alert and oriented x 3.  Normal affect and good eye contact. SKIN: No obvious rash, lesion, or ulcer.        Data Reviewed: Personally reviewed the patient's laboratory results today showing creatinine 2.3   Family Communication: Discussed with patient's daughter over the phone   Disposition: Status is: Inpatient Still needing nephrology consultation in the setting of acute renal failure       Time spent: 45 minutes      Vitals:   11/11/22 0757 11/11/22 1651 11/11/22 1924 11/12/22 0515  BP: 129/72 (!) 170/79 118/61 (!) 155/71  Pulse: 81 78 78 73  Resp: 18 18 20 15   Temp: 98.2 F (36.8 C) 98.7 F (37.1 C) 98.5 F (36.9 C) 97.9 F (36.6 C)  TempSrc: Oral  Oral Oral  SpO2: 95% 96% 97% 93%  Weight:      Height:         Author: Loyce Dys, MD 11/12/2022 12:16 PM  For on call review [email or web address].

## 2022-11-12 NOTE — Care Management Important Message (Signed)
Important Message  Patient Details  Name: Kaitlin Wagner MRN: [id number] Date of Birth: 07/19/1945   Medicare Important Message Given:  Yes     Johnell Comings 11/12/2022, 11:28 AM

## 2023-02-06 IMAGING — US US RENAL
1 series · 14 of 25 positions shown · non-contrast
Comparison: CT abdomen 07/31/2021

CLINICAL DATA: Acute renal failure

EXAM:
RENAL / URINARY TRACT ULTRASOUND COMPLETE

[Series 1: us renal · 14 of 37 slices shown]
[im 1/37]
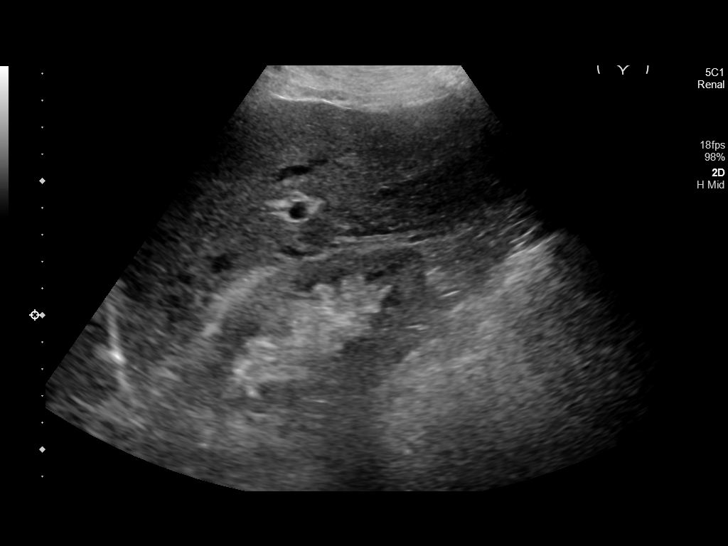
[im 4/37]
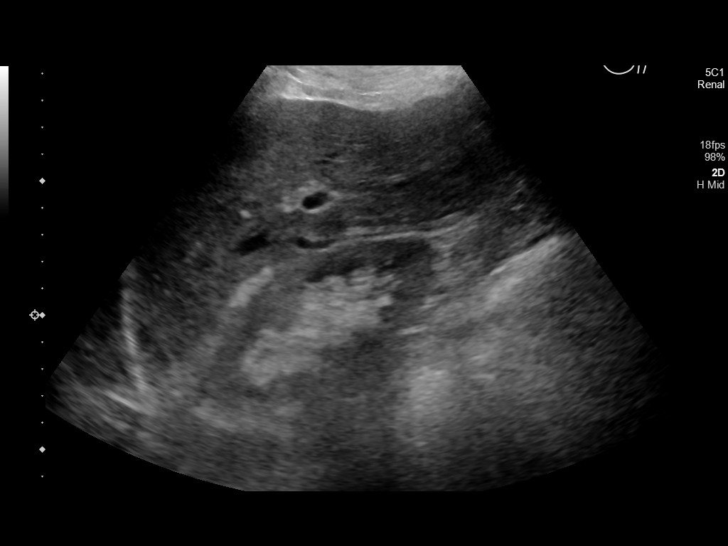
[im 7/37]
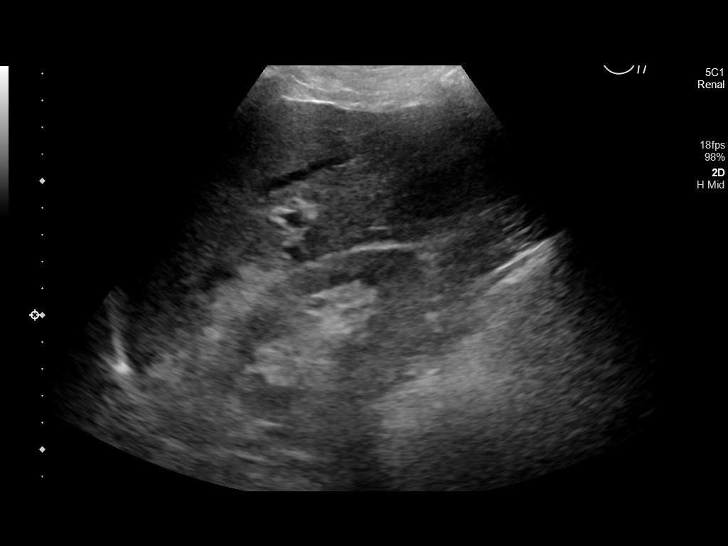
[im 10/37]
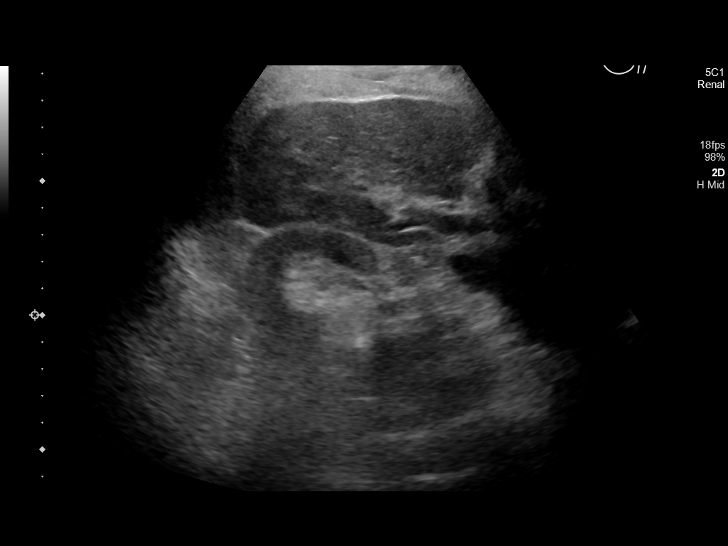
[im 13/37]
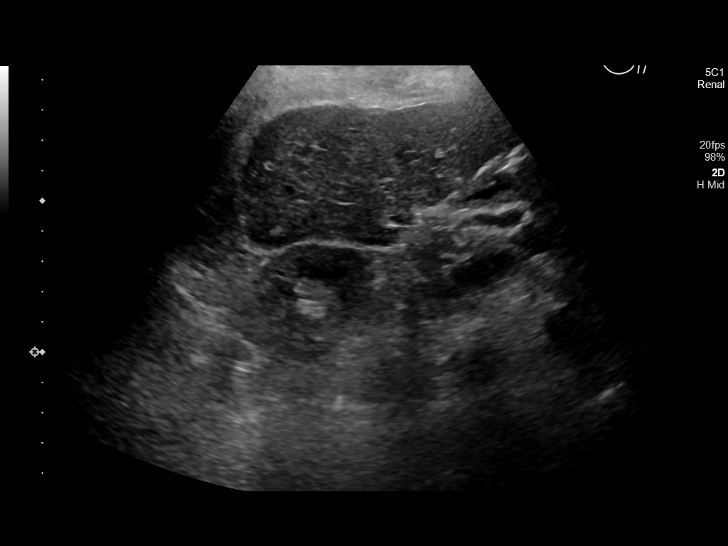
[im 14/37]
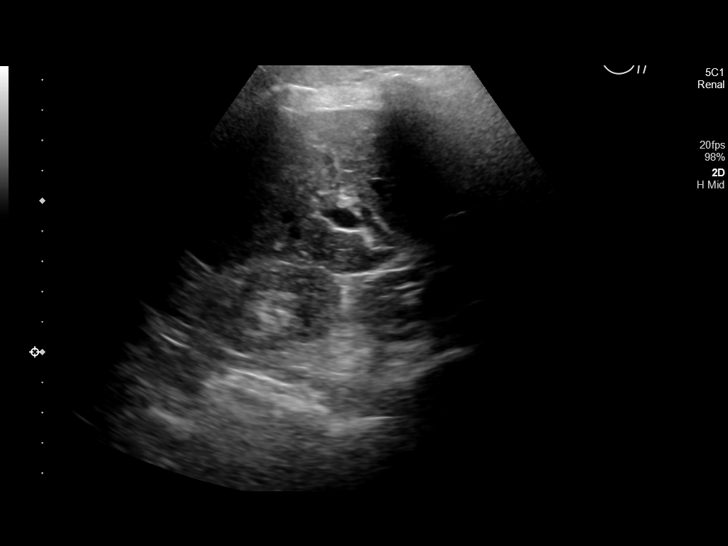
[im 17/37]
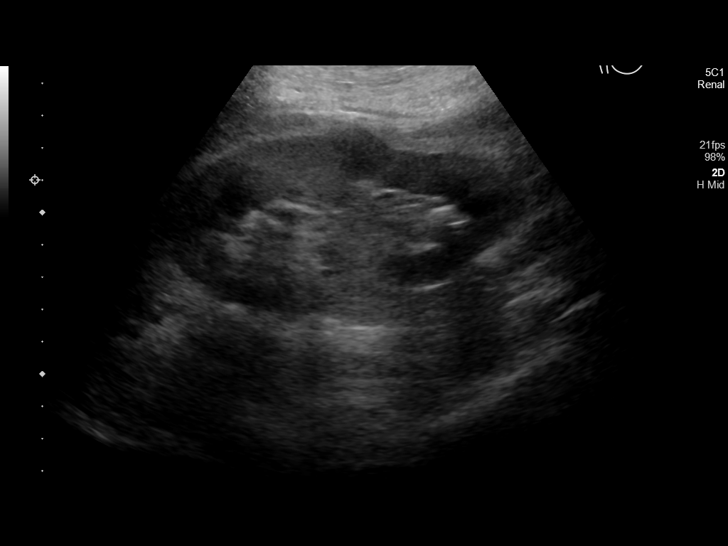
[im 20/37]
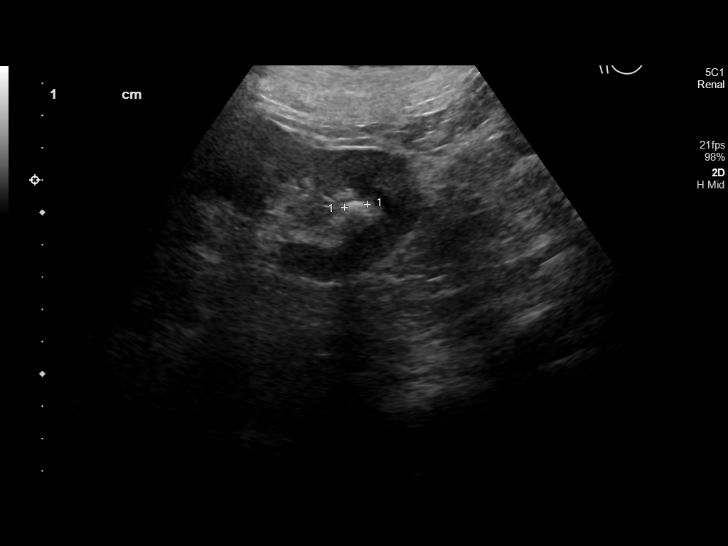
[im 23/37]
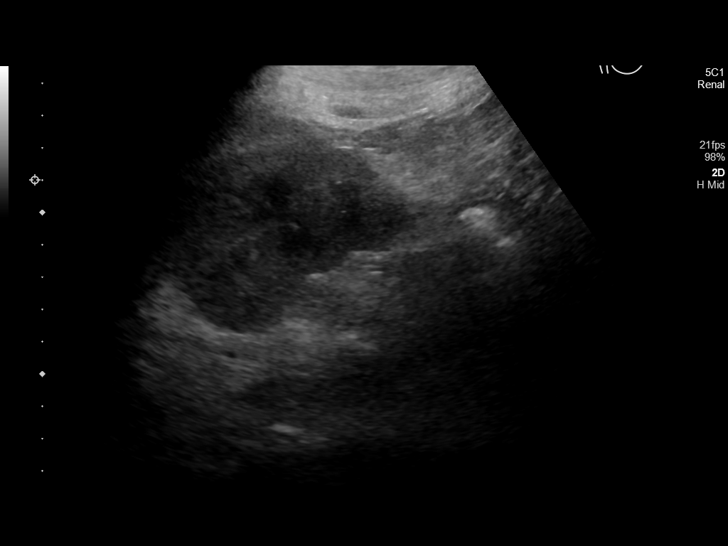
[im 25/37]
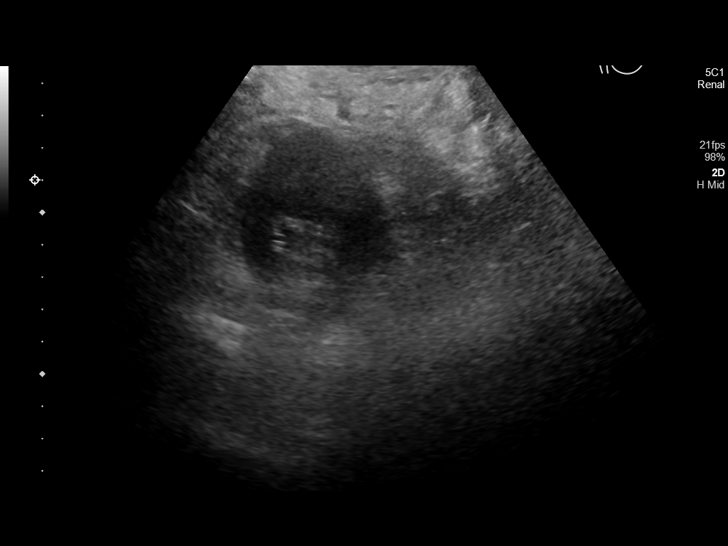
[im 28/37]
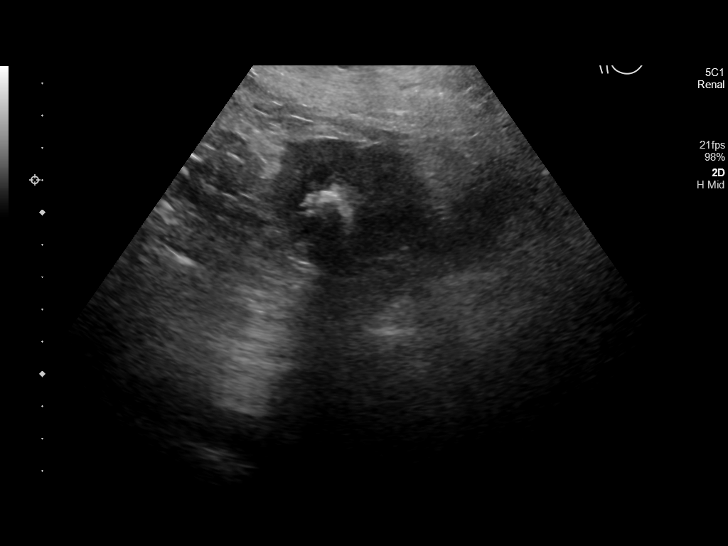
[im 31/37]
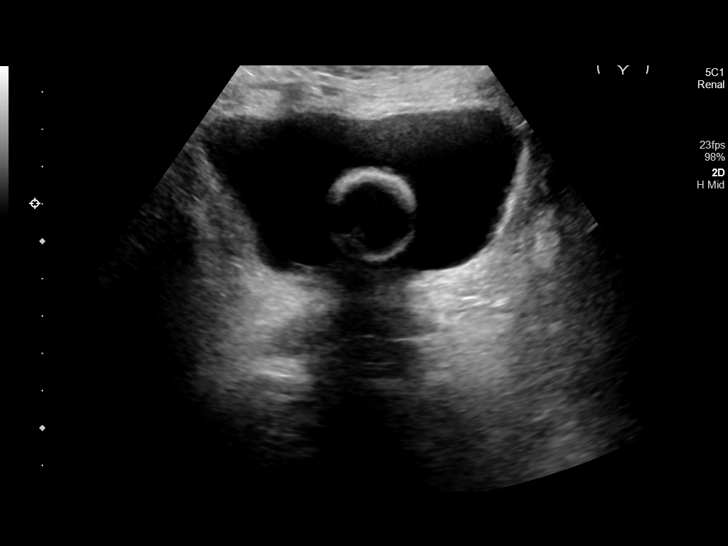
[im 34/37]
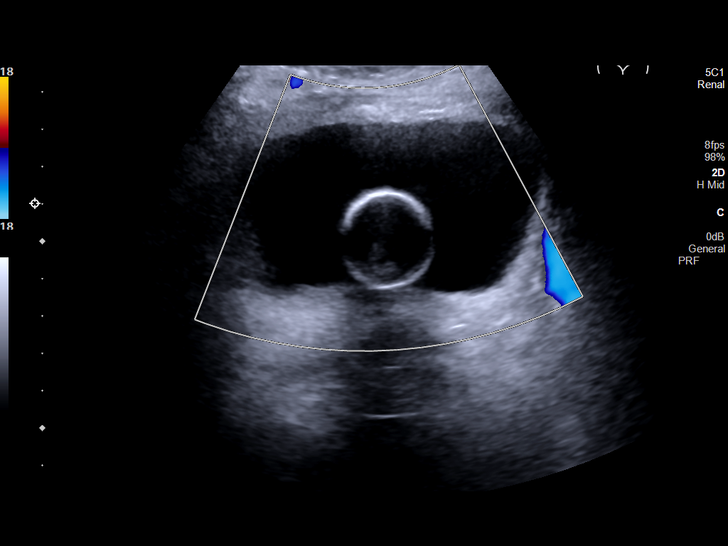
[im 37/37]
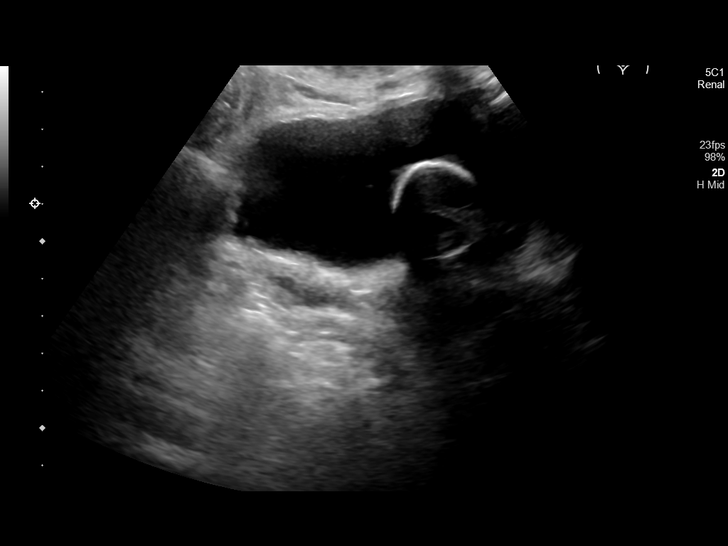

[14 of 25 positions shown; findings below may reference images not displayed]

FINDINGS: Right Kidney:

Renal measurements: 10.4 x 4.7 x 4.2 cm = volume: 108 solid mL.
Echogenicity within normal limits. No solid mass or hydronephrosis
visualized. 1.2 x 1.5 x 1.8 cm right renal cyst along the upper
pole.

Left Kidney:

Renal measurements: 11.4 x 4.6 x 4.8 cm = volume: 132 mL.
Echogenicity within normal limits. No mass or hydronephrosis
visualized.

Bladder:

Bladder is unremarkable with a Foley catheter present.

Other:

None.
IMPRESSION: 1. No obstructive uropathy.

## 2023-09-05 ENCOUNTER — Other Ambulatory Visit: Payer: Self-pay | Admitting: Internal Medicine

## 2023-09-05 ENCOUNTER — Other Ambulatory Visit: Payer: Self-pay | Admitting: Family Medicine

## 2023-09-05 DIAGNOSIS — M2560 Stiffness of unspecified joint, not elsewhere classified: Secondary | ICD-10-CM

## 2023-09-05 DIAGNOSIS — R252 Cramp and spasm: Secondary | ICD-10-CM

## 2023-09-08 ENCOUNTER — Inpatient Hospital Stay
Admission: EM | Admit: 2023-09-08 | Discharge: 2023-09-10 | DRG: 064 | Disposition: A | Discharge status: Home Health | Payer: Medicare (Managed Care) | Source: Ambulatory Visit | Attending: Internal Medicine | Admitting: Internal Medicine

## 2023-09-08 ENCOUNTER — Ambulatory Visit
Admission: RE | Admit: 2023-09-08 | Discharge: 2023-09-08 | Disposition: A | Discharge status: Home / Self Care | Payer: Medicare (Managed Care) | Source: Ambulatory Visit | Attending: Family Medicine | Admitting: Family Medicine

## 2023-09-08 ENCOUNTER — Observation Stay: Payer: Medicare (Managed Care)

## 2023-09-08 ENCOUNTER — Other Ambulatory Visit: Payer: Self-pay

## 2023-09-08 ENCOUNTER — Other Ambulatory Visit: Payer: Self-pay | Admitting: Family Medicine

## 2023-09-08 DIAGNOSIS — R29704 NIHSS score 4: Secondary | ICD-10-CM | POA: Diagnosis present

## 2023-09-08 DIAGNOSIS — I2510 Atherosclerotic heart disease of native coronary artery without angina pectoris: Secondary | ICD-10-CM | POA: Diagnosis present

## 2023-09-08 DIAGNOSIS — I639 Cerebral infarction, unspecified: Principal | ICD-10-CM | POA: Diagnosis present

## 2023-09-08 DIAGNOSIS — F03C0 Unspecified dementia, severe, without behavioral disturbance, psychotic disturbance, mood disturbance, and anxiety: Secondary | ICD-10-CM | POA: Diagnosis present

## 2023-09-08 DIAGNOSIS — I129 Hypertensive chronic kidney disease with stage 1 through stage 4 chronic kidney disease, or unspecified chronic kidney disease: Secondary | ICD-10-CM | POA: Diagnosis present

## 2023-09-08 DIAGNOSIS — R252 Cramp and spasm: Secondary | ICD-10-CM

## 2023-09-08 DIAGNOSIS — N390 Urinary tract infection, site not specified: Secondary | ICD-10-CM | POA: Diagnosis present

## 2023-09-08 DIAGNOSIS — I10 Essential (primary) hypertension: Secondary | ICD-10-CM | POA: Diagnosis present

## 2023-09-08 DIAGNOSIS — Z9049 Acquired absence of other specified parts of digestive tract: Secondary | ICD-10-CM

## 2023-09-08 DIAGNOSIS — E119 Type 2 diabetes mellitus without complications: Secondary | ICD-10-CM

## 2023-09-08 DIAGNOSIS — L89511 Pressure ulcer of right ankle, stage 1: Secondary | ICD-10-CM | POA: Diagnosis present

## 2023-09-08 DIAGNOSIS — Z7401 Bed confinement status: Secondary | ICD-10-CM

## 2023-09-08 DIAGNOSIS — Z7982 Long term (current) use of aspirin: Secondary | ICD-10-CM

## 2023-09-08 DIAGNOSIS — I63521 Cerebral infarction due to unspecified occlusion or stenosis of right anterior cerebral artery: Principal | ICD-10-CM | POA: Diagnosis present

## 2023-09-08 DIAGNOSIS — Z66 Do not resuscitate: Secondary | ICD-10-CM | POA: Diagnosis present

## 2023-09-08 DIAGNOSIS — L89322 Pressure ulcer of left buttock, stage 2: Secondary | ICD-10-CM | POA: Diagnosis present

## 2023-09-08 DIAGNOSIS — Z79899 Other long term (current) drug therapy: Secondary | ICD-10-CM

## 2023-09-08 DIAGNOSIS — K219 Gastro-esophageal reflux disease without esophagitis: Secondary | ICD-10-CM | POA: Diagnosis present

## 2023-09-08 DIAGNOSIS — F0393 Unspecified dementia, unspecified severity, with mood disturbance: Secondary | ICD-10-CM | POA: Diagnosis present

## 2023-09-08 DIAGNOSIS — N1831 Chronic kidney disease, stage 3a: Secondary | ICD-10-CM | POA: Diagnosis present

## 2023-09-08 DIAGNOSIS — I69351 Hemiplegia and hemiparesis following cerebral infarction affecting right dominant side: Secondary | ICD-10-CM

## 2023-09-08 DIAGNOSIS — E1122 Type 2 diabetes mellitus with diabetic chronic kidney disease: Secondary | ICD-10-CM | POA: Diagnosis present

## 2023-09-08 DIAGNOSIS — E785 Hyperlipidemia, unspecified: Secondary | ICD-10-CM | POA: Diagnosis present

## 2023-09-08 DIAGNOSIS — F0394 Unspecified dementia, unspecified severity, with anxiety: Secondary | ICD-10-CM | POA: Diagnosis present

## 2023-09-08 DIAGNOSIS — M4802 Spinal stenosis, cervical region: Secondary | ICD-10-CM | POA: Diagnosis present

## 2023-09-08 DIAGNOSIS — R29898 Other symptoms and signs involving the musculoskeletal system: Secondary | ICD-10-CM

## 2023-09-08 DIAGNOSIS — F32A Depression, unspecified: Secondary | ICD-10-CM | POA: Diagnosis present

## 2023-09-08 DIAGNOSIS — I700 Atherosclerosis of aorta: Secondary | ICD-10-CM | POA: Diagnosis present

## 2023-09-08 DIAGNOSIS — G6181 Chronic inflammatory demyelinating polyneuritis: Secondary | ICD-10-CM | POA: Diagnosis present

## 2023-09-08 DIAGNOSIS — B961 Klebsiella pneumoniae [K. pneumoniae] as the cause of diseases classified elsewhere: Secondary | ICD-10-CM | POA: Diagnosis present

## 2023-09-08 DIAGNOSIS — G8324 Monoplegia of upper limb affecting left nondominant side: Secondary | ICD-10-CM | POA: Diagnosis present

## 2023-09-08 DIAGNOSIS — R29810 Facial weakness: Secondary | ICD-10-CM | POA: Diagnosis present

## 2023-09-08 DIAGNOSIS — I440 Atrioventricular block, first degree: Secondary | ICD-10-CM | POA: Diagnosis present

## 2023-09-08 DIAGNOSIS — L89312 Pressure ulcer of right buttock, stage 2: Secondary | ICD-10-CM | POA: Diagnosis present

## 2023-09-08 DIAGNOSIS — L89153 Pressure ulcer of sacral region, stage 3: Secondary | ICD-10-CM | POA: Diagnosis present

## 2023-09-08 LAB — COMPREHENSIVE METABOLIC PANEL
ALT: 17 U/L (ref 0–44)
AST: 29 U/L (ref 15–41)
Albumin: 3.9 g/dL (ref 3.5–5.0)
Alkaline Phosphatase: 97 U/L (ref 38–126)
Anion gap: 12 (ref 5–15)
BUN: 19 mg/dL (ref 8–23)
CO2: 26 mmol/L (ref 22–32)
Calcium: 9.4 mg/dL (ref 8.9–10.3)
Chloride: 105 mmol/L (ref 98–111)
Creatinine, Ser: 1.46 mg/dL — ABNORMAL HIGH (ref 0.44–1.00)
GFR, Estimated: 37 mL/min — ABNORMAL LOW (ref >60)
Glucose, Bld: 108 mg/dL — ABNORMAL HIGH (ref 70–99)
Potassium: 3.7 mmol/L (ref 3.5–5.1)
Sodium: 143 mmol/L (ref 135–145)
Total Bilirubin: 1.4 mg/dL — ABNORMAL HIGH (ref 0.0–1.2)
Total Protein: 7.8 g/dL (ref 6.5–8.1)

## 2023-09-08 LAB — DIFFERENTIAL
Abs Immature Granulocytes: 0.05 10*3/uL (ref 0.00–0.07)
Basophils Absolute: 0 10*3/uL (ref 0.0–0.1)
Basophils Relative: 0 %
Eosinophils Absolute: 0.1 10*3/uL (ref 0.0–0.5)
Eosinophils Relative: 1 %
Immature Granulocytes: 0 %
Lymphocytes Relative: 8 %
Lymphs Abs: 1.1 10*3/uL (ref 0.7–4.0)
Monocytes Absolute: 0.7 10*3/uL (ref 0.1–1.0)
Monocytes Relative: 5 %
Neutro Abs: 11.6 10*3/uL — ABNORMAL HIGH (ref 1.7–7.7)
Neutrophils Relative %: 86 %

## 2023-09-08 LAB — HEMOGLOBIN A1C
Hgb A1c MFr Bld: 5.4 % (ref 4.8–5.6)
Mean Plasma Glucose: 108.28 mg/dL

## 2023-09-08 LAB — CBC
HCT: 38 % (ref 36.0–46.0)
Hemoglobin: 12.2 g/dL (ref 12.0–15.0)
MCH: 30.2 pg (ref 26.0–34.0)
MCHC: 32.1 g/dL (ref 30.0–36.0)
MCV: 94.1 fL (ref 80.0–100.0)
Platelets: 184 10*3/uL (ref 150–400)
RBC: 4.04 MIL/uL (ref 3.87–5.11)
RDW: 14.6 % (ref 11.5–15.5)
WBC: 13.6 10*3/uL — ABNORMAL HIGH (ref 4.0–10.5)
nRBC: 0 % (ref 0.0–0.2)

## 2023-09-08 LAB — APTT: aPTT: 30 s (ref 24–36)

## 2023-09-08 LAB — PROTIME-INR
INR: 1.1 (ref 0.8–1.2)
Prothrombin Time: 14 s (ref 11.4–15.2)

## 2023-09-08 LAB — ETHANOL: Alcohol, Ethyl (B): <10 mg/dL (ref <10)

## 2023-09-08 MED ORDER — ACETAMINOPHEN 160 MG/5ML PO SOLN: 650.0000 mg | ORAL | Status: DC | PRN

## 2023-09-08 MED ORDER — SODIUM CHLORIDE 0.9% FLUSH: 3.0000 mL | INTRAVENOUS | Status: DC | PRN

## 2023-09-08 MED ORDER — LORAZEPAM 2 MG/ML IJ SOLN
0.5000 mg | Freq: Four times a day (QID) | INTRAMUSCULAR | Status: DC | PRN
Start: 2023-09-08 — End: 2023-09-10
  Administered 2023-09-09: 0.5 mg via INTRAVENOUS
  Filled 2023-09-08: qty 1

## 2023-09-08 MED ORDER — SODIUM CHLORIDE 0.9% FLUSH
3.0000 mL | Freq: Two times a day (BID) | INTRAVENOUS | Status: DC
  Administered 2023-09-08 – 2023-09-09 (×2): 10 mL via INTRAVENOUS
  Administered 2023-09-09: 5 mL via INTRAVENOUS
  Administered 2023-09-10: 10 mL via INTRAVENOUS

## 2023-09-08 MED ORDER — SENNOSIDES-DOCUSATE SODIUM 8.6-50 MG PO TABS: 1.0000 | ORAL_TABLET | Freq: Every evening | ORAL | Status: DC | PRN

## 2023-09-08 MED ORDER — ASPIRIN 81 MG PO TBEC
81.0000 mg | DELAYED_RELEASE_TABLET | Freq: Every day | ORAL | Status: DC
Start: 2023-09-08 — End: 2023-09-08

## 2023-09-08 MED ORDER — HYDRALAZINE HCL 20 MG/ML IJ SOLN: 5.0000 mg | Freq: Four times a day (QID) | INTRAMUSCULAR | Status: DC | PRN

## 2023-09-08 MED ORDER — ESCITALOPRAM OXALATE 10 MG PO TABS
5.0000 mg | ORAL_TABLET | Freq: Every day | ORAL | Status: DC
  Administered 2023-09-09 – 2023-09-10 (×2): 5 mg via ORAL
  Filled 2023-09-08 (×2): qty 1

## 2023-09-08 MED ORDER — MIRTAZAPINE 15 MG PO TABS
7.5000 mg | ORAL_TABLET | Freq: Every day | ORAL | Status: DC
  Administered 2023-09-08 – 2023-09-09 (×2): 7.5 mg via ORAL
  Filled 2023-09-08 (×2): qty 1

## 2023-09-08 MED ORDER — ACETAMINOPHEN 325 MG PO TABS: 650.0000 mg | ORAL_TABLET | ORAL | Status: DC | PRN

## 2023-09-08 MED ORDER — GADOBUTROL 1 MMOL/ML IV SOLN: 6.0000 mL | Freq: Once | INTRAVENOUS | Status: DC | PRN

## 2023-09-08 MED ORDER — ASPIRIN 81 MG PO CHEW: 324.0000 mg | CHEWABLE_TABLET | Freq: Once | ORAL | Status: DC

## 2023-09-08 MED ORDER — CLOPIDOGREL BISULFATE 75 MG PO TABS
75.0000 mg | ORAL_TABLET | Freq: Every day | ORAL | Status: DC
  Administered 2023-09-09 – 2023-09-10 (×2): 75 mg via ORAL
  Filled 2023-09-08 (×2): qty 1

## 2023-09-08 MED ORDER — ACETAMINOPHEN 650 MG RE SUPP: 650.0000 mg | RECTAL | Status: DC | PRN

## 2023-09-08 MED ORDER — ESOMEPRAZOLE MAGNESIUM 10 MG PO PACK: 10.0000 mg | PACK | Freq: Two times a day (BID) | ORAL | Status: DC

## 2023-09-08 MED ORDER — STROKE: EARLY STAGES OF RECOVERY BOOK: Freq: Once | Status: AC

## 2023-09-08 MED ORDER — ASPIRIN 81 MG PO TBEC
81.0000 mg | DELAYED_RELEASE_TABLET | Freq: Every day | ORAL | Status: DC
Start: 2023-09-09 — End: 2023-09-10
  Administered 2023-09-09 – 2023-09-10 (×2): 81 mg via ORAL
  Filled 2023-09-08 (×2): qty 1

## 2023-09-08 MED ORDER — SODIUM CHLORIDE 0.9% FLUSH: 3.0000 mL | Freq: Once | INTRAVENOUS | Status: DC

## 2023-09-08 MED ORDER — AMOXICILLIN-POT CLAVULANATE 500-125 MG PO TABS
1.0000 | ORAL_TABLET | Freq: Two times a day (BID) | ORAL | Status: DC
  Administered 2023-09-08 – 2023-09-10 (×4): 1 via ORAL
  Filled 2023-09-08 (×4): qty 1

## 2023-09-08 MED ORDER — HEPARIN SODIUM (PORCINE) 5000 UNIT/ML IJ SOLN
5000.0000 [IU] | Freq: Two times a day (BID) | INTRAMUSCULAR | Status: DC
  Administered 2023-09-08 – 2023-09-10 (×4): 5000 [IU] via SUBCUTANEOUS
  Filled 2023-09-08 (×4): qty 1

## 2023-09-08 MED ORDER — OMEPRAZOLE 20 MG PO TBDD
20.0000 mg | DELAYED_RELEASE_TABLET | Freq: Two times a day (BID) | ORAL | Status: DC
  Administered 2023-09-09 – 2023-09-10 (×3): 20 mg via ORAL
  Filled 2023-09-08 (×5): qty 1

## 2023-09-08 NOTE — ED Notes (Signed)
 Transport here to take pt to room 105

## 2023-09-08 NOTE — Progress Notes (Signed)
 PHARMACY NOTE:  ANTIMICROBIAL RENAL DOSAGE ADJUSTMENT  Current antimicrobial regimen includes a mismatch between antimicrobial dosage and estimated renal function.  As per policy approved by the Pharmacy & Therapeutics and Medical Executive Committees, the antimicrobial dosage will be adjusted accordingly.  Current antimicrobial dosage:  Augmentin 875 mg PO BID   Indication:   Renal Function:  Estimated Creatinine Clearance: 27.4 mL/min (A) (by C-G formula based on SCr of 1.46 mg/dL (H)). []      On intermittent HD, scheduled: []      On CRRT    Antimicrobial dosage has been changed to:  Augmentin 500 mg PO BID   Additional comments:   Thank you for allowing pharmacy to be a part of this patient's care.  Scherrie Gerlach, [facility] 09/08/2023 10:43 PM

## 2023-09-08 NOTE — ED Notes (Signed)
 Caregiver states pt has not been using left side for 1 week.  Pt denies any pain.  No headache or chest pain.  Pt in hallway bed.  Dr Arnoldo Morale with pt.

## 2023-09-08 NOTE — ED Triage Notes (Signed)
 Arrives from MRI, patient had outpatient MRI which showed a stroke of unknown age.  Sent to ED for further evaluation.  AAOx3.  Skin warm and dry. NAD

## 2023-09-08 NOTE — ED Notes (Signed)
 Pt alert, care giver with pt.  Pt to room 105

## 2023-09-08 NOTE — Progress Notes (Addendum)
 Pt admitted to unit via bed from ER. Pt alert to self only. This is her baseline according to her daughter and POA [name] who is at the bedside. Pt has hx of CVA with right sided weakness, however, today she is showing left sided weakness. She is bedbound at home, utilizes a wheelchair which is at bedside. Bed locked and low, call bell in place, pt states she is tired at this time. Pt is NPO at this time, per stroke protocol. Speech to see her in the morning.

## 2023-09-08 NOTE — ED Provider Notes (Signed)
 [facility] Provider Note    Event Date/Time   First MD Initiated Contact with Patient 09/08/23 1550     (approximate)   History   Stroke   HPI  Kaitlin Wagner is a 78 y.o. female past medical history significant for diabetes, dementia, GERD, hypertension, hyperlipidemia, currently under hospice with [facility], presents to the emergency department after a positive MRI finding.  Patient had an outpatient MRI to workup of stroke -found to be positive so sent to the emergency department.  History is provided by caregiver at bedside.  States that on Monday and Tuesday of last week she intermittently started to not use her left arm.  States that she will use it in the morning but then would want you to use her left hand as the day went on.  They were concerned that she may have had a new stroke or mini stroke so they had scheduled her for an MRI.  Patient had an MRI today and was positive for stroke.  They do want admission and evaluation to workup stroke.  Hospice team is aware of the patient coming into the emergency department and being admitted to the hospital.  Patient without complaints at this time.     Physical Exam   Triage Vital Signs: ED Triage Vitals  Encounter Vitals Group     BP 09/08/23 1230 (!) 145/65     Systolic BP Percentile --      Diastolic BP Percentile --      Pulse Rate 09/08/23 1230 77     Resp 09/08/23 1230 18     Temp 09/08/23 1230 98 F (36.7 C)     Temp src --      SpO2 09/08/23 1230 100 %     Weight --      Height --      Head Circumference --      Peak Flow --      Pain Score 09/08/23 1229 0     Pain Loc --      Pain Education --      Exclude from Growth Chart --     Most recent vital signs: Vitals:   09/08/23 1230 09/08/23 1600  BP: (!) 145/65 (!) 182/72  Pulse: 77 72  Resp: 18   Temp: 98 F (36.7 C)   SpO2: 100% 93%    Physical Exam Constitutional:      Appearance: She is well-developed.  HENT:     Head:  Atraumatic.  Eyes:     Conjunctiva/sclera: Conjunctivae normal.  Cardiovascular:     Rate and Rhythm: Regular rhythm.  Pulmonary:     Effort: No respiratory distress.  Abdominal:     General: There is no distension.  Musculoskeletal:        General: Normal range of motion.     Cervical back: Normal range of motion.  Skin:    General: Skin is warm.  Neurological:     Mental Status: She is alert. Mental status is at baseline. She is disoriented.     Comments: Patient with contractures and weakness chronically to the right side.  Good grip strength to the left side.  Difficulty participating with assessing strength to the left upper extremity.  Does not hold either lower extremities up to gravity.     IMPRESSION / MDM / ASSESSMENT AND PLAN / ED COURSE  I reviewed the triage vital signs and the nursing notes.  Differential diagnosis concerning for acute CVA.  I reviewed MRI that was done as an outpatient.  Positive for acute versus subacute ACA stroke.  Also noted underlying severe signal changes in the brain concerning for advanced small vessel disease or possible chronic Demyelinating disease.  Also on Dorst cervical spine degeneration with significant stenosis at C3 and C4 but no obvious cord signal but does have significant cord mass effect.  Of note patient also currently on Augmentin for urinary tract infection.  Differential diagnosis including electrolyte abnormality, CVA, dehydration  EKG  I, Corena Herter, the attending physician, personally viewed and interpreted this ECG.  EKG with underlying artifact.  First-degree AV block with PR of 218.  Narrow complex.  Normal QTc.  No significant ST elevation or depression.  No findings of acute ischemia or dysrhythmia.    LABS (all labs ordered are listed, but only abnormal results are displayed) Labs interpreted as -    Labs Reviewed  CBC - Abnormal; Notable for the following components:      Result Value   WBC 13.6 (*)     All other components within normal limits  DIFFERENTIAL - Abnormal; Notable for the following components:   Neutro Abs 11.6 (*)    All other components within normal limits  COMPREHENSIVE METABOLIC PANEL - Abnormal; Notable for the following components:   Glucose, Bld 108 (*)    Creatinine, Ser 1.46 (*)    Total Bilirubin 1.4 (*)    GFR, Estimated 37 (*)    All other components within normal limits  PROTIME-INR  APTT  ETHANOL  CBG MONITORING, ED     MDM  Patient presents to the emergency department for acute CVA.  Symptom onset approximately 1 week ago.  MRI outpatient reviewed.  Positive for acute stroke.  Mild leukocytosis.  Creatinine appears to be at her baseline.  No significant electrolyte abnormality.  Consulted hospitalist for admission for acute CVA.     PROCEDURES:  Critical Care performed: No  Procedures  Patient's presentation is most consistent with acute presentation with potential threat to life or bodily function.   MEDICATIONS ORDERED IN ED: Medications  sodium chloride flush (NS) 0.9 % injection 3 mL (has no administration in time range)    FINAL CLINICAL IMPRESSION(S) / ED DIAGNOSES   Final diagnoses:  Cerebrovascular accident (CVA), unspecified mechanism (HCC)  Left arm weakness     Rx / DC Orders   ED Discharge Orders     None        Note:  This document was prepared using Dragon voice recognition software and may include unintentional dictation errors.   Corena Herter, MD 09/08/23 [phone]

## 2023-09-08 NOTE — ED Notes (Signed)
 Pt to xray

## 2023-09-08 NOTE — ED Triage Notes (Addendum)
 Pt comes from [facility] with result of + stroke. Pt had symptoms week ago and the MRI was ordered.  Pt denies any dizziness, slurred speech, blurry vision or cp.   Homehealth nurse with pt and states to call [facility] when pt is ready for discharge.

## 2023-09-08 NOTE — H&P (Addendum)
 History and Physical    Kaitlin Wagner ZOX:[id number] DOB: 1945-09-03 DOA: 09/08/2023  PCP: Gwendlyn Deutscher, MD (Confirm with patient/family/NH records and if not entered, this has to be entered at [facility] point of entry) Patient coming from: SNF  I have personally briefly reviewed patient's old medical records in [facility] Link  Chief Complaint: Left arm weakness  HPI: Kaitlin Wagner is a 78 y.o. female with medical history significant of stroke with right-sided weakness and contracture, advanced dementia, HTN, HLD, IIDM, CKD stage IIIa-b, presented with strokelike symptoms.  Patient has a chronic right-sided weakness after previous stroke however 7 days ago family noticed that the patient developed and new left-sided weakness and a outpatient MRI was ordered and today the MRI resulted showing right ACA territory stroke subacute.  Denies any speech problems no cough or choking after eating or drink.  Mentation at baseline as per family. Her PCP Dr. Vertell Novak [phone]) told me over the phone that patient appears to develop episodes of flaccid weakness of right upper arm alternated with rigidity for the last 5 days and PCP wonders whether patient had seizure triggered by stroke.  Patient was diagnosed with Klebsiella UTI 2 days ago and nursing home.  ED Course: Afebrile, nontachycardic blood pressure slightly elevated SBP 140-160.  Blood work showed hemoglobin 13.6, creatinine 1.4 about her baseline BUN 19.  Review of Systems: Unable to perform, patient has baseline dementia  Past Medical History:  Diagnosis Date   Aortic atherosclerosis (HCC) 08/05/2021   Cancer (HCC) colon   Diabetes mellitus without complication (HCC)    GERD (gastroesophageal reflux disease)    Hyperlipidemia    Hypertension     Past Surgical History:  Procedure Laterality Date   CHOLECYSTECTOMY     colon resection     laparoscopic   COLON SURGERY     COLONOSCOPY WITH PROPOFOL N/A 05/12/2015   Procedure:  COLONOSCOPY WITH PROPOFOL;  Surgeon: Wallace Cullens, MD;  Location: [facility] ENDOSCOPY;  Service: Gastroenterology;  Laterality: N/A;   COLONOSCOPY WITH PROPOFOL N/A 05/12/2018   Procedure: COLONOSCOPY WITH PROPOFOL;  Surgeon: Toledo, Boykin Nearing, MD;  Location: [facility] ENDOSCOPY;  Service: Gastroenterology;  Laterality: N/A;   ESOPHAGOGASTRODUODENOSCOPY N/A 05/12/2015   Procedure: ESOPHAGOGASTRODUODENOSCOPY (EGD);  Surgeon: Wallace Cullens, MD;  Location: [facility] ENDOSCOPY;  Service: Gastroenterology;  Laterality: N/A;     reports that she has never smoked. She has never used smokeless tobacco. She reports that she does not drink alcohol and does not use drugs.  No Known Allergies  History reviewed. No pertinent family history.   Prior to Admission medications   Medication Sig Start Date End Date Taking? Authorizing Provider  Acetaminophen Childrens 160 MG/5ML SOLN Take 20 mLs by mouth 3 (three) times daily as needed for pain. 04/28/20   [provider]  ANTI-ITCH lotion Apply 1 Application topically as needed. 10/29/22   [provider]  aspirin EC 81 MG tablet Take 81 mg by mouth daily.    [provider]  escitalopram (LEXAPRO) 5 MG tablet Take 5 mg by mouth daily. 10/30/22   [provider]  esomeprazole (NEXIUM) 10 MG packet Take 10 mg by mouth 2 (two) times daily.    [provider]  mirtazapine (REMERON) 7.5 MG tablet Take 1 tablet (7.5 mg total) by mouth at bedtime. 08/16/21   Amin, Ankit C, MD  nystatin (MYCOSTATIN/NYSTOP) powder Apply 1 Application topically 3 (three) times daily. 07/04/22   [provider]  Prenatal Vit-Fe  Fumarate-FA (PREPLUS) 27-1 MG TABS Take 1 tablet by mouth daily. 05/05/20   [provider]    Physical Exam: Vitals:   09/08/23 1230 09/08/23 1600  BP: (!) 145/65 (!) 182/72  Pulse: 77 72  Resp: 18   Temp: 98 F (36.7 C)   SpO2: 100% 93%    Constitutional: NAD, calm, comfortable Vitals:   09/08/23 1230 09/08/23  1600  BP: (!) 145/65 (!) 182/72  Pulse: 77 72  Resp: 18   Temp: 98 F (36.7 C)   SpO2: 100% 93%   Eyes: PERRL, lids and conjunctivae normal ENMT: Mucous membranes are moist. Posterior pharynx clear of any exudate or lesions.Normal dentition.  Neck: normal, supple, no masses, no thyromegaly Respiratory: clear to auscultation bilaterally, no wheezing, no crackles. Normal respiratory effort. No accessory muscle use.  Cardiovascular: Regular rate and rhythm, no murmurs / rubs / gallops. No extremity edema. 2+ pedal pulses. No carotid bruits.  Abdomen: no tenderness, no masses palpated. No hepatosplenomegaly. Bowel sounds positive.  Musculoskeletal: no clubbing / cyanosis. No joint deformity upper and lower extremities. Good ROM, no contractures. Normal muscle tone.  Skin: no rashes, lesions, ulcers. No induration Neurologic: CN 2-12 grossly intact. Sensation intact, DTR normal. Strength 3/5 in left arm, right arm contracted chronically Psychiatric: Awake, confused    Labs on Admission: I have personally reviewed following labs and imaging studies  CBC: Recent Labs  Lab 09/08/23 1232  WBC 13.6*  NEUTROABS 11.6*  HGB 12.2  HCT 38.0  MCV 94.1  PLT 184   Basic Metabolic Panel: Recent Labs  Lab 09/08/23 1232  NA 143  K 3.7  CL 105  CO2 26  GLUCOSE 108*  BUN 19  CREATININE 1.46*  CALCIUM 9.4   GFR: CrCl cannot be calculated (Unknown ideal weight.). Liver Function Tests: Recent Labs  Lab 09/08/23 1232  AST 29  ALT 17  ALKPHOS 97  BILITOT 1.4*  PROT 7.8  ALBUMIN 3.9   No results for input(s): "LIPASE", "AMYLASE" in the last 168 hours. No results for input(s): "AMMONIA" in the last 168 hours. Coagulation Profile: Recent Labs  Lab 09/08/23 1232  INR 1.1   Cardiac Enzymes: No results for input(s): "CKTOTAL", "CKMB", "CKMBINDEX", "TROPONINI" in the last 168 hours. BNP (last 3 results) No results for input(s): "PROBNP" in the last 8760 hours. HbA1C: No results  for input(s): "HGBA1C" in the last 72 hours. CBG: No results for input(s): "GLUCAP" in the last 168 hours. Lipid Profile: No results for input(s): "CHOL", "HDL", "LDLCALC", "TRIG", "CHOLHDL", "LDLDIRECT" in the last 72 hours. Thyroid Function Tests: No results for input(s): "TSH", "T4TOTAL", "FREET4", "T3FREE", "THYROIDAB" in the last 72 hours. Anemia Panel: No results for input(s): "VITAMINB12", "FOLATE", "FERRITIN", "TIBC", "IRON", "RETICCTPCT" in the last 72 hours. Urine analysis:    Component Value Date/Time   COLORURINE YELLOW (A) 11/04/2022 2250   APPEARANCEUR TURBID (A) 11/04/2022 2250   LABSPEC 1.010 11/04/2022 2250   PHURINE 5.0 11/04/2022 2250   GLUCOSEU NEGATIVE 11/04/2022 2250   HGBUR MODERATE (A) 11/04/2022 2250   BILIRUBINUR NEGATIVE 11/04/2022 2250   KETONESUR NEGATIVE 11/04/2022 2250   PROTEINUR 100 (A) 11/04/2022 2250   NITRITE NEGATIVE 11/04/2022 2250   LEUKOCYTESUR LARGE (A) 11/04/2022 2250    Radiological Exams on Admission: MR BRAIN WO CONTRAST Result Date: 09/08/2023 CLINICAL DATA:  78 year old female referred for increasing weakness late last week. Reportedly is wheelchair bound with dementia, spasticity. EXAM: MRI HEAD WITHOUT CONTRAST TECHNIQUE: Multiplanar, multiecho pulse sequences of the brain and  surrounding structures were obtained without intravenous contrast. CONTRAST:  A study without and with contrast was planned, but despite multiple attempts no IV access could be obtained and no contrast was administered. COMPARISON:  No prior brain imaging. FINDINGS: Brain: Patchy and confluent restricted diffusion throughout the medial right hemisphere corresponding to the right ACA vascular territory (series 15, image 42). Some direct involvement of the corpus callosum. No evidence of hemorrhagic transformation or mass effect. No other areas of diffusion restriction. But chronic severe T2 and FLAIR signal abnormality elsewhere in both hemispheres which most resembles  advanced chronic ischemic disease. This includes numerous small areas of cystic encephalomalacia favored to be chronic lacunar infarcts in the bilateral deep gray nuclei, deep white matter capsules. The main differential consideration is severe chronic demyelinating disease. But on SWI there is comparatively mild chronic hemosiderin in those areas. Possible small chronic microhemorrhage also in the left deep cerebellar nuclei. Mild to moderate patchy T2 heterogeneity in the pons. No definite cortical encephalomalacia. No midline shift, mass effect, evidence of mass lesion, ventriculomegaly, extra-axial collection or acute intracranial hemorrhage. Cervicomedullary junction and pituitary are within normal limits. Vascular: Major intracranial vascular flow voids are preserved. Skull and upper cervical spine: Normal background bone marrow signal. 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 No obvious cord signal abnormality. Sinuses/Orbits: Negative.  Paranasal sinuses are well aerated. Other: Mastoids are well aerated.  Negative visible scalp and face. IMPRESSION: 1. Positive for Acute to subacute Right ACA territory infarct. No hemorrhagic transformation or mass effect. 2. Underlying severe chronic signal changes in the brain favored to be advanced chronic small vessel disease. With main differential consideration of severe chronic demyelinating disease. 3. 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 Spinal cord mass effect there, but no cord signal abnormality is evident. Study discussed by telephone with [name] PA on 09/08/2023 at 1153 hours. Electronically Signed   By: Odessa Fleming M.D.   On: 09/08/2023 12:10    EKG: Independently reviewed.  Regular rhythm, baseline variation cannot  determine P waves, appears to have no significant ST changes, diffuse T wave flattening  Assessment/Plan Principal Problem:   Stroke [facility]) Active Problems:   Stroke (cerebrum) (HCC)  (please populate well all problems here in Problem List. (For example, if patient is on BP meds at home and you resume or decide to hold them, it is a problem that needs to be her. Same for CAD, COPD, HLD and so on)  Subacute stroke Left-sided paresis -Subacute left ACA stroke -Patient already on aspirin, will add Plavix, aiming at 3 weeks dual antiplatelet therapy then go back to aspirin alone therapy -Out of window for permissive hypertension, add hydralazine -Echocardiogram -MRA of brain and neck -Telemonitoring x 24 hours -PT OT and speech evaluation -Neurology consulted  Question of seizure -Her PCP observed episodes of flaccid weakness of right upper arm alternated with rigidity for the last 5 days and PCP wonders whether patient had seizure triggered by stroke and requested that a EEG to be done. -Precaution  Incidental finding of chronic demyelination process -Probably related to dementia, do not expect further workup as patient has advanced dementia and in hospice care.  Leukocytosis Recently diagnosed Klebsiella UTI -PCP reported that the patient was diagnosed with Klebsiella UTI and has been on Augmentin 2 days ago, we will  continue Augmentin 875 mg twice daily -No cough, will check chest x-ray  HTN -As above  CKD stage IIIa-B -Euvolemic, creatinine level stable, no acute concerns  Anxiety/depression -Continue Remeron and Lexapro  CODE STATUS -Confirmed with family and PCP patient is DNR/DNI.  Patient however is not currently on hospice care.  [facility] program reception desk [phone] in case more information needed.  DVT prophylaxis: Heparin 2 Code Status: DNR/DNI Family Communication: Daughter at bedside, PCP over phone. Disposition Plan: Expect less than 2 midnight hospital  stay Consults called: Neurology Admission status: Telemetry observation   Emeline General MD [facility] Pager [phone]  09/08/2023, 4:48 PM

## 2023-09-09 ENCOUNTER — Observation Stay: Payer: Medicare (Managed Care)

## 2023-09-09 ENCOUNTER — Inpatient Hospital Stay: Payer: Medicare (Managed Care)

## 2023-09-09 ENCOUNTER — Inpatient Hospital Stay
Admit: 2023-09-09 | Discharge: 2023-09-09 | Disposition: A | Discharge status: Home / Self Care | Payer: Medicare (Managed Care) | Attending: Internal Medicine

## 2023-09-09 ENCOUNTER — Inpatient Hospital Stay (HOSPITAL_COMMUNITY)
Admit: 2023-09-09 | Discharge: 2023-09-09 | Disposition: A | Discharge status: Home / Self Care | Payer: Medicare (Managed Care) | Attending: Internal Medicine

## 2023-09-09 DIAGNOSIS — F0393 Unspecified dementia, unspecified severity, with mood disturbance: Secondary | ICD-10-CM | POA: Diagnosis present

## 2023-09-09 DIAGNOSIS — I69351 Hemiplegia and hemiparesis following cerebral infarction affecting right dominant side: Secondary | ICD-10-CM | POA: Diagnosis not present

## 2023-09-09 DIAGNOSIS — L89511 Pressure ulcer of right ankle, stage 1: Secondary | ICD-10-CM | POA: Diagnosis present

## 2023-09-09 DIAGNOSIS — I6611 Occlusion and stenosis of right anterior cerebral artery: Secondary | ICD-10-CM

## 2023-09-09 DIAGNOSIS — I639 Cerebral infarction, unspecified: Secondary | ICD-10-CM | POA: Diagnosis present

## 2023-09-09 DIAGNOSIS — N1831 Chronic kidney disease, stage 3a: Secondary | ICD-10-CM | POA: Diagnosis present

## 2023-09-09 DIAGNOSIS — Z66 Do not resuscitate: Secondary | ICD-10-CM | POA: Diagnosis present

## 2023-09-09 DIAGNOSIS — R29704 NIHSS score 4: Secondary | ICD-10-CM | POA: Diagnosis present

## 2023-09-09 DIAGNOSIS — F0394 Unspecified dementia, unspecified severity, with anxiety: Secondary | ICD-10-CM | POA: Diagnosis present

## 2023-09-09 DIAGNOSIS — L89153 Pressure ulcer of sacral region, stage 3: Secondary | ICD-10-CM | POA: Diagnosis present

## 2023-09-09 DIAGNOSIS — I700 Atherosclerosis of aorta: Secondary | ICD-10-CM | POA: Diagnosis present

## 2023-09-09 DIAGNOSIS — I2510 Atherosclerotic heart disease of native coronary artery without angina pectoris: Secondary | ICD-10-CM | POA: Diagnosis present

## 2023-09-09 DIAGNOSIS — F0390 Unspecified dementia without behavioral disturbance: Secondary | ICD-10-CM | POA: Diagnosis not present

## 2023-09-09 DIAGNOSIS — K219 Gastro-esophageal reflux disease without esophagitis: Secondary | ICD-10-CM | POA: Diagnosis present

## 2023-09-09 DIAGNOSIS — L89322 Pressure ulcer of left buttock, stage 2: Secondary | ICD-10-CM | POA: Diagnosis present

## 2023-09-09 DIAGNOSIS — R569 Unspecified convulsions: Secondary | ICD-10-CM

## 2023-09-09 DIAGNOSIS — I6389 Other cerebral infarction: Secondary | ICD-10-CM | POA: Diagnosis not present

## 2023-09-09 DIAGNOSIS — M4802 Spinal stenosis, cervical region: Secondary | ICD-10-CM

## 2023-09-09 DIAGNOSIS — I63521 Cerebral infarction due to unspecified occlusion or stenosis of right anterior cerebral artery: Secondary | ICD-10-CM | POA: Diagnosis present

## 2023-09-09 DIAGNOSIS — E785 Hyperlipidemia, unspecified: Secondary | ICD-10-CM | POA: Diagnosis present

## 2023-09-09 DIAGNOSIS — G8324 Monoplegia of upper limb affecting left nondominant side: Secondary | ICD-10-CM | POA: Diagnosis present

## 2023-09-09 DIAGNOSIS — F32A Depression, unspecified: Secondary | ICD-10-CM | POA: Diagnosis present

## 2023-09-09 DIAGNOSIS — B961 Klebsiella pneumoniae [K. pneumoniae] as the cause of diseases classified elsewhere: Secondary | ICD-10-CM | POA: Diagnosis present

## 2023-09-09 DIAGNOSIS — G6181 Chronic inflammatory demyelinating polyneuritis: Secondary | ICD-10-CM | POA: Diagnosis present

## 2023-09-09 DIAGNOSIS — N390 Urinary tract infection, site not specified: Secondary | ICD-10-CM | POA: Diagnosis present

## 2023-09-09 DIAGNOSIS — R29810 Facial weakness: Secondary | ICD-10-CM | POA: Diagnosis present

## 2023-09-09 DIAGNOSIS — E1122 Type 2 diabetes mellitus with diabetic chronic kidney disease: Secondary | ICD-10-CM | POA: Diagnosis present

## 2023-09-09 DIAGNOSIS — L89312 Pressure ulcer of right buttock, stage 2: Secondary | ICD-10-CM | POA: Diagnosis present

## 2023-09-09 DIAGNOSIS — Z7401 Bed confinement status: Secondary | ICD-10-CM | POA: Diagnosis not present

## 2023-09-09 DIAGNOSIS — I129 Hypertensive chronic kidney disease with stage 1 through stage 4 chronic kidney disease, or unspecified chronic kidney disease: Secondary | ICD-10-CM | POA: Diagnosis present

## 2023-09-09 LAB — CBC
HCT: 35.7 % — ABNORMAL LOW (ref 36.0–46.0)
Hemoglobin: 11.6 g/dL — ABNORMAL LOW (ref 12.0–15.0)
MCH: 29.4 pg (ref 26.0–34.0)
MCHC: 32.5 g/dL (ref 30.0–36.0)
MCV: 90.6 fL (ref 80.0–100.0)
Platelets: 202 10*3/uL (ref 150–400)
RBC: 3.94 MIL/uL (ref 3.87–5.11)
RDW: 14.7 % (ref 11.5–15.5)
WBC: 9.3 10*3/uL (ref 4.0–10.5)
nRBC: 0 % (ref 0.0–0.2)

## 2023-09-09 LAB — BASIC METABOLIC PANEL
Anion gap: 10 (ref 5–15)
BUN: 19 mg/dL (ref 8–23)
CO2: 26 mmol/L (ref 22–32)
Calcium: 9.3 mg/dL (ref 8.9–10.3)
Chloride: 106 mmol/L (ref 98–111)
Creatinine, Ser: 1.46 mg/dL — ABNORMAL HIGH (ref 0.44–1.00)
GFR, Estimated: 37 mL/min — ABNORMAL LOW (ref >60)
Glucose, Bld: 83 mg/dL (ref 70–99)
Potassium: 3.8 mmol/L (ref 3.5–5.1)
Sodium: 142 mmol/L (ref 135–145)

## 2023-09-09 LAB — LIPID PANEL
Cholesterol: 211 mg/dL — ABNORMAL HIGH (ref 0–200)
HDL: 50 mg/dL (ref >40)
LDL Cholesterol: 138 mg/dL — ABNORMAL HIGH (ref 0–99)
Total CHOL/HDL Ratio: 4.2 ratio
Triglycerides: 115 mg/dL (ref <150)
VLDL: 23 mg/dL (ref 0–40)

## 2023-09-09 MED ORDER — ATORVASTATIN CALCIUM 40 MG PO TABS: 40.0000 mg | ORAL_TABLET | Freq: Every day | ORAL | 0 refills | Status: AC

## 2023-09-09 MED ORDER — CLOPIDOGREL BISULFATE 75 MG PO TABS: 75.0000 mg | ORAL_TABLET | Freq: Every day | ORAL | 0 refills | Status: AC

## 2023-09-09 MED ORDER — ATORVASTATIN CALCIUM 20 MG PO TABS
40.0000 mg | ORAL_TABLET | Freq: Every day | ORAL | Status: DC
  Administered 2023-09-09: 40 mg via ORAL
  Filled 2023-09-09: qty 2

## 2023-09-09 NOTE — Procedures (Signed)
 Patient Name: Opal Dinning  MRN: [id number]  Epilepsy Attending: Charlsie Quest  Referring Physician/Provider: Emeline General, MD  Date: 09/09/2023 Duration: 25.55 mins  Patient history: 78yo F with new left-sided weakness. EEG to evaluate for seizure  Level of alertness: Awake, asleep  AEDs during EEG study: None  Technical aspects: This EEG study was done with scalp electrodes positioned according to the 10-20 International system of electrode placement. Electrical activity was reviewed with band pass filter of 1-70Hz , sensitivity of 7 uV/mm, display speed of 63mm/sec with a 60Hz  notched filter applied as appropriate. EEG data were recorded continuously and digitally stored.  Video monitoring was available and reviewed as appropriate.  Description: The posterior dominant rhythm consists of 8 Hz activity of moderate voltage (25-35 uV) seen predominantly in posterior head regions, symmetric and reactive to eye opening and eye closing. Sleep was characterized by vertex waves, sleep spindles (12 to 14 Hz), maximal frontocentral region. Hyperventilation and photic stimulation were not performed.     IMPRESSION: This study is within normal limits. No seizures or epileptiform discharges were seen throughout the recording.  A normal interictal EEG does not exclude the diagnosis of epilepsy.  Hadessah Grennan Annabelle Harman

## 2023-09-09 NOTE — Progress Notes (Cosign Needed)
 Attempted pt for MRI scan.  Patient too kyphotic to lay head back far enough to fit into coil.  Meds given prior.

## 2023-09-09 NOTE — Evaluation (Addendum)
 Physical Therapy Evaluation Patient Details Name: Kaitlin Wagner MRN: [id number] DOB: 08/13/1945 Today's Date: 09/09/2023  History of Present Illness  Pt is a 78 y.o. female presenting to hospital 09/08/23 after positive MRI finding for stroke.  Pt with new/recent L sided weakness.  Pt appears to develop episodes of flaccid weakness of right upper arm alternated with rigidity for the last 5 days per pt's PCP.   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  Pt admitted with subacute stroke with L sided paresis, question of seizure, incidental finding of chronic demyelination process, leukocytosis.  PMH includes DM, dementia, htn, HLD, currently with PACE program.  Per chart pt with contractures and weakness chronically to R side.  Clinical Impression  PT/OT co-evaluation performed.  Prior to recent medical concerns, (per OT) pt was using sit to stand mechanical lift for transfers (family recently trained on use of hoyer lift); lives with her daughters in 1st floor apt (no STE).  Currently pt is max assist x2 with bed mobility and 2 assist with transfers using Corene Cornea.  Pt demonstrating B LE increased tone/rigidity and posterior lean in sitting; pt also demonstrating increased difficulty using L UE compared to R UE for activity.  Pt would currently benefit from skilled PT to address noted impairments and functional limitations (see below for any additional details).  Upon hospital discharge, pt would benefit from ongoing therapy.     If plan is discharge home, recommend the following: Two people to help with walking and/or transfers;Two people to help with bathing/dressing/bathroom;Assistance with cooking/housework;Assistance with feeding;Direct supervision/assist for medications management;Direct supervision/assist for financial management;Assist for transportation;Help with stairs or ramp for entrance   Can travel by private vehicle        Equipment Recommendations Other (comment)  (pt has hoyer and sit to stand mechanical lift at home (and manual w/c that reclines))  Recommendations for Other Services       Functional Status Assessment Patient has had a recent decline in their functional status and demonstrates the ability to make significant improvements in function in a reasonable and predictable amount of time.     Precautions / Restrictions Precautions Precautions: Fall Restrictions Weight Bearing Restrictions Per Provider Order: No      Mobility  Bed Mobility Overal bed mobility: Needs Assistance Bed Mobility: Supine to Sit, Sit to Supine     Supine to sit: Max assist, +2 for physical assistance, HOB elevated Sit to supine: Max assist, +2 for physical assistance, HOB elevated   General bed mobility comments: assist for trunk and B LE's    Transfers Overall transfer level: Needs assistance   Transfers: Bed to chair/wheelchair/BSC             General transfer comment: Ambrose Finland; max assist to lean forward and place UE's on Corene Cornea bar to help pull up on (pt utilized R hand 1st trial stand and B hands 2nd trial standing); min assist x2 to stand up via Corene Cornea (x1 trial from bed and x1 trial from [facility]); bed to/from [facility] transfer via Corene Cornea Transfer via Lift Equipment: Antony Salmon  Ambulation/Gait               General Gait Details: Pt non-ambulatory baseline  Stairs            Wheelchair Mobility     Tilt Bed    Modified Rankin (Stroke Patients Only)       Balance Overall balance assessment: Needs assistance Sitting-balance support: Bilateral  upper extremity supported, Feet supported Sitting balance-Leahy Scale: Poor Sitting balance - Comments: max assist for sitting balance d/t posterior lean   Standing balance support: Reliant on assistive device for balance   Standing balance comment: Ambrose Finland to assist with standing                             Pertinent Vitals/Pain Pain  Assessment Pain Assessment: Faces Faces Pain Scale: No hurt Pain Intervention(s): Limited activity within patient's tolerance, Monitored during session    Home Living Family/patient expects to be discharged to:: Private residence Living Arrangements: Children (Pt's 2 daughters) Available Help at Discharge: Family;Available 24 hours/day Type of Home: Apartment (1st floor) Home Access: Level entry       Home Layout: One level Home Equipment: BSC/3in1;Hospital bed;Other (comment);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) Additional Comments: Sit to stand mechanical transfer lift equipment; family recently trained on hoyer lift; PACE participant (goes 5 days a week); reclined manual w/c; fear of falling in general    Prior Function               Mobility Comments: Pt using sit to stand mechanical transfer lift equipment (pt independent with pulling up until recent symptoms) ADLs Comments: Showers at [facility]     Extremity/Trunk Assessment   Upper Extremity Assessment Upper Extremity Assessment: Defer to OT evaluation    Lower Extremity Assessment Lower Extremity Assessment:  (B LE with increased tone/rigidity; eventually able to flex B knees and hips to 90 degrees in sitting; B ankles positioned in plantarflexion)    Cervical / Trunk Assessment Cervical / Trunk Assessment: Normal  Communication   Communication Communication: No apparent difficulties    Cognition Arousal: Alert Behavior During Therapy: Anxious   PT - Cognitive impairments: History of cognitive impairments                       PT - Cognition Comments: Oriented to person, hospital, and grossly to situation Following commands: Impaired Following commands impaired: Follows one step commands with increased time, Follows one step commands inconsistently     Cueing Cueing Techniques: Verbal cues, Tactile cues, Visual cues     General Comments      Exercises     Assessment/Plan    PT Assessment  Patient needs continued PT services  PT Problem List Decreased strength;Decreased range of motion;Decreased balance;Decreased mobility;Impaired tone       PT Treatment Interventions DME instruction;Functional mobility training;Therapeutic activities;Therapeutic exercise;Balance training;Patient/family education    PT Goals (Current goals can be found in the Care Plan section)  Acute Rehab PT Goals Patient Stated Goal: to improve transfers PT Goal Formulation: With patient Time For Goal Achievement: 09/23/23 Potential to Achieve Goals: Good    Frequency Min 2X/week     Co-evaluation PT/OT/SLP Co-Evaluation/Treatment: Yes Reason for Co-Treatment: For patient/therapist safety;To address functional/ADL transfers PT goals addressed during session: Mobility/safety with mobility;Balance;Proper use of DME OT goals addressed during session: ADL's and self-care       AM-PAC PT "6 Clicks" Mobility  Outcome Measure Help needed turning from your back to your side while in a flat bed without using bedrails?: A Lot Help needed moving from lying on your back to sitting on the side of a flat bed without using bedrails?: Total Help needed moving to and from a bed to a chair (including a wheelchair)?: Total Help needed standing up from a chair using your arms (e.g.,  wheelchair or bedside chair)?: Total Help needed to walk in hospital room?: Total Help needed climbing 3-5 steps with a railing? : Total 6 Click Score: 7    End of Session Equipment Utilized During Treatment: Gait belt Activity Tolerance: Patient tolerated treatment well Patient left: in bed;with call bell/phone within reach;with bed alarm set;with nursing/sitter in room;Other (comment) (pt in chair position (in bed) with nurse present to give medications and to assist pt with eating breakfast) Nurse Communication: Mobility status;Precautions PT Visit Diagnosis: Other abnormalities of gait and mobility (R26.89);Muscle weakness  (generalized) (M62.81);Hemiplegia and hemiparesis Hemiplegia - Right/Left: Left Hemiplegia - caused by: Cerebral infarction    Time: 2536-6440 PT Time Calculation (min) (ACUTE ONLY): 20 min   Charges:   PT Evaluation $PT Eval Low Complexity: 1 Low   PT General Charges $$ ACUTE PT VISIT: 1 Visit        Hendricks Limes, PT 09/09/23, 11:43 AM

## 2023-09-09 NOTE — Progress Notes (Signed)
 Unable to complete 0400 NIH scale, patient was given ativan at 0030 before going to MRI. Patient is is very sleepy.

## 2023-09-09 NOTE — Plan of Care (Signed)
  Problem: Education: Goal: Knowledge of disease or condition will improve Outcome: Not Progressing   Problem: Ischemic Stroke/TIA Tissue Perfusion: Goal: Complications of ischemic stroke/TIA will be minimized 09/09/2023 0504 by Eloisa Northern, RN Outcome: Progressing 09/09/2023 0503 by Eloisa Northern, RN Outcome: Progressing   Problem: Self-Care: Goal: Ability to participate in self-care as condition permits will improve Outcome: Progressing

## 2023-09-09 NOTE — Consult Note (Signed)
 NEUROLOGY CONSULT NOTE   Date of service: September 09, 2023 Patient Name: Kaitlin Wagner MRN:  [id number] DOB:  Sep 12, 1945 Chief Complaint: "Left arm weakness" Requesting Provider: Tresa Moore, MD  History of Present Illness  Elwyn Lowden is a 78 y.o. female with "medical history significant of stroke with right-sided weakness and contracture, advanced dementia, HTN, HLD, IIDM, CKD stage IIIa-b, presented with right ACA stroke.  The patient presents with left-sided weakness which began Tuesday, September 02, 2023. Her daughter notes increased weakness, particularly in the left arm, affecting her ability to lift or support herself. This has led to anxiety about falling and impacted her mobility. Her left leg has been difficult to bend, resulting in a fall and a scraped knee.  She has a history of stroke with previous right-sided weakness beginning last year and chronic microvascular disease, indicating multiple small strokes over time. She takes aspirin regularly and is not currently on any cholesterol medication. Her daughter is unsure if she has ever been on Plavix.  Her dementia complicates her ability to remain still for scans and contributes to confusion, especially in the hospital setting. She sometimes confuses family members at home  She has a history of kidney failure two years ago, which is being monitored. Her medications have been adjusted in the past due to her kidney issues, and she is currently stable at her baseline kidney function.  Daughter thinks her atorvastatin may have been stopped due to renal function, but this is now improved  She lives with her daughter, who has been her primary caregiver for about thirteen years. She requires 24/7 care and uses a stand assist to get in and out of bed. Recently, she has been unable to lift her left hand to the bar of the stand assist. No arm shaking except when nervous.  Daughter does not have any concern for seizures or shaking  episodes  LKW: Tues 4th had issues with the leg  Modified rankin score: 5-Severe disability-bedridden, incontinent, needs constant attention IV Thrombolysis: No, out of the window EVT: No out of the window   NIHSS components Score: Comment  1a Level of Conscious 0[]  1[x]  2[]  3[]      1b LOC Questions 0[]  1[]  2[x]       1c LOC Commands 0[x]  1[]  2[]       2 Best Gaze 0[x]  1[]  2[]       3 Visual 0[x]  1[]  2[]  3[]       4 Facial Palsy 0[]  1[x]  2[]  3[]     left  5a Motor Arm - left 0[]  1[]  2[]  3[x]  4[]  UN[]    5b Motor Arm - Right 0[]  1[]  2[]  3[x]  4[]  UN[]    6a Motor Leg - Left 0[]  1[]  2[]  3[x]  4[]  UN[]    6b Motor Leg - Right 0[]  1[]  2[]  3[x]  4[]  UN[]    7 Limb Ataxia 0[]  1[]  2[]  3[]  UN[x]     8 Sensory 0[]  1[]  2[]  UN[x]      9 Best Language 0[]  1[x]  2[]  3[]      10 Dysarthria 0[]  1[x]  2[]  UN[]      11 Extinct. and Inattention 0[x]  1[]  2[]       TOTAL:       ROS  Unable to assess secondary to patient's mental status, obtained from family as able  Past History   Past Medical History:  Diagnosis Date   Aortic atherosclerosis (HCC) 08/05/2021   Cancer (HCC) colon   Diabetes mellitus without complication (HCC)    GERD (gastroesophageal reflux disease)  Hyperlipidemia    Hypertension     Past Surgical History:  Procedure Laterality Date   CHOLECYSTECTOMY     colon resection     laparoscopic   COLON SURGERY     COLONOSCOPY WITH PROPOFOL N/A 05/12/2015   Procedure: COLONOSCOPY WITH PROPOFOL;  Surgeon: Wallace Cullens, MD;  Location: [facility] ENDOSCOPY;  Service: Gastroenterology;  Laterality: N/A;   COLONOSCOPY WITH PROPOFOL N/A 05/12/2018   Procedure: COLONOSCOPY WITH PROPOFOL;  Surgeon: Toledo, Boykin Nearing, MD;  Location: [facility] ENDOSCOPY;  Service: Gastroenterology;  Laterality: N/A;   ESOPHAGOGASTRODUODENOSCOPY N/A 05/12/2015   Procedure: ESOPHAGOGASTRODUODENOSCOPY (EGD);  Surgeon: Wallace Cullens, MD;  Location: [facility] ENDOSCOPY;  Service: Gastroenterology;  Laterality: N/A;    Family History: History  reviewed. No pertinent family history.  Social History  reports that she has never smoked. She has never used smokeless tobacco. She reports that she does not drink alcohol and does not use drugs.  No Known Allergies  Medications   Current Facility-Administered Medications:     stroke: early stages of recovery book, , Does not apply, Once, Mikey College T, MD   acetaminophen (TYLENOL) tablet 650 mg, 650 mg, Oral, Q4H PRN **OR** acetaminophen (TYLENOL) 160 MG/5ML solution 650 mg, 650 mg, Per Tube, Q4H PRN **OR** acetaminophen (TYLENOL) suppository 650 mg, 650 mg, Rectal, Q4H PRN, Emeline General, MD   amoxicillin-clavulanate (AUGMENTIN) 500-125 MG per tablet 1 tablet, 1 tablet, Oral, Q12H, Mikey College T, MD, 1 tablet at 09/09/23 0935   aspirin chewable tablet 324 mg, 324 mg, Oral, Once, Mumma, Carollee Herter, MD   aspirin EC tablet 81 mg, 81 mg, Oral, Daily, Mikey College T, MD, 81 mg at 09/09/23 0934   clopidogrel (PLAVIX) tablet 75 mg, 75 mg, Oral, Daily, Mikey College T, MD, 75 mg at 09/09/23 0934   escitalopram (LEXAPRO) tablet 5 mg, 5 mg, Oral, Daily, Mikey College T, MD, 5 mg at 09/09/23 0934   heparin injection 5,000 Units, 5,000 Units, Subcutaneous, Q12H, Mikey College T, MD, 5,000 Units at 09/09/23 0935   hydrALAZINE (APRESOLINE) injection 5 mg, 5 mg, Intravenous, Q6H PRN, Mikey College T, MD   LORazepam (ATIVAN) injection 0.5 mg, 0.5 mg, Intravenous, Q6H PRN, Mikey College T, MD, 0.5 mg at 09/09/23 0016   mirtazapine (REMERON) tablet 7.5 mg, 7.5 mg, Oral, QHS, Zhang, Ping T, MD, 7.5 mg at 09/08/23 2111   omeprazole disintegrating tablet 20 mg, 20 mg, Oral, BID AC, Hunt, Madison H, RPH, 20 mg at 09/09/23 0746   senna-docusate (Senokot-S) tablet 1 tablet, 1 tablet, Oral, QHS PRN, Mikey College T, MD   sodium chloride flush (NS) 0.9 % injection 3 mL, 3 mL, Intravenous, Once, Mumma, Shannon, MD   sodium chloride flush (NS) 0.9 % injection 3-10 mL, 3-10 mL, Intravenous, Q12H, Mikey College T, MD, 5 mL at 09/09/23  0935   sodium chloride flush (NS) 0.9 % injection 3-10 mL, 3-10 mL, Intravenous, PRN, Mikey College T, MD  Vitals   Vitals:   09/08/23 2202 09/09/23 0057 09/09/23 0355 09/09/23 0730  BP: (!) 149/73 121/82 (!) 164/72 (!) 151/75  Pulse: 77 76 78 83  Resp: 18 17 17 15   Temp: 99.2 F (37.3 C) 98.4 F (36.9 C) 99.2 F (37.3 C) 98.1 F (36.7 C)  TempSrc: Oral Oral Oral   SpO2: 99% 92% 96% 94%  Weight: 65 kg     Height: 5\' 1"  (1.549 m)       Body mass index is 27.08 kg/m.  Physical Exam  Constitutional: Appears chronically ill  Psych: Affect calm, pleasant, mildly confused   Eyes: No scleral injection.  HENT: No OP obstruction.  Head: Normocephalic. Cardiovascular: Perfusing extremities well  Respiratory: Effort normal, non-labored breathing.  GI: Prominent hernia, no tenderness    Neurologic Examination   Mental status: Drowsy but awakens to voice.  Follows simple commands.  Names simple objects but with some delay.  Repeats simple phrases but struggles with more complex phrases.  Oriented to self not age or month or situation Cranial nerves: Correctly count fingers in all visual fields.  Saccadic pursuits.  EOMI.  Pupils equal round reactive to light.  Mild left facial droop.  Sensation reported symmetric bilaterally but unreliable Motor: Contractures of the right upper extremity as well as weakness in the left upper extremity.  Winces with attempts to move either of her arms.  Left leg appears weaker than the right, neither is antigravity for me Coordination unable to assess due to mental status Sensation unable to reliably assess due to mental status (consistently states "right leg" when I ask her which leg I am touching, for example)  Labs/Imaging/Neurodiagnostic studies   CBC:  Recent Labs  Lab September 14, 2023 1232 09/09/23 0529  WBC 13.6* 9.3  NEUTROABS 11.6*  --   HGB 12.2 11.6*  HCT 38.0 35.7*  MCV 94.1 90.6  PLT 184 202   Basic Metabolic Panel:  Lab Results   Component Value Date   NA 142 09/09/2023   K 3.8 09/09/2023   CO2 26 09/09/2023   GLUCOSE 83 09/09/2023   BUN 19 09/09/2023   CREATININE 1.46 (H) 09/09/2023   CALCIUM 9.3 09/09/2023   GFRNONAA 37 (L) 09/09/2023   Lipid Panel:  Lab Results  Component Value Date   LDLCALC 138 (H) 09/09/2023   HgbA1c:  Lab Results  Component Value Date   HGBA1C 5.4 09/14/23   Urine Drug Screen: No results found for: "LABOPIA", "COCAINSCRNUR", "LABBENZ", "AMPHETMU", "THCU", "LABBARB"  Alcohol Level     Component Value Date/Time   ETH <10 09/14/23 1232   INR  Lab Results  Component Value Date   INR 1.1 September 14, 2023   APTT  Lab Results  Component Value Date   APTT 30 2023-09-14     MRI Brain(Personally reviewed):  1. Positive for Acute to subacute Right ACA territory infarct. No hemorrhagic transformation or mass effect.   2. Underlying severe chronic signal changes in the brain favored to be advanced chronic small vessel disease. With main differential consideration of severe chronic demyelinating disease.   3. 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 Spinal cord mass effect there, but no cord signal abnormality is evident.  Neurodiagnostics rEEG:  This study is within normal limits. No seizures or epileptiform discharges were seen throughout the recording. A normal interictal EEG does not exclude the diagnosis of epilepsy.  ASSESSMENT   Arika Mainer is a 78 y.o. female presenting with a right ACA stroke and concern for possible seizures from PCP.    On further history from daughter seizures do not seem to be likely based on clinical description, although she is certainly at risk given her dementia.  EEG is reassuring, for now would not start antiseizure medications  Regarding her stroke, workup is limited secondary to her dementia and comorbidities.  CTA head and neck is contraindicated due to her renal function  and will not tolerate MRA head and neck due to inability to lay still well for the exam.  Will  obtain carotid ultrasound more from a diagnostic perspective to test for potential limb shaking TIAs as an etiology for her symptoms and establish a baseline of her carotid vasculature  For secondary prevention daughter is amenable to adding Plavix and restarting cholesterol medication  RECOMMENDATIONS  # Right ACA territory stroke, embolic appearing, limited ability to fully evaluate etiology due to patient's advanced dementia - HgbA1c meeting goal, fasting lipid panel LDL 138 (> goal < 70) - Atorvastatin 40 mg nightly  - Carotid ultrasound result pending; this can be followed up on an outpatient basis as she is not a good surgical candidate - Echocardiogram pending, this could be completed on an outpatient basis with follow-up with cardiology if preferred - EEG pending due to episodic arm shaking, negative  - Continue home aspirin 81 mg daily - Plavix 75 mg daily for a 21-day course - Risk factor modification - Telemetry monitoring; 30 day event monitor on an outpatient basis  - Blood pressure goal   - Normotension - PT consult, OT consult, Speech consult, if patient willing to participate (has declined PT in past hospitalizations)   # C3-C4 spinal cord stenosis - Outpatient follow-up, discussed with daughter that this may also be contributing to her mom's overall decline and progressive weakness but she is unlikely to be a good surgical candidate due to her comorbidities and baseline functional status  Discussed with primary team via secure chat, inpatient neurology will sign off at this time but please do not hesitate to reach out if additional questions or concerns arise ______________________________________________________________________  Brooke Dare MD-PhD [facility] [phone]

## 2023-09-09 NOTE — Progress Notes (Signed)
 Patient off unit. Unable to do 1200 NIH/ neuro assessment. Daughter was in at around 1100, was updated on condition by RN and MD as well as what procedures she was off unit for.

## 2023-09-09 NOTE — Progress Notes (Signed)
 Eeg done

## 2023-09-09 NOTE — Progress Notes (Addendum)
 SLP Cancellation Note  Patient Details Name: Kaitlin Wagner MRN: [id number] DOB: [birth year]   Cancelled treatment:       Reason Eval/Treat Not Completed:  (chart reviewed; consulted NSG staff while in room w/ pt.)  Pt admitted w/ concern for stroke/stroke-like symptoms of left sided paresis. Pt has a baseline dx of Advanced Dementia; and per MD note at admit: "Mentation at baseline as per family; denies any speech problems no cough or choking after eating or drink.". Pt has 24/7 supervision by family per chart. The Plan for dc tomorrow back home with support from her PACE Program.  In the room, pt verbally engaged w/ NSG staff and this SLP to indicated likes/dislikes and followed 1-step commands during cares. Speech was rushed and slightly mumbled/muttered but intelligible. Pt is Missing Dentition; noted expectorated, chewed meat on side of her Lunch tray.   Recommend slight modification of her meats/foods at meals for ease of chewing and made adjustments to the diet order. Recommend general aspiration precautions and support at meals for setup. Recommend f/u post discharge w/ ST services IF any decline/change from her usual baseline is noted during her level of ADLs in the home(in light of Dementia). She is followed by [employer]. MD updated.     Jerilynn Som, MS, CCC-SLP Speech Language Pathologist Rehab Services; [facility] [phone] (ascom) Zofia Peckinpaugh 09/09/2023, 4:40 PM

## 2023-09-09 NOTE — Evaluation (Signed)
 Occupational Therapy Evaluation Patient Details Name: Kaitlin Wagner MRN: [id number] DOB: February 15, 1946 Today's Date: 09/09/2023   History of Present Illness   Pt is a 78 y.o. female presenting to hospital 09/08/23 after positive MRI finding for stroke.  Pt with new/recent L sided weakness.  Pt appears to develop episodes of flaccid weakness of right upper arm alternated with rigidity for the last 5 days per pt's PCP.   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  Pt admitted with subacute stroke with L sided paresis, question of seizure, incidental finding of chronic demyelination process, leukocytosis.  PMH includes DM, dementia, htn, HLD, currently with [facility].  Per chart pt with contractures and weakness chronically to R side.     Clinical Impressions PT/OT co-evaluation performed.  Prior to recent medical concerns, (per OT at [facility]) pt was using sit to stand mechanical lift for transfers (family recently trained on use of hoyer lift and have available at home if needed); lives with her daughters in 1st floor apt (no STE).  Currently pt is max assist x2 with bed mobility and 2 assist with transfers using Corene Cornea.  Pt demonstrating B UE and B LE increased tone/rigidity and posterior lean in sitting; pt also demonstrating increased difficulty using L UE compared to R UE for activity.  Pt would currently benefit from additional skilled OT to address noted impairments and functional limitations (see below for any additional details).  Upon hospital discharge, pt would benefit from ongoing therapy.     If plan is discharge home, recommend the following:   Two people to help with walking and/or transfers;A lot of help with bathing/dressing/bathroom;Direct supervision/assist for medications management;Supervision due to cognitive status;Direct supervision/assist for financial management;Assist for transportation;Assistance with cooking/housework;Assistance with feeding;Help with  stairs or ramp for entrance     Functional Status Assessment   Patient has had a recent decline in their functional status and demonstrates the ability to make significant improvements in function in a reasonable and predictable amount of time.     Equipment Recommendations   None recommended by OT (pt has needed equipment - confirmed with [facility] OT)     Recommendations for Other Services         Precautions/Restrictions   Precautions Precautions: Fall Restrictions Weight Bearing Restrictions Per Provider Order: No     Mobility Bed Mobility Overal bed mobility: Needs Assistance Bed Mobility: Supine to Sit, Sit to Supine     Supine to sit: Max assist, +2 for physical assistance, HOB elevated Sit to supine: Max assist, +2 for physical assistance, HOB elevated   General bed mobility comments: assist for trunk and B LE's    Transfers Overall transfer level: Needs assistance   Transfers: Bed to chair/wheelchair/BSC             General transfer comment: Ambrose [place]; max assist to lean forward and place UE's on Corene Cornea bar to help pull up on (pt utilized R hand 1st trial stand and B hands 2nd trial standing); min assist x2 to stand up via Corene Cornea (x1 trial from bed and x1 trial from [facility]); bed to/from [facility] transfer via Corene Cornea Transfer via Lift Equipment: Stedy    Balance Overall balance assessment: Needs assistance Sitting-balance support: Bilateral upper extremity supported, Feet supported Sitting balance-Leahy Scale: Poor Sitting balance - Comments: max assist for sitting balance d/t posterior lean   Standing balance support: Reliant on assistive device for balance   Standing balance comment: Peter [place]  Stedy to assist with standing                           ADL either performed or assessed with clinical judgement   ADL Overall ADL's : Needs assistance/impaired Eating/Feeding: Maximal assistance;Bed level   Grooming: Bed  level;Maximal assistance   Upper Body Bathing: Bed level;Maximal assistance   Lower Body Bathing: Bed level;Maximal assistance   Upper Body Dressing : Sitting;Maximal assistance   Lower Body Dressing: Bed level;Maximal assistance   Toilet Transfer: +2 for physical assistance;Cueing for safety;BSC/3in1 Toilet Transfer Details (indicate cue type and reason): using stedy lift for transfer Toileting- Clothing Manipulation and Hygiene: Sit to/from stand;Maximal assistance Toileting - Clothing Manipulation Details (indicate cue type and reason): stedy lift             Vision         Perception         Praxis         Pertinent Vitals/Pain Pain Assessment Pain Assessment: Faces Faces Pain Scale: No hurt Pain Intervention(s): Monitored during session, Limited activity within patient's tolerance     Extremity/Trunk Assessment Upper Extremity Assessment Upper Extremity Assessment:  (B UE with increased tone/rigidity, L>R)   Lower Extremity Assessment Lower Extremity Assessment: Defer to PT evaluation   Cervical / Trunk Assessment Cervical / Trunk Assessment: Normal   Communication Communication Communication: No apparent difficulties   Cognition Arousal: Alert Behavior During Therapy: Anxious Cognition: No family/caregiver present to determine baseline                               Following commands: Impaired Following commands impaired: Follows one step commands with increased time, Follows one step commands inconsistently     Cueing  General Comments   Cueing Techniques: Verbal cues;Tactile cues;Visual cues      Exercises     Shoulder Instructions      Home Living Family/patient expects to be discharged to:: Private residence Living Arrangements: Children (Pt's 2 daughters) Available Help at Discharge: Family;Available 24 hours/day Type of Home: Apartment (1st floor) Home Access: Level entry     Home Layout: One level                Home Equipment: BSC/3in1;Hospital bed;Other (comment);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)   Additional Comments: Sit to stand mechanical transfer lift equipment; family recently trained on hoyer lift and provided with lift for home use if needed; [facility] participant (goes 5 days a week); reclined manual w/c; fear of falling in general      Prior Functioning/Environment Prior Level of Function : Needs assist             Mobility Comments: Pt using sit to stand mechanical transfer lift equipment (pt independent with pulling up until recent symptoms) ADLs Comments: Showers at [employer]    OT Problem List: Impaired tone;Impaired balance (sitting and/or standing);Decreased knowledge of use of DME or AE;Decreased safety awareness;Decreased activity tolerance   OT Treatment/Interventions: Self-care/ADL training;Therapeutic exercise;Therapeutic activities;Neuromuscular education;DME and/or AE instruction;Patient/family education;Balance training;Manual therapy      OT Goals(Current goals can be found in the care plan section)   Acute Rehab OT Goals Patient Stated Goal: go home OT Goal Formulation: With patient Time For Goal Achievement: 09/23/23 Potential to Achieve Goals: Good ADL Goals Pt Will Perform Lower Body Dressing: sit to/from stand;with caregiver independent in assisting Pt Will Transfer to Toilet: with min  assist;with +2 assist (caregivers indep in assisting, lift equipment as needed) Pt Will Perform Toileting - Clothing Manipulation and hygiene: with caregiver independent in assisting;sit to/from stand;with max assist (using lift)   OT Frequency:  Min 1X/week    Co-evaluation PT/OT/SLP Co-Evaluation/Treatment: Yes Reason for Co-Treatment: For patient/therapist safety;To address functional/ADL transfers PT goals addressed during session: Mobility/safety with mobility;Balance;Proper use of DME OT goals addressed during session: ADL's and self-care      AM-PAC OT "6  Clicks" Daily Activity     Outcome Measure Help from another person eating meals?: A Lot Help from another person taking care of personal grooming?: A Lot Help from another person toileting, which includes using toliet, bedpan, or urinal?: Total Help from another person bathing (including washing, rinsing, drying)?: A Lot Help from another person to put on and taking off regular upper body clothing?: A Lot Help from another person to put on and taking off regular lower body clothing?: A Lot 6 Click Score: 11   End of Session Equipment Utilized During Treatment: Gait belt Nurse Communication: Mobility status  Activity Tolerance: Patient tolerated treatment well Patient left: in bed;with call bell/phone within reach;with bed alarm set;with nursing/sitter in room  OT Visit Diagnosis: Other abnormalities of gait and mobility (R26.89)                Time: 1610-9604 OT Time Calculation (min): 21 min Charges:  OT General Charges $OT Visit: 1 Visit OT Evaluation $OT Eval Moderate Complexity: 1 Mod  Arman Filter., MPH, MS, OTR/L ascom [phone] 09/09/23, 2:34 PM

## 2023-09-09 NOTE — Plan of Care (Signed)
  Problem: Education: Goal: Knowledge of disease or condition will improve Outcome: Not Progressing   Problem: Ischemic Stroke/TIA Tissue Perfusion: Goal: Complications of ischemic stroke/TIA will be minimized Outcome: Progressing   Problem: Self-Care: Goal: Ability to participate in self-care as condition permits will improve Outcome: Progressing

## 2023-09-09 NOTE — Progress Notes (Signed)
 PROGRESS NOTE    Kaitlin Wagner  ZOX:[id number] DOB: September 08, 1945 DOA: 09/08/2023 PCP: Gwendlyn Deutscher, MD    Brief Narrative:  78 y.o. female with medical history significant of stroke with right-sided weakness and contracture, advanced dementia, HTN, HLD, IIDM, CKD stage IIIa-b, presented with strokelike symptoms.   Patient has a chronic right-sided weakness after previous stroke however 7 days ago family noticed that the patient developed and new left-sided weakness and a outpatient MRI was ordered and today the MRI resulted showing right ACA territory stroke subacute.  Denies any speech problems no cough or choking after eating or drink.  Mentation at baseline as per family. Her PCP Dr. Vertell Novak [phone]) told me over the phone that patient appears to develop episodes of flaccid weakness of right upper arm alternated with rigidity for the last 5 days and PCP wonders whether patient had seizure triggered by stroke.  Patient was diagnosed with Klebsiella UTI 2 days ago and nursing home.   Assessment & Plan:   Principal Problem:   Stroke [facility]) Active Problems:   Stroke (cerebrum) (HCC)   Acute CVA (cerebrovascular accident) (HCC)   Acute vs Subacute stroke Left-sided paresis Patient already on ASA.  Not a candidate for neurointervention Out of window for permissive htn EEG negative Plan: DAPT ASA and plavix 21 days, then resume ASA monotherapy PT OT SLP Neurology consulted Patient essentially medically ready for dc Will plan to dc home in care of [facility] 3/12 Will order [facility] PT and OT  Question of seizure -Her PCP observed episodes of flaccid weakness of right upper arm alternated with rigidity for the last 5 days and PCP wonders whether patient had seizure triggered by stroke and requested that a EEG to be done. - EEG negative   Incidental finding of chronic demyelination process -Probably related to dementia, do not expect further workup as patient has advanced dementia  and in hospice care.   Leukocytosis Recently diagnosed Klebsiella UTI -PCP reported that the patient was diagnosed with Klebsiella UTI and has been on Augmentin 2 days ago, we will continue Augmentin 875 mg twice daily   HTN -As above   CKD stage IIIa-B -Euvolemic, creatinine level stable, no acute concerns   Anxiety/depression -Continue Remeron and Lexapro   CODE STATUS -Confirmed with family and PCP patient is DNR/DNI.  Patient however is not currently on hospice care.  [facility] reception desk [phone] in case more information needed.   DVT prophylaxis: SQ hep Code Status: DNR Family Communication:Daughter bedside 3/11 Disposition Plan: Status is: Inpatient Remains inpatient appropriate because: Unsafe discharge plan.  Plan for dc tomorrow back home with [facility].  HH PT and OT ordered   Level of care: Telemetry Medical  Consultants:  Neurology  Procedures:  None  Antimicrobials: Augmentin (POA)    Subjective: Seen and examined.  No acute distress.  Lying in bed, appears lethargic  Objective: Vitals:   09/08/23 2202 09/09/23 0057 09/09/23 0355 09/09/23 0730  BP: (!) 149/73 121/82 (!) 164/72 (!) 151/75  Pulse: 77 76 78 83  Resp: 18 17 17 15   Temp: 99.2 F (37.3 C) 98.4 F (36.9 C) 99.2 F (37.3 C) 98.1 F (36.7 C)  TempSrc: Oral Oral Oral   SpO2: 99% 92% 96% 94%  Weight: 65 kg     Height: 5\' 1"  (1.549 m)       Intake/Output Summary (Last 24 hours) at 09/09/2023 1610 Last data filed at 09/09/2023 0900 Gross per 24 hour  Intake 120 ml  Output --  Net 120 ml   Filed Weights   09/08/23 2202  Weight: 65 kg    Examination:  General exam: NAD, appears frail Respiratory system: Clear to auscultation. Respiratory effort normal. Cardiovascular system: S1S2, RRR, no murmur Gastrointestinal system: Soft, NTND, +BS Central nervous system: Alert, oriented to self.  Left facial droop, right side weakness Extremities: Decreased right side power Skin:  No rashes, lesions or ulcers Psychiatry: Judgement and insight appear impaired. Mood & affect flattened.     Data Reviewed: I have personally reviewed following labs and imaging studies  CBC: Recent Labs  Lab 09/08/23 1232 09/09/23 0529  WBC 13.6* 9.3  NEUTROABS 11.6*  --   HGB 12.2 11.6*  HCT 38.0 35.7*  MCV 94.1 90.6  PLT 184 202   Basic Metabolic Panel: Recent Labs  Lab 09/08/23 1232 09/09/23 0529  NA 143 142  K 3.7 3.8  CL 105 106  CO2 26 26  GLUCOSE 108* 83  BUN 19 19  CREATININE 1.46* 1.46*  CALCIUM 9.4 9.3   GFR: Estimated Creatinine Clearance: 27.4 mL/min (A) (by C-G formula based on SCr of 1.46 mg/dL (H)). Liver Function Tests: Recent Labs  Lab 09/08/23 1232  AST 29  ALT 17  ALKPHOS 97  BILITOT 1.4*  PROT 7.8  ALBUMIN 3.9   No results for input(s): "LIPASE", "AMYLASE" in the last 168 hours. No results for input(s): "AMMONIA" in the last 168 hours. Coagulation Profile: Recent Labs  Lab 09/08/23 1232  INR 1.1   Cardiac Enzymes: No results for input(s): "CKTOTAL", "CKMB", "CKMBINDEX", "TROPONINI" in the last 168 hours. BNP (last 3 results) No results for input(s): "PROBNP" in the last 8760 hours. HbA1C: Recent Labs    09/08/23 1645  HGBA1C 5.4   CBG: No results for input(s): "GLUCAP" in the last 168 hours. Lipid Profile: Recent Labs    09/09/23 0529  CHOL 211*  HDL 50  LDLCALC 138*  TRIG 115  CHOLHDL 4.2   Thyroid Function Tests: No results for input(s): "TSH", "T4TOTAL", "FREET4", "T3FREE", "THYROIDAB" in the last 72 hours. Anemia Panel: No results for input(s): "VITAMINB12", "FOLATE", "FERRITIN", "TIBC", "IRON", "RETICCTPCT" in the last 72 hours. Sepsis Labs: No results for input(s): "PROCALCITON", "LATICACIDVEN" in the last 168 hours.  No results found for this or any previous visit (from the past 240 hours).       Radiology Studies: EEG adult Result Date: 09/09/2023 Charlsie Quest, MD     09/09/2023 12:07 PM  Patient Name: Kaitlin Wagner MRN: [id number] Epilepsy Attending: Charlsie Quest Referring Physician/Provider: Emeline General, MD Date: 09/09/2023 Duration: 25.55 mins Patient history: 78yo F with new left-sided weakness. EEG to evaluate for seizure Level of alertness: Awake, asleep AEDs during EEG study: None Technical aspects: This EEG study was done with scalp electrodes positioned according to the 10-20 International system of electrode placement. Electrical activity was reviewed with band pass filter of 1-70Hz , sensitivity of 7 uV/mm, display speed of 23mm/sec with a 60Hz  notched filter applied as appropriate. EEG data were recorded continuously and digitally stored.  Video monitoring was available and reviewed as appropriate. Description: The posterior dominant rhythm consists of 8 Hz activity of moderate voltage (25-35 uV) seen predominantly in posterior head regions, symmetric and reactive to eye opening and eye closing. Sleep was characterized by vertex waves, sleep spindles (12 to 14 Hz), maximal frontocentral region. Hyperventilation and photic stimulation were not performed.   IMPRESSION: This study is within normal limits. No seizures or  epileptiform discharges were seen throughout the recording. A normal interictal EEG does not exclude the diagnosis of epilepsy. Charlsie Quest   DG Chest 1 View Result Date: 09/08/2023 CLINICAL DATA:  Leukocytosis. EXAM: CHEST  1 VIEW COMPARISON:  Chest radiograph dated 11/04/2022. FINDINGS: Mild cardiomegaly with mild central vascular congestion. Faint left lower lung field density may represent atelectasis. Pneumonia is less likely but not excluded. No pleural effusion or pneumothorax. Atherosclerotic calcification of the aorta. No acute osseous pathology. IMPRESSION: 1. Mild cardiomegaly with mild central vascular congestion. 2. Left lower lung field atelectasis versus less likely pneumonia. Electronically Signed   By: Elgie Collard M.D.   On: 09/08/2023 20:23    MR BRAIN WO CONTRAST Result Date: 09/08/2023 CLINICAL DATA:  78 year old female referred for increasing weakness late last week. Reportedly is wheelchair bound with dementia, spasticity. EXAM: MRI HEAD WITHOUT CONTRAST TECHNIQUE: Multiplanar, multiecho pulse sequences of the brain and surrounding structures were obtained without intravenous contrast. CONTRAST:  A study without and with contrast was planned, but despite multiple attempts no IV access could be obtained and no contrast was administered. COMPARISON:  No prior brain imaging. FINDINGS: Brain: Patchy and confluent restricted diffusion throughout the medial right hemisphere corresponding to the right ACA vascular territory (series 15, image 42). Some direct involvement of the corpus callosum. No evidence of hemorrhagic transformation or mass effect. No other areas of diffusion restriction. But chronic severe T2 and FLAIR signal abnormality elsewhere in both hemispheres which most resembles advanced chronic ischemic disease. This includes numerous small areas of cystic encephalomalacia favored to be chronic lacunar infarcts in the bilateral deep gray nuclei, deep white matter capsules. The main differential consideration is severe chronic demyelinating disease. But on SWI there is comparatively mild chronic hemosiderin in those areas. Possible small chronic microhemorrhage also in the left deep cerebellar nuclei. Mild to moderate patchy T2 heterogeneity in the pons. No definite cortical encephalomalacia. No midline shift, mass effect, evidence of mass lesion, ventriculomegaly, extra-axial collection or acute intracranial hemorrhage. Cervicomedullary junction and pituitary are within normal limits. Vascular: Major intracranial vascular flow voids are preserved. Skull and upper cervical spine: Normal background bone marrow signal. 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 No obvious cord signal abnormality. Sinuses/Orbits: Negative.  Paranasal sinuses are well aerated. Other: Mastoids are well aerated.  Negative visible scalp and face. IMPRESSION: 1. Positive for Acute to subacute Right ACA territory infarct. No hemorrhagic transformation or mass effect. 2. Underlying severe chronic signal changes in the brain favored to be advanced chronic small vessel disease. With main differential consideration of severe chronic demyelinating disease. 3. 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 Spinal cord mass effect there, but no cord signal abnormality is evident. Study discussed by telephone with [name] PA on 09/08/2023 at 1153 hours. Electronically Signed   By: Odessa Fleming M.D.   On: 09/08/2023 12:10        Scheduled Meds:   stroke: early stages of recovery book   Does not apply Once   amoxicillin-clavulanate  1 tablet Oral Q12H   aspirin  324 mg Oral Once   aspirin EC  81 mg Oral Daily   atorvastatin  40 mg Oral QHS   clopidogrel  75 mg Oral Daily   escitalopram  5 mg Oral Daily   heparin  5,000 Units Subcutaneous Q12H   mirtazapine  7.5 mg Oral QHS   omeprazole  20 mg Oral BID AC   sodium chloride flush  3 mL Intravenous Once   sodium chloride flush  3-10 mL Intravenous Q12H   Continuous Infusions:   LOS: 0 days    Tresa Moore, MD [facility]   If 7PM-7AM, please contact night-coverage  09/09/2023, 4:10 PM

## 2023-09-10 DIAGNOSIS — I639 Cerebral infarction, unspecified: Secondary | ICD-10-CM | POA: Diagnosis not present

## 2023-09-10 LAB — ECHOCARDIOGRAM COMPLETE
AR max vel: 2.19 cm2
AV Area VTI: 2.13 cm2
AV Area mean vel: 2.41 cm2
AV Mean grad: 4.1 mmHg
AV Peak grad: 7.6 mmHg
Ao pk vel: 1.38 m/s
Area-P 1/2: 3.48 cm2
Height: 61 in
S' Lateral: 2.7 cm
Weight: 2292.78 [oz_av]

## 2023-09-10 NOTE — Plan of Care (Signed)
   Problem: Education: Goal: Knowledge of General Education information will improve Description Including pain rating scale, medication(s)/side effects and non-pharmacologic comfort measures Outcome: Progressing

## 2023-09-10 NOTE — TOC Transition Note (Signed)
 Transition of Care [facility]) - Discharge Note   Patient Details  Name: Kaitlin Wagner MRN: [id number] Date of Birth: [birth year]  [facility] CM/SW Contact:  Garret Reddish, RN Phone Number: 09/10/2023, 10:59 AM   Clinical Narrative:    Chart reviewed.  Noted that patient has orders for discharge today.    I have spoken with Eileen Stanford, [facility] for [facility].  She informs me that patient is active with [facility] program.  I have informed Eileen Stanford that PT/OT has recommended OT/PT for patient on discharge.  Eileen Stanford  informs me that patient will be able to receive PT/OT at the [facility] program daily.  Eileen Stanford informs me that patient has in home aids to assist her with bathing and dressing.  Eileen Stanford reports that [facility] will transport patient to [facility] program today and then transport patient home today.    I have spoken to patient's daughter. Mrs. [name].  She informs confirms that her mother does go to [facility] program.  She reports that Mrs. Cadogan receives baths at the  [employer].  I have informed Mrs. [name] that [facility] will provide PT/OT services on discharge.  I have also made Mrs. Stout aware that [facility] will transport patient to the facility today.   I have informed staff nurse of the above information.     Final next level of care: Home w Home Health Services ([facility] Program following) Barriers to Discharge: No Barriers Identified   Patient Goals and CMS Choice   CMS Medicare.gov Compare Post Acute Care list provided to:: Patient Choice offered to / list presented to : Patient      Discharge Placement                  Name of family member notified: LVM for patient's daughter  Maceo Pro) Patient and family notified of of transfer: 09/10/23  Discharge Plan and Services Additional resources added to the After Visit Summary for                            [facility] Arranged: PT, OT (Arranged via [facility] program)          Social Drivers of Health (SDOH) Interventions SDOH Screenings    Food Insecurity: No Food Insecurity (09/08/2023)  Housing: Low Risk  (09/08/2023)  Transportation Needs: No Transportation Needs (09/08/2023)  Utilities: Not At Risk (09/08/2023)  Social Connections: Socially Isolated (09/08/2023)  Tobacco Use: Low Risk  (09/08/2023)     Readmission Risk Interventions     No data to display

## 2023-09-10 NOTE — Plan of Care (Signed)
  Problem: Education: Goal: Knowledge of disease or condition will improve Outcome: Progressing Goal: Knowledge of secondary prevention will improve (MUST DOCUMENT ALL) Outcome: Progressing Goal: Knowledge of patient specific risk factors will improve (DELETE if not current risk factor) Outcome: Progressing   Problem: Ischemic Stroke/TIA Tissue Perfusion: Goal: Complications of ischemic stroke/TIA will be minimized Outcome: Progressing   Problem: Coping: Goal: Will verbalize positive feelings about self Outcome: Progressing Goal: Will identify appropriate support needs Outcome: Progressing   Problem: Health Behavior/Discharge Planning: Goal: Ability to manage health-related needs will improve Outcome: Progressing Goal: Goals will be collaboratively established with patient/family Outcome: Progressing   Problem: Self-Care: Goal: Ability to participate in self-care as condition permits will improve Outcome: Progressing Goal: Verbalization of feelings and concerns over difficulty with self-care will improve Outcome: Progressing Goal: Ability to communicate needs accurately will improve Outcome: Progressing   Problem: Nutrition: Goal: Risk of aspiration will decrease Outcome: Progressing Goal: Dietary intake will improve Outcome: Progressing   Problem: Education: Goal: Knowledge of General Education information will improve Description: Including pain rating scale, medication(s)/side effects and non-pharmacologic comfort measures Outcome: Progressing

## 2023-09-10 NOTE — Plan of Care (Signed)
  Problem: Ischemic Stroke/TIA Tissue Perfusion: Goal: Complications of ischemic stroke/TIA will be minimized Outcome: Progressing   Problem: Education: Goal: Knowledge of disease or condition will improve Outcome: Progressing   Problem: Health Behavior/Discharge Planning: Goal: Goals will be collaboratively established with patient/family Outcome: Progressing   Problem: Self-Care: Goal: Ability to participate in self-care as condition permits will improve Outcome: Progressing Goal: Ability to communicate needs accurately will improve Outcome: Progressing   Problem: Nutrition: Goal: Risk of aspiration will decrease Outcome: Progressing Goal: Dietary intake will improve Outcome: Progressing   Problem: Education: Goal: Knowledge of General Education information will improve Description: Including pain rating scale, medication(s)/side effects and non-pharmacologic comfort measures Outcome: Progressing

## 2023-09-10 NOTE — Discharge Summary (Signed)
 Physician Discharge Summary   Patient: Kaitlin Wagner MRN: [id number] DOB: 07-08-1945  Admit date:     09/08/2023  Discharge date: 09/10/23  Discharge Physician: Jailey Booton   PCP: Gwendlyn Deutscher, MD   Recommendations at discharge:   Take medications as recommended   Discharge Diagnoses: Principal Problem:   Acute CVA (cerebrovascular accident) (HCC) Active Problems:   Diabetes mellitus type 2, uncomplicated (HCC)   Advanced dementia (HCC)   GERD (gastroesophageal reflux disease)   Essential (primary) hypertension  Resolved Problems:   * No resolved hospital problems. [facility] Course: Kaitlin Wagner is a 78 y.o. female with medical history significant of stroke with right-sided weakness and contracture, advanced dementia, HTN, HLD, IIDM, CKD stage IIIa-b, presented with strokelike symptoms.   Patient has a chronic right-sided weakness after previous stroke however 7 days ago family noticed that the patient developed and new left-sided weakness and a outpatient MRI was ordered and today the MRI resulted showing right ACA territory stroke subacute.  Denies any speech problems no cough or choking after eating or drink.  Mentation at baseline as per family. Her PCP Dr. Vertell Novak [phone]) told me over the phone that patient appears to develop episodes of flaccid weakness of right upper arm alternated with rigidity for the last 5 days and PCP wonders whether patient had seizure triggered by stroke.  Patient was diagnosed with Klebsiella UTI 2 days ago and nursing home.   ED Course: Afebrile, nontachycardic blood pressure slightly elevated SBP 140-160.  Blood work showed hemoglobin 13.6, creatinine 1.4 about her baseline BUN 19.     Assessment and Plan:  Principal Problem:   Stroke [facility]) Active Problems:   Stroke (cerebrum) (HCC)   Acute CVA (cerebrovascular accident) (HCC)    Acute vs Subacute stroke Left-sided paresis Patient already on ASA.  Not a candidate  for neurointervention Out of window for permissive htn EEG negative Plan: DAPT ASA and plavix 21 days, then resume ASA monotherapy PT OT SLP Neurology consulted Patient essentially medically ready for dc Will plan to dc home in care of [facility] 3/12 [facility] to provide  [facility] PT and OT   Question of seizure -Her PCP observed episodes of flaccid weakness of right upper arm alternated with rigidity for the last 5 days and PCP wonders whether patient had seizure triggered by stroke and requested that a EEG to be done. - EEG negative    Incidental finding of chronic demyelination process -Probably related to dementia, do not expect further workup as patient has advanced dementia and in hospice care.    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 Continue telmisartan Renal function is stable      Anxiety/depression -Continue Remeron and buspirone    Pressure injury Sacrum posterior, mid stage III Buttocks, mid stage II Ankle posterior right, stage I  Patient is seen and examined at the bedside and is  stable condition for discharge         Consultants: Neurology Procedures performed: EEG Disposition: Home health Diet recommendation:  Discharge Diet Orders (From admission, onward)     Start     Ordered   09/10/23 0000  Diet - low sodium heart healthy        09/10/23 1027  Cardiac diet DISCHARGE MEDICATION: Allergies as of 09/10/2023   No Known Allergies      Medication List     TAKE these medications    Acetaminophen Childrens 160 MG/5ML Soln Take 20 mLs by mouth 3 (three) times daily as needed for pain.   amoxicillin-clavulanate 875-125 MG tablet Commonly known as: AUGMENTIN Take 1 tablet by mouth 2 (two) times daily.    Anti-Itch lotion Generic drug: camphor-menthol Apply 1 Application topically as needed.   aspirin EC 81 MG tablet Take 81 mg by mouth daily.   atorvastatin 40 MG tablet Commonly known as: LIPITOR Take 1 tablet (40 mg total) by mouth at bedtime.   busPIRone 5 MG tablet Commonly known as: BUSPAR Take 5 mg by mouth 2 (two) times daily.   clopidogrel 75 MG tablet Commonly known as: PLAVIX Take 1 tablet (75 mg total) by mouth daily for 21 days.   escitalopram 10 MG tablet Commonly known as: LEXAPRO Take 10 mg by mouth daily.   esomeprazole 10 MG packet Commonly known as: NEXIUM Take 10 mg by mouth 2 (two) times daily.   mirtazapine 7.5 MG tablet Commonly known as: REMERON Take 1 tablet (7.5 mg total) by mouth at bedtime.   PrePLUS 27-1 MG Tabs Take 1 tablet by mouth daily.   telmisartan 20 MG tablet Commonly known as: MICARDIS Take 20 mg by mouth daily.   tiZANidine 2 MG tablet Commonly known as: ZANAFLEX Take 2 mg by mouth at bedtime.        Follow-up Information     Mouw, Elnita Maxwell, MD Follow up.   Specialty: Family Medicine Why: Hospital follow up Contact information: [address] [place] [address] [phone]                Discharge Exam: Ceasar Mons Weights   09/08/23 2202  Weight: 65 kg   General exam: NAD, appears frail Respiratory system: Clear to auscultation. Respiratory effort normal. Cardiovascular system: S1S2, RRR, no murmur Gastrointestinal system: Soft, NTND, +BS Central nervous system: Alert, oriented to self.  Left facial droop, right side weakness Extremities: Decreased right side power Skin: No rashes, lesions or ulcers Psychiatry:. Mood & affect flattened.     Condition at discharge: stable  The results of significant diagnostics from this hospitalization (including imaging, microbiology, ancillary and laboratory) are listed below for reference.   Imaging Studies: US Carotid Bilateral Result Date:  09/10/2023 CLINICAL DATA:  Acute cerebral infarction. History of hypertension, hyperlipidemia and diabetes. EXAM: BILATERAL CAROTID DUPLEX ULTRASOUND TECHNIQUE: Wallace Cullens scale imaging, color Doppler and duplex ultrasound were performed of bilateral carotid and vertebral arteries in the neck. COMPARISON:  None Available. FINDINGS: Criteria: Quantification of carotid stenosis is based on velocity parameters that correlate the residual internal carotid diameter with NASCET-based stenosis levels, using the diameter of the distal internal carotid lumen as the denominator for stenosis measurement. The following velocity measurements were obtained: RIGHT ICA:  100/13 cm/sec CCA:  147/12 cm/sec SYSTOLIC ICA/CCA RATIO:  0.6 ECA:  131 cm/sec LEFT ICA:  94/17 cm/sec CCA:  82/9 cm/sec SYSTOLIC ICA/CCA RATIO:  0.9 ECA:  104 cm/sec RIGHT CAROTID ARTERY: There is a mild amount of calcified plaque at the level of the carotid bulb and proximal right ICA. Estimated right ICA stenosis is less than 50%. RIGHT VERTEBRAL ARTERY: Antegrade flow with normal waveform and velocity. LEFT CAROTID ARTERY: No focal left-sided carotid plaque or stenosis in the neck. LEFT VERTEBRAL ARTERY: Antegrade flow with normal waveform and velocity. IMPRESSION: 1. Mild  amount of calcified plaque at the level of the right carotid bulb and proximal right ICA. Estimated right ICA stenosis is less than 50%. 2. No focal left-sided carotid plaque or stenosis in the neck. Electronically Signed   By: Irish Lack M.D.   On: 09/10/2023 09:34   ECHOCARDIOGRAM COMPLETE Result Date: 09/10/2023    ECHOCARDIOGRAM REPORT   Patient Name:   [facility] Date of Exam: 09/09/2023 Medical Rec #:  [id number]       Height:       61.0 in Accession #:    [id number]      Weight:       143.3 lb Date of Birth:  12-Jul-1945       BSA:          1.639 m Patient Age:    77 years        BP:           145/63 mmHg Patient Gender: F               HR:           85 bpm. Exam Location:  [facility]  Procedure: 2D Echo, Cardiac Doppler and Color Doppler (Both Spectral and Color            Flow Doppler were utilized during procedure). Indications:     I63.9 Stroke  History:         Patient has no prior history of Echocardiogram examinations.                  Risk Factors:Hypertension, Diabetes and Dyslipidemia.  Sonographer:     Daphine Deutscher RDCS Referring Phys:  [id number] Emeline General Diagnosing Phys: Yvonne Kendall MD IMPRESSIONS  1. Left ventricular ejection fraction, by estimation, is 65 to 70%. The left ventricle has normal function. Left ventricular endocardial border not optimally defined to evaluate regional wall motion. There is mild left ventricular hypertrophy. Left ventricular diastolic parameters are consistent with Grade I diastolic dysfunction (impaired relaxation). Elevated left atrial pressure.  2. Right ventricular systolic function is normal. The right ventricular size is normal.  3. The mitral valve is degenerative. Trivial mitral valve regurgitation. No evidence of mitral stenosis.  4. The aortic valve is tricuspid. There is mild calcification of the aortic valve. Aortic valve regurgitation is trivial. Aortic valve sclerosis is present, with no evidence of aortic valve stenosis.  5. There is mild dilatation of the ascending aorta, measuring 38 mm. FINDINGS  Left Ventricle: Left ventricular ejection fraction, by estimation, is 65 to 70%. The left ventricle has normal function. Left ventricular endocardial border not optimally defined to evaluate regional wall motion. The left ventricular internal cavity size was normal in size. There is mild left ventricular hypertrophy. Left ventricular diastolic parameters are consistent with Grade I diastolic dysfunction (impaired relaxation). Elevated left atrial pressure. Right Ventricle: The right ventricular size is normal. No increase in right ventricular wall thickness. Right ventricular systolic function is normal. Left Atrium: Left atrial  size was normal in size. Right Atrium: Right atrial size was normal in size. Pericardium: The pericardium was not well visualized. Mitral Valve: The mitral valve is degenerative in appearance. There is mild thickening of the mitral valve leaflet(s). Trivial mitral valve regurgitation. No evidence of mitral valve stenosis. Tricuspid Valve: The tricuspid valve is normal in structure. Tricuspid valve regurgitation is mild. Aortic Valve: The aortic valve is tricuspid. There is mild calcification of the aortic valve. Aortic valve regurgitation is trivial. Aortic  valve sclerosis is present, with no evidence of aortic valve stenosis. Aortic valve mean gradient measures 4.1 mmHg. Aortic valve peak gradient measures 7.6 mmHg. Aortic valve area, by VTI measures 2.13 cm. Pulmonic Valve: The pulmonic valve was grossly normal. Pulmonic valve regurgitation is mild. No evidence of pulmonic stenosis. Aorta: The aortic root is normal in size and structure. There is mild dilatation of the ascending aorta, measuring 38 mm. Venous: The inferior vena cava was not well visualized. IAS/Shunts: The interatrial septum was not well visualized.  LEFT VENTRICLE PLAX 2D LVIDd:         4.55 cm   Diastology LVIDs:         2.70 cm   LV e' medial:    4.52 cm/s LV PW:         1.20 cm   LV E/e' medial:  16.1 LV IVS:        1.20 cm   LV e' lateral:   7.67 cm/s LVOT diam:     1.90 cm   LV E/e' lateral: 9.5 LV SV:         56 LV SV Index:   34 LVOT Area:     2.84 cm  RIGHT VENTRICLE RV Basal diam:  3.80 cm RV S prime:     12.80 cm/s TAPSE (M-mode): 2.9 cm LEFT ATRIUM             Index        RIGHT ATRIUM           Index LA diam:        3.60 cm 2.20 cm/m   RA Area:     13.20 cm LA Vol (A2C):   40.1 ml 24.46 ml/m  RA Volume:   36.20 ml  22.08 ml/m LA Vol (A4C):   37.9 ml 23.12 ml/m LA Biplane Vol: 40.9 ml 24.95 ml/m  AORTIC VALVE AV Area (Vmax):    2.19 cm AV Area (Vmean):   2.41 cm AV Area (VTI):     2.13 cm AV Vmax:           137.57 cm/s AV  Vmean:          95.184 cm/s AV VTI:            0.261 m AV Peak Grad:      7.6 mmHg AV Mean Grad:      4.1 mmHg LVOT Vmax:         106.50 cm/s LVOT Vmean:        81.000 cm/s LVOT VTI:          0.196 m LVOT/AV VTI ratio: 0.75  AORTA Ao Root diam: 3.50 cm Ao Asc diam:  3.80 cm MITRAL VALVE               TRICUSPID VALVE MV Area (PHT): 3.48 cm    TR Peak grad:   28.7 mmHg MV Decel Time: 218 msec    TR Vmax:        268.00 cm/s MV E velocity: 72.80 cm/s MV A velocity: 94.90 cm/s  SHUNTS MV E/A ratio:  0.77        Systemic VTI:  0.20 m                            Systemic Diam: 1.90 cm Yvonne Kendall MD Electronically signed by Yvonne Kendall MD Signature Date/Time: 09/10/2023/7:36:13 AM    Final    EEG adult Result  Date: 09/09/2023 Charlsie Quest, MD     09/09/2023 12:07 PM Patient Name: Lavoris Canizales MRN: [id number] Epilepsy Attending: Charlsie Quest Referring Physician/Provider: Emeline General, MD Date: 09/09/2023 Duration: 25.55 mins Patient history: 78yo F with new left-sided weakness. EEG to evaluate for seizure Level of alertness: Awake, asleep AEDs during EEG study: None Technical aspects: This EEG study was done with scalp electrodes positioned according to the 10-20 International system of electrode placement. Electrical activity was reviewed with band pass filter of 1-70Hz , sensitivity of 7 uV/mm, display speed of 84mm/sec with a 60Hz  notched filter applied as appropriate. EEG data were recorded continuously and digitally stored.  Video monitoring was available and reviewed as appropriate. Description: The posterior dominant rhythm consists of 8 Hz activity of moderate voltage (25-35 uV) seen predominantly in posterior head regions, symmetric and reactive to eye opening and eye closing. Sleep was characterized by vertex waves, sleep spindles (12 to 14 Hz), maximal frontocentral region. Hyperventilation and photic stimulation were not performed.   IMPRESSION: This study is within normal limits. No seizures  or epileptiform discharges were seen throughout the recording. A normal interictal EEG does not exclude the diagnosis of epilepsy. Charlsie Quest   DG Chest 1 View Result Date: 09/08/2023 CLINICAL DATA:  Leukocytosis. EXAM: CHEST  1 VIEW COMPARISON:  Chest radiograph dated 11/04/2022. FINDINGS: Mild cardiomegaly with mild central vascular congestion. Faint left lower lung field density may represent atelectasis. Pneumonia is less likely but not excluded. No pleural effusion or pneumothorax. Atherosclerotic calcification of the aorta. No acute osseous pathology. IMPRESSION: 1. Mild cardiomegaly with mild central vascular congestion. 2. Left lower lung field atelectasis versus less likely pneumonia. Electronically Signed   By: Elgie Collard M.D.   On: 09/08/2023 20:23   MR BRAIN WO CONTRAST Result Date: 09/08/2023 CLINICAL DATA:  78 year old female referred for increasing weakness late last week. Reportedly is wheelchair bound with dementia, spasticity. EXAM: MRI HEAD WITHOUT CONTRAST TECHNIQUE: Multiplanar, multiecho pulse sequences of the brain and surrounding structures were obtained without intravenous contrast. CONTRAST:  A study without and with contrast was planned, but despite multiple attempts no IV access could be obtained and no contrast was administered. COMPARISON:  No prior brain imaging. FINDINGS: Brain: Patchy and confluent restricted diffusion throughout the medial right hemisphere corresponding to the right ACA vascular territory (series 15, image 42). Some direct involvement of the corpus callosum. No evidence of hemorrhagic transformation or mass effect. No other areas of diffusion restriction. But chronic severe T2 and FLAIR signal abnormality elsewhere in both hemispheres which most resembles advanced chronic ischemic disease. This includes numerous small areas of cystic encephalomalacia favored to be chronic lacunar infarcts in the bilateral deep gray nuclei, deep white matter  capsules. The main differential consideration is severe chronic demyelinating disease. But on SWI there is comparatively mild chronic hemosiderin in those areas. Possible small chronic microhemorrhage also in the left deep cerebellar nuclei. Mild to moderate patchy T2 heterogeneity in the pons. No definite cortical encephalomalacia. No midline shift, mass effect, evidence of mass lesion, ventriculomegaly, extra-axial collection or acute intracranial hemorrhage. Cervicomedullary junction and pituitary are within normal limits. Vascular: Major intracranial vascular flow voids are preserved. Skull and upper cervical spine: Normal background bone marrow signal. 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 No obvious cord signal abnormality. Sinuses/Orbits: Negative.  Paranasal  sinuses are well aerated. Other: Mastoids are well aerated.  Negative visible scalp and face. IMPRESSION: 1. Positive for Acute to subacute Right ACA territory infarct. No hemorrhagic transformation or mass effect. 2. Underlying severe chronic signal changes in the brain favored to be advanced chronic small vessel disease. With main differential consideration of severe chronic demyelinating disease. 3. 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 Spinal cord mass effect there, but no cord signal abnormality is evident. Study discussed by telephone with [name] PA on 09/08/2023 at 1153 hours. Electronically Signed   By: Odessa Fleming M.D.   On: 09/08/2023 12:10    Microbiology: Results for orders placed or performed during the hospital encounter of 11/04/22  Resp panel by RT-PCR (RSV, Flu A&B, Covid) Anterior Nasal Swab     Status: None   Collection Time: 11/04/22  4:25 PM   Specimen: Anterior Nasal Swab  Result  Value Ref Range Status   SARS Coronavirus 2 by RT PCR NEGATIVE NEGATIVE Final    Comment: (NOTE) SARS-CoV-2 target nucleic acids are NOT DETECTED.  The SARS-CoV-2 RNA is generally detectable in upper respiratory specimens during the acute phase of infection. The lowest concentration of SARS-CoV-2 viral copies this assay can detect is 138 copies/mL. A negative result does not preclude SARS-Cov-2 infection and should not be used as the sole basis for treatment or other patient management decisions. A negative result may occur with  improper specimen collection/handling, submission of specimen other than nasopharyngeal swab, presence of viral mutation(s) within the areas targeted by this assay, and inadequate number of viral copies(<138 copies/mL). A negative result must be combined with clinical observations, patient history, and epidemiological information. The expected result is Negative.  Fact Sheet for Patients:  BloggerCourse.com  Fact Sheet for Healthcare Providers:  SeriousBroker.it  This test is no t yet approved or cleared by the [place] FDA and  has been authorized for detection and/or diagnosis of SARS-CoV-2 by FDA under an Emergency Use Authorization (EUA). This EUA will remain  in effect (meaning this test can be used) for the duration of the COVID-19 declaration under Section 564(b)(1) of the Act, 21 U.S.C.section 360bbb-3(b)(1), unless the authorization is terminated  or revoked sooner.       Influenza A by PCR NEGATIVE NEGATIVE Final   Influenza B by PCR NEGATIVE NEGATIVE Final    Comment: (NOTE) The Xpert Xpress SARS-CoV-2/FLU/RSV plus assay is intended as an aid in the diagnosis of influenza from Nasopharyngeal swab specimens and should not be used as a sole basis for treatment. Nasal washings and aspirates are unacceptable for Xpert Xpress SARS-CoV-2/FLU/RSV testing.  Fact Sheet for  Patients: BloggerCourse.com  Fact Sheet for Healthcare Providers: SeriousBroker.it  This test is not yet approved or cleared by the [place] FDA and has been authorized for detection and/or diagnosis of SARS-CoV-2 by FDA under an Emergency Use Authorization (EUA). This EUA will remain in effect (meaning this test can be used) for the duration of the COVID-19 declaration under Section 564(b)(1) of the Act, 21 U.S.C. section 360bbb-3(b)(1), unless the authorization is terminated or revoked.     Resp Syncytial Virus by PCR NEGATIVE NEGATIVE Final    Comment: (NOTE) Fact Sheet for Patients: BloggerCourse.com  Fact Sheet for Healthcare Providers: SeriousBroker.it  This test is not yet approved or cleared by the [place] FDA and has been authorized for detection and/or diagnosis of SARS-CoV-2 by FDA under an Emergency Use Authorization (EUA). This EUA will remain  in effect (meaning this test can be used) for the duration of the COVID-19 declaration under Section 564(b)(1) of the Act, 21 U.S.C. section 360bbb-3(b)(1), unless the authorization is terminated or revoked.  Performed at [facility], [address] [place], [place] [address]   Blood Culture (routine x 2)     Status: None   Collection Time: 11/04/22  8:33 PM   Specimen: BLOOD  Result Value Ref Range Status   Specimen Description BLOOD BLOOD LEFT HAND  Final   Special Requests   Final    BOTTLES DRAWN AEROBIC AND ANAEROBIC Blood Culture adequate volume   Culture   Final    NO GROWTH 5 DAYS Performed at [facility], [address] [place], [place] [address]    Report Status 11/09/2022 FINAL  Final  Blood Culture (routine x 2)     Status: None   Collection Time: 11/04/22  8:35 PM   Specimen: BLOOD  Result Value Ref Range Status   Specimen Description BLOOD BLOOD RIGHT HAND  Final    Special Requests   Final    BOTTLES DRAWN AEROBIC AND ANAEROBIC Blood Culture results may not be optimal due to an inadequate volume of blood received in culture bottles   Culture   Final    NO GROWTH 5 DAYS Performed at [facility], [address] [place], [place] [address]    Report Status 11/09/2022 FINAL  Final    Labs: CBC: Recent Labs  Lab 09/08/23 1232 09/09/23 0529  WBC 13.6* 9.3  NEUTROABS 11.6*  --   HGB 12.2 11.6*  HCT 38.0 35.7*  MCV 94.1 90.6  PLT 184 202   Basic Metabolic Panel: Recent Labs  Lab 09/08/23 1232 09/09/23 0529  NA 143 142  K 3.7 3.8  CL 105 106  CO2 26 26  GLUCOSE 108* 83  BUN 19 19  CREATININE 1.46* 1.46*  CALCIUM 9.4 9.3   Liver Function Tests: Recent Labs  Lab 09/08/23 1232  AST 29  ALT 17  ALKPHOS 97  BILITOT 1.4*  PROT 7.8  ALBUMIN 3.9   CBG: No results for input(s): "GLUCAP" in the last 168 hours.  Discharge time spent: greater than 30 minutes.  Signed: Lucile Shutters, MD [facility] 09/10/2023

## 2024-01-16 ENCOUNTER — Inpatient Hospital Stay
Admission: EM | Admit: 2024-01-16 | Discharge: 2024-01-19 | DRG: 683 | Disposition: A | Discharge status: Home / Self Care | Payer: Medicare (Managed Care) | Source: Ambulatory Visit | Attending: Obstetrics and Gynecology | Admitting: Obstetrics and Gynecology

## 2024-01-16 ENCOUNTER — Other Ambulatory Visit: Payer: Self-pay

## 2024-01-16 ENCOUNTER — Encounter: Payer: Self-pay | Admitting: Emergency Medicine

## 2024-01-16 ENCOUNTER — Emergency Department: Payer: Medicare (Managed Care)

## 2024-01-16 DIAGNOSIS — E785 Hyperlipidemia, unspecified: Secondary | ICD-10-CM | POA: Diagnosis present

## 2024-01-16 DIAGNOSIS — I129 Hypertensive chronic kidney disease with stage 1 through stage 4 chronic kidney disease, or unspecified chronic kidney disease: Secondary | ICD-10-CM | POA: Diagnosis present

## 2024-01-16 DIAGNOSIS — N189 Chronic kidney disease, unspecified: Secondary | ICD-10-CM | POA: Diagnosis not present

## 2024-01-16 DIAGNOSIS — F0393 Unspecified dementia, unspecified severity, with mood disturbance: Secondary | ICD-10-CM | POA: Diagnosis present

## 2024-01-16 DIAGNOSIS — Z66 Do not resuscitate: Secondary | ICD-10-CM | POA: Diagnosis present

## 2024-01-16 DIAGNOSIS — N1832 Chronic kidney disease, stage 3b: Secondary | ICD-10-CM | POA: Diagnosis present

## 2024-01-16 DIAGNOSIS — R1310 Dysphagia, unspecified: Secondary | ICD-10-CM | POA: Diagnosis present

## 2024-01-16 DIAGNOSIS — N390 Urinary tract infection, site not specified: Secondary | ICD-10-CM | POA: Diagnosis present

## 2024-01-16 DIAGNOSIS — E1122 Type 2 diabetes mellitus with diabetic chronic kidney disease: Secondary | ICD-10-CM | POA: Diagnosis present

## 2024-01-16 DIAGNOSIS — E872 Acidosis, unspecified: Secondary | ICD-10-CM | POA: Diagnosis present

## 2024-01-16 DIAGNOSIS — K219 Gastro-esophageal reflux disease without esophagitis: Secondary | ICD-10-CM | POA: Diagnosis present

## 2024-01-16 DIAGNOSIS — Z87440 Personal history of urinary (tract) infections: Secondary | ICD-10-CM | POA: Diagnosis not present

## 2024-01-16 DIAGNOSIS — L97429 Non-pressure chronic ulcer of left heel and midfoot with unspecified severity: Secondary | ICD-10-CM | POA: Diagnosis present

## 2024-01-16 DIAGNOSIS — E11621 Type 2 diabetes mellitus with foot ulcer: Secondary | ICD-10-CM | POA: Diagnosis present

## 2024-01-16 DIAGNOSIS — E861 Hypovolemia: Secondary | ICD-10-CM | POA: Diagnosis present

## 2024-01-16 DIAGNOSIS — E860 Dehydration: Secondary | ICD-10-CM | POA: Diagnosis present

## 2024-01-16 DIAGNOSIS — Z7982 Long term (current) use of aspirin: Secondary | ICD-10-CM | POA: Diagnosis not present

## 2024-01-16 DIAGNOSIS — F419 Anxiety disorder, unspecified: Secondary | ICD-10-CM | POA: Diagnosis not present

## 2024-01-16 DIAGNOSIS — I69351 Hemiplegia and hemiparesis following cerebral infarction affecting right dominant side: Secondary | ICD-10-CM

## 2024-01-16 DIAGNOSIS — N179 Acute kidney failure, unspecified: Principal | ICD-10-CM | POA: Diagnosis present

## 2024-01-16 DIAGNOSIS — I10 Essential (primary) hypertension: Secondary | ICD-10-CM | POA: Diagnosis present

## 2024-01-16 DIAGNOSIS — F03C0 Unspecified dementia, severe, without behavioral disturbance, psychotic disturbance, mood disturbance, and anxiety: Secondary | ICD-10-CM | POA: Diagnosis present

## 2024-01-16 DIAGNOSIS — R55 Syncope and collapse: Secondary | ICD-10-CM

## 2024-01-16 DIAGNOSIS — I639 Cerebral infarction, unspecified: Secondary | ICD-10-CM | POA: Diagnosis present

## 2024-01-16 DIAGNOSIS — F32A Depression, unspecified: Secondary | ICD-10-CM | POA: Diagnosis present

## 2024-01-16 DIAGNOSIS — F0394 Unspecified dementia, unspecified severity, with anxiety: Secondary | ICD-10-CM | POA: Diagnosis present

## 2024-01-16 DIAGNOSIS — E119 Type 2 diabetes mellitus without complications: Secondary | ICD-10-CM

## 2024-01-16 LAB — COMPREHENSIVE METABOLIC PANEL WITH GFR
ALT: 17 U/L (ref 0–44)
AST: 19 U/L (ref 15–41)
Albumin: 3.8 g/dL (ref 3.5–5.0)
Alkaline Phosphatase: 166 U/L — ABNORMAL HIGH (ref 38–126)
Anion gap: 12 (ref 5–15)
BUN: 80 mg/dL — ABNORMAL HIGH (ref 8–23)
CO2: 16 mmol/L — ABNORMAL LOW (ref 22–32)
Calcium: 9.6 mg/dL (ref 8.9–10.3)
Chloride: 111 mmol/L (ref 98–111)
Creatinine, Ser: 4.72 mg/dL — ABNORMAL HIGH (ref 0.44–1.00)
GFR, Estimated: 9 mL/min — ABNORMAL LOW (ref >60)
Glucose, Bld: 116 mg/dL — ABNORMAL HIGH (ref 70–99)
Potassium: 4.5 mmol/L (ref 3.5–5.1)
Sodium: 139 mmol/L (ref 135–145)
Total Bilirubin: 0.6 mg/dL (ref 0.0–1.2)
Total Protein: 7.5 g/dL (ref 6.5–8.1)

## 2024-01-16 LAB — CBC WITH DIFFERENTIAL/PLATELET
Abs Immature Granulocytes: 0.02 K/uL (ref 0.00–0.07)
Basophils Absolute: 0 K/uL (ref 0.0–0.1)
Basophils Relative: 0 %
Eosinophils Absolute: 0.1 K/uL (ref 0.0–0.5)
Eosinophils Relative: 1 %
HCT: 36.7 % (ref 36.0–46.0)
Hemoglobin: 11.6 g/dL — ABNORMAL LOW (ref 12.0–15.0)
Immature Granulocytes: 0 %
Lymphocytes Relative: 8 %
Lymphs Abs: 0.6 K/uL — ABNORMAL LOW (ref 0.7–4.0)
MCH: 27.3 pg (ref 26.0–34.0)
MCHC: 31.6 g/dL (ref 30.0–36.0)
MCV: 86.4 fL (ref 80.0–100.0)
Monocytes Absolute: 0.6 K/uL (ref 0.1–1.0)
Monocytes Relative: 7 %
Neutro Abs: 6.5 K/uL (ref 1.7–7.7)
Neutrophils Relative %: 84 %
Platelets: 239 K/uL (ref 150–400)
RBC: 4.25 MIL/uL (ref 3.87–5.11)
RDW: 16.2 % — ABNORMAL HIGH (ref 11.5–15.5)
WBC: 7.8 K/uL (ref 4.0–10.5)
nRBC: 0 % (ref 0.0–0.2)

## 2024-01-16 LAB — CK: Total CK: 267 U/L — ABNORMAL HIGH (ref 38–234)

## 2024-01-16 LAB — TROPONIN I (HIGH SENSITIVITY)
Troponin I (High Sensitivity): 15 ng/L (ref <18)
Troponin I (High Sensitivity): 17 ng/L (ref <18)

## 2024-01-16 MED ORDER — ATORVASTATIN CALCIUM 20 MG PO TABS
40.0000 mg | ORAL_TABLET | Freq: Every day | ORAL | Status: DC
  Administered 2024-01-17 – 2024-01-18 (×2): 40 mg via ORAL
  Filled 2024-01-16 (×3): qty 2

## 2024-01-16 MED ORDER — PRENATAL MULTIVITAMIN CH
1.0000 | ORAL_TABLET | Freq: Every day | ORAL | Status: DC
  Administered 2024-01-17 – 2024-01-19 (×3): 1 via ORAL
  Filled 2024-01-16 (×3): qty 1

## 2024-01-16 MED ORDER — LACTATED RINGERS IV BOLUS
1000.0000 mL | Freq: Once | INTRAVENOUS | Status: AC
  Administered 2024-01-16: 1000 mL via INTRAVENOUS

## 2024-01-16 MED ORDER — ACETAMINOPHEN 325 MG PO TABS: 650.0000 mg | ORAL_TABLET | Freq: Four times a day (QID) | ORAL | Status: DC | PRN

## 2024-01-16 MED ORDER — ENOXAPARIN SODIUM 40 MG/0.4ML IJ SOSY: 40.0000 mg | PREFILLED_SYRINGE | INTRAMUSCULAR | Status: DC

## 2024-01-16 MED ORDER — MIRTAZAPINE 15 MG PO TABS
7.5000 mg | ORAL_TABLET | Freq: Every day | ORAL | Status: DC
  Administered 2024-01-16 – 2024-01-18 (×3): 7.5 mg via ORAL
  Filled 2024-01-16 (×3): qty 1

## 2024-01-16 MED ORDER — ONDANSETRON HCL 4 MG/2ML IJ SOLN: 4.0000 mg | Freq: Four times a day (QID) | INTRAMUSCULAR | Status: DC | PRN

## 2024-01-16 MED ORDER — ESCITALOPRAM OXALATE 10 MG PO TABS
10.0000 mg | ORAL_TABLET | Freq: Every day | ORAL | Status: DC
  Administered 2024-01-17 – 2024-01-19 (×3): 10 mg via ORAL
  Filled 2024-01-16 (×3): qty 1

## 2024-01-16 MED ORDER — PANTOPRAZOLE SODIUM 40 MG PO TBEC
40.0000 mg | DELAYED_RELEASE_TABLET | Freq: Two times a day (BID) | ORAL | Status: DC
  Administered 2024-01-16 – 2024-01-19 (×6): 40 mg via ORAL
  Filled 2024-01-16 (×6): qty 1

## 2024-01-16 MED ORDER — ESOMEPRAZOLE MAGNESIUM 10 MG PO PACK: 10.0000 mg | PACK | Freq: Two times a day (BID) | ORAL | Status: DC

## 2024-01-16 MED ORDER — BUSPIRONE HCL 10 MG PO TABS
5.0000 mg | ORAL_TABLET | Freq: Two times a day (BID) | ORAL | Status: DC
  Administered 2024-01-16 – 2024-01-19 (×6): 5 mg via ORAL
  Filled 2024-01-16 (×6): qty 1

## 2024-01-16 MED ORDER — SODIUM CHLORIDE 0.9 % IV SOLN: INTRAVENOUS | Status: AC

## 2024-01-16 MED ORDER — ONDANSETRON HCL 4 MG PO TABS: 4.0000 mg | ORAL_TABLET | Freq: Four times a day (QID) | ORAL | Status: DC | PRN

## 2024-01-16 MED ORDER — TIZANIDINE HCL 2 MG PO TABS
2.0000 mg | ORAL_TABLET | Freq: Every day | ORAL | Status: DC
  Administered 2024-01-16 – 2024-01-18 (×3): 2 mg via ORAL
  Filled 2024-01-16 (×4): qty 1

## 2024-01-16 MED ORDER — TRAZODONE HCL 50 MG PO TABS
25.0000 mg | ORAL_TABLET | Freq: Every evening | ORAL | Status: DC | PRN
  Administered 2024-01-17 – 2024-01-18 (×2): 25 mg via ORAL
  Filled 2024-01-16 (×2): qty 1

## 2024-01-16 MED ORDER — MAGNESIUM HYDROXIDE 400 MG/5ML PO SUSP: 30.0000 mL | Freq: Every day | ORAL | Status: DC | PRN

## 2024-01-16 MED ORDER — HEPARIN SODIUM (PORCINE) 5000 UNIT/ML IJ SOLN
5000.0000 [IU] | Freq: Three times a day (TID) | INTRAMUSCULAR | Status: DC
  Administered 2024-01-16 – 2024-01-19 (×9): 5000 [IU] via SUBCUTANEOUS
  Filled 2024-01-16 (×9): qty 1

## 2024-01-16 MED ORDER — ACETAMINOPHEN 650 MG RE SUPP: 650.0000 mg | Freq: Four times a day (QID) | RECTAL | Status: DC | PRN

## 2024-01-16 MED ORDER — ASPIRIN 81 MG PO TBEC
81.0000 mg | DELAYED_RELEASE_TABLET | Freq: Every day | ORAL | Status: DC
  Administered 2024-01-17 – 2024-01-19 (×3): 81 mg via ORAL
  Filled 2024-01-16 (×3): qty 1

## 2024-01-16 NOTE — ED Triage Notes (Signed)
 Pt BIB EMS for elevated BUN/creatinine levels and syncopal episodes over the past week.  Doctors office sent over for IV fluids.

## 2024-01-16 NOTE — Progress Notes (Signed)
 Anticoagulation monitoring(Lovenox ):  78 yo female ordered Lovenox  40 mg Q24h    Filed Weights   01/16/24 1820  Weight: 65 kg (143 lb 4.8 oz)   BMI 27   Lab Results  Component Value Date   CREATININE 4.72 (H) 01/16/2024   CREATININE 1.46 (H) 09/09/2023   CREATININE 1.46 (H) 09/08/2023   Estimated Creatinine Clearance: 8.3 mL/min (A) (by C-G formula based on SCr of 4.72 mg/dL (H)). Hemoglobin & Hematocrit     Component Value Date/Time   HGB 11.6 (L) 01/16/2024 1849   HCT 36.7 01/16/2024 1849     Per Protocol for Patient with estCrcl < 15 ml/min and BMI < 40, will transition to Heparin  5000 units SQ Q8H.

## 2024-01-16 NOTE — ED Provider Notes (Signed)
 Kaitlin Wagner Provider Note    Event Date/Time   First MD Initiated Contact with Patient 01/16/24 1811     (approximate)   History   Abnormal Lab   HPI  Kaitlin Wagner is a 78 y.o. female with history of CVA with right-sided weakness and contracture, dementia, hypertension, CKD, presenting with syncopal episodes in the past week as well as elevated BUN and creatinine.  With sent in by her doctor to get IV hydration.  She denies any being any symptoms.  No chest pain or shortness of breath, no new weakness or numbness.  She is not coughing or have any burning when she pees, no nausea vomiting or diarrhea.  No abdominal pain.  On independent chart review, she was admitted in March of this year, has chronic right sided weakness and contracture, presented with possible strokelike symptoms due to new left-sided weakness.  An outpatient MRI had showed a right ACA territory stroke that was subacute.  Was placed on aspirin  and Plavix  for 21 days and then resumed aspirin  monotherapy after.  Independent history obtained from primary care doctor, her number is [phone].  States that patient's baseline creatinine is 1.5, was diagnosed with a UTI 3 days ago, was given some IM ceftriaxone  and is now day 3 of amoxicillin  for which her urine cultures that grew back are susceptible to.  Has been having diarrhea last week, had some several episodes of nausea vomiting, none reported today.  Had had 2 syncopal episodes, one while in her lift and another 1 in her wheelchair.  No known falls or trauma.  Had noted labs today, her creatinine was 4.35 and BUN was 68.  Also noted that her Creacy CK was mildly elevated in the past.  Patient is DNR/DNI but okay to be admitted and to treat may needing reversible.     Physical Exam   Triage Vital Signs: ED Triage Vitals  Encounter Vitals Group     BP      Girls Systolic BP Percentile      Girls Diastolic BP Percentile      Boys  Systolic BP Percentile      Boys Diastolic BP Percentile      Pulse      Resp      Temp      Temp src      SpO2      Weight      Height      Head Circumference      Peak Flow      Pain Score      Pain Loc      Pain Education      Exclude from Growth Chart     Most recent vital signs: Vitals:   01/16/24 1830 01/16/24 1900  BP: (!) 91/50 98/70  Pulse: 84 81  SpO2: 100% 95%     General: Awake, no distress.  CV:  Good peripheral perfusion.  Resp:  Normal effort.  Clear Abd:  No distention.  Soft nontender Other:  Dry mucous membranes, left lower facial droop, right arm is contracted which is chronic, able to grip bilaterally, both grip strength are weak, able to lift both lower extremities although left lower extremity appears weaker than the right, no obvious slurred speech, she denies any numbness   ED Results / Procedures / Treatments   Labs (all labs ordered are listed, but only abnormal results are displayed) Labs Reviewed  COMPREHENSIVE METABOLIC PANEL WITH GFR -  Abnormal; Notable for the following components:      Result Value   CO2 16 (*)    Glucose, Bld 116 (*)    BUN 80 (*)    Creatinine, Ser 4.72 (*)    Alkaline Phosphatase 166 (*)    GFR, Estimated 9 (*)    All other components within normal limits  CBC WITH DIFFERENTIAL/PLATELET - Abnormal; Notable for the following components:   Hemoglobin 11.6 (*)    RDW 16.2 (*)    Lymphs Abs 0.6 (*)    All other components within normal limits  URINALYSIS, W/ REFLEX TO CULTURE (INFECTION SUSPECTED)  CK  TROPONIN I (HIGH SENSITIVITY)  TROPONIN I (HIGH SENSITIVITY)     EKG  EKG shows, EKG shows sinus rhythm, rate 84, normal QS, normal QTc, no obvious ischemic ST elevation, T wave flattening in 3, not centimeters compared to prior   RADIOLOGY On my independent interpretation, chest x-ray without obvious consolidation   PROCEDURES:  Critical Care performed: No  Procedures   MEDICATIONS ORDERED IN  ED: Medications  lactated ringers  bolus 1,000 mL (1,000 mLs Intravenous New Bag/Given 01/16/24 1851)     IMPRESSION / MDM / ASSESSMENT AND PLAN / ED COURSE  I reviewed the triage vital signs and the nursing notes.                              Differential diagnosis includes, but is not limited to, dehydration, renal dysfunction, UTI, arrhythmia, atypical ACS.  Will get labs, EKG, troponin, UA.  IV fluids.  Patient does not appear to have any new focal deficits  Patient's presentation is most consistent with acute presentation with potential threat to life or bodily function.  Independent interpretation of labs and imaging below.  Given her AKI and dehydration, she will need be admitted for further management.  Consult to hospice is agreeable with the plan for admission and will evaluate the patient.  She is admitted.  The patient is on the cardiac monitor to evaluate for evidence of arrhythmia and/or significant heart rate changes.   Clinical Course as of 01/16/24 2012  Fri Jan 16, 2024  1951 Independent review of labs, no leukocytosis, electrolytes not severely deranged, she does have some mild acidosis and an AKI, suspect this may be due to dehydration. [TT]  1952 DG Chest 1 View 1. No acute findings.  [TT]    Clinical Course User Index [TT] Waymond, Lorelle Cummins, MD     FINAL CLINICAL IMPRESSION(S) / ED DIAGNOSES   Final diagnoses:  AKI (acute kidney injury) (HCC)  Dehydration     Rx / DC Orders   ED Discharge Orders     None        Note:  This document was prepared using Dragon voice recognition software and may include unintentional dictation errors.    Waymond Lorelle Cummins, MD 01/16/24 2012

## 2024-01-16 NOTE — H&P (Signed)
 [name]   PATIENT NAME: Kaitlin Wagner    MR#:  [id number]  DATE OF BIRTH:  August 24, 1945  DATE OF ADMISSION:  01/16/2024  PRIMARY CARE PHYSICIAN: Eleanore Ronal RAMAN, MD   Patient is coming from: Home  REQUESTING/REFERRING PHYSICIAN: Waymond Lorelle Cummins, MD  CHIEF COMPLAINT:   Chief Complaint  Patient presents with   Abnormal Lab    HISTORY OF PRESENT ILLNESS:  Kaitlin Wagner is a 78 y.o. female with medical history significant for type 2 diabetes mellitus, hypertension dyslipidemia  CVA and right-sided weakness with contractures, dementia and GERD, presented to the emergency room with acute onset of altered mental status with syncopal episodes for the past week and elevated BUN and creatinine.  The patient is a very poor historian and basically nonverbal.  No reported chest pain or palpitations.  No reported cough or wheezing.  No reported abdominal pain or melena or bright red bleeding per rectum.  The patient's baseline creatinine is 1.5 and she had a UTI 3 days ago per her PCP who was called today.  She was given IM Rocephin  and is now on day 3 of amoxicillin  after results of urine culture.  The patient is been having diarrhea last week and had some episodes of nausea and vomiting reportedly today.  She had 2 syncopal episodes 1 while in her left and another 1 in her wheelchair.  Creatinine was up to 4.35 with a BUN of 68 and her CK was mildly elevated.  ED Course: When the patient came to the ER, BP was 85/45 with improvement with hydration with otherwise normal vital signs.  Labs revealed BUN of 80 and creatinine 4.72 compared to 19 and 1.46 on 09/09/2023.  Alk phos was 166 and total CK was 267 and high-sensitivity troponin I 15 and later 17.  CBC showed hemoglobin 11.6 and hematocrit 36.7 close to previous levels. EKG as reviewed by me : EKG showed sinus rhythm with rate of 84 with old Q waves/suspected infarct. Imaging: Portable chest x-ray showed no acute findings..  The patient was  given 1 L bolus of IV normal saline, 4 mg of IV Zofran , 4 mg of IV morphine sulfate and 10 mg 1 IV potassium chloride .  She will be admitted to a medical telemetry bed for further evaluation and management. PAST MEDICAL HISTORY:   Past Medical History:  Diagnosis Date   Aortic atherosclerosis (HCC) 08/05/2021   Cancer (HCC) colon   Diabetes mellitus without complication (HCC)    GERD (gastroesophageal reflux disease)    Hyperlipidemia    Hypertension     PAST SURGICAL HISTORY:   Past Surgical History:  Procedure Laterality Date   CHOLECYSTECTOMY     colon resection     laparoscopic   COLON SURGERY     COLONOSCOPY WITH PROPOFOL  N/A 05/12/2015   Procedure: COLONOSCOPY WITH PROPOFOL ;  Surgeon: Deward CINDERELLA Piedmont, MD;  Location: [facility] ENDOSCOPY;  Service: Gastroenterology;  Laterality: N/A;   COLONOSCOPY WITH PROPOFOL  N/A 05/12/2018   Procedure: COLONOSCOPY WITH PROPOFOL ;  Surgeon: Toledo, Ladell POUR, MD;  Location: [facility] ENDOSCOPY;  Service: Gastroenterology;  Laterality: N/A;   ESOPHAGOGASTRODUODENOSCOPY N/A 05/12/2015   Procedure: ESOPHAGOGASTRODUODENOSCOPY (EGD);  Surgeon: Deward CINDERELLA Piedmont, MD;  Location: [facility] ENDOSCOPY;  Service: Gastroenterology;  Laterality: N/A;    SOCIAL HISTORY:   Social History   Tobacco Use   Smoking status: Never   Smokeless tobacco: Never  Substance Use Topics   Alcohol use: No    FAMILY  HISTORY:  History reviewed. No pertinent family history. Not obtainable due to her dementia. DRUG ALLERGIES:  No Known Allergies  REVIEW OF SYSTEMS:   ROS As per history of present illness otherwise unobtainable as mentioned above.  MEDICATIONS AT HOME:   Prior to Admission medications   Medication Sig Start Date End Date Taking? Authorizing Provider  Acetaminophen  Childrens 160 MG/5ML SOLN Take 20 mLs by mouth 3 (three) times daily as needed for pain. 04/28/20   [provider]  amoxicillin -clavulanate (AUGMENTIN ) 875-125 MG tablet Take 1 tablet by mouth 2 (two)  times daily. 09/05/23   [provider]  ANTI-ITCH lotion Apply 1 Application topically as needed. 10/29/22   [provider]  aspirin  EC 81 MG tablet Take 81 mg by mouth daily.    [provider]  atorvastatin  (LIPITOR) 40 MG tablet Take 1 tablet (40 mg total) by mouth at bedtime. 09/09/23 10/09/23  Kaitlin Calvin NOVAK, MD  busPIRone  (BUSPAR ) 5 MG tablet Take 5 mg by mouth 2 (two) times daily.    [provider]  escitalopram  (LEXAPRO ) 10 MG tablet Take 10 mg by mouth daily.    [provider]  esomeprazole  (NEXIUM ) 10 MG packet Take 10 mg by mouth 2 (two) times daily.    [provider]  mirtazapine  (REMERON ) 7.5 MG tablet Take 1 tablet (7.5 mg total) by mouth at bedtime. 08/16/21   Amin, Burgess BROCKS, MD  Prenatal Vit-Fe Fumarate-FA (PREPLUS) 27-1 MG TABS Take 1 tablet by mouth daily. 05/05/20   [provider]  telmisartan (MICARDIS) 20 MG tablet Take 20 mg by mouth daily.    [provider]  tiZANidine  (ZANAFLEX ) 2 MG tablet Take 2 mg by mouth at bedtime. 09/04/23   [provider]      VITAL SIGNS:  Blood pressure (!) 144/61, pulse 89, temperature (!) 97.4 F (36.3 C), temperature source Oral, resp. rate 18, height 5' 1 (1.549 m), weight 65 kg, SpO2 100%.  PHYSICAL EXAMINATION:  Physical Exam  GENERAL:  78 y.o.-year-old patient lying in the bed with no acute distress.  EYES: Pupils equal, round, reactive to light and accommodation. No scleral icterus. Extraocular muscles intact.  HEENT: Head atraumatic, normocephalic. Oropharynx with slightly dry mucous membrane and tongue and nasopharynx clear.  NECK:  Supple, no jugular venous distention. No thyroid enlargement, no tenderness.  LUNGS: Normal breath sounds bilaterally, no wheezing, rales,rhonchi or crepitation. No use of accessory muscles of respiration.  CARDIOVASCULAR: Regular rate and rhythm, S1, S2 normal. No murmurs, rubs, or gallops.  ABDOMEN: Soft,  nondistended, nontender. Bowel sounds present. No organomegaly or mass.  EXTREMITIES: No pedal edema, cyanosis, or clubbing.  NEUROLOGIC: Has right-sided hemiparesis.  Gait not checked.  PSYCHIATRIC: The patient is alert and oriented x 3.  Normal affect and good eye contact. SKIN: No obvious rash, lesion, or ulcer.   LABORATORY PANEL:   CBC Recent Labs  Lab 01/16/24 1849  WBC 7.8  HGB 11.6*  HCT 36.7  PLT 239   ------------------------------------------------------------------------------------------------------------------  Chemistries  Recent Labs  Lab 01/16/24 1849  NA 139  K 4.5  CL 111  CO2 16*  GLUCOSE 116*  BUN 80*  CREATININE 4.72*  CALCIUM  9.6  AST 19  ALT 17  ALKPHOS 166*  BILITOT 0.6   ------------------------------------------------------------------------------------------------------------------  Cardiac Enzymes No results for input(s): TROPONINI in the last 168 hours. ------------------------------------------------------------------------------------------------------------------  RADIOLOGY:  DG Chest 1 View Result Date: 01/16/2024 EXAM: 1 VIEW XRAY OF THE CHEST 01/16/2024 07:36:00 PM COMPARISON:  09/08/2023 CLINICAL HISTORY: 106001 Syncope 106001. Pt BIB EMS for elevated BUN/creatinine levels and syncopal episodes over the past week. Doctors office sent over for IV fluids. Pt BIB EMS for elevated BUN/creatinine levels and syncopal episodes over the past week. Doctors office sent over for IV fluids. FINDINGS: LUNGS AND PLEURA: No focal pulmonary opacity. No pulmonary edema. No pleural effusion. No pneumothorax. Chronic blunting of the left costophrenic angle, favoring epicardial fat when correlating with prior CT. HEART AND MEDIASTINUM: No acute abnormality of the cardiac and mediastinal silhouettes. Thoracic aortic atherosclerosis. BONES AND SOFT TISSUES: No acute osseous abnormality. Cholecystectomy clips. IMPRESSION: 1. No acute findings. Electronically  signed by: Pinkie Pebbles MD 01/16/2024 07:44 PM EDT RP Workstation: [id number]      IMPRESSION AND PLAN:  Assessment and Plan: * Acute kidney injury superimposed on chronic kidney disease (HCC) - This is like a prerenal due to volume depletion and dehydration from nausea, vomiting and diarrhea. - She will be admitted to a medical telemetry bed. - Will continue addition with IV normal saline. - Will hold off nephrotoxins. - Will follow BMPs.  Syncope, vasovagal - This is likely secondary to hypovolemia and hypotension. - Will continue monitoring for arrhythmias. - Will monitor for hypoglycemia.  Dyslipidemia - Will continue statin therapy.  GERD without esophagitis - Will continue PPI therapy.  Anxiety and depression - Will continue BuSpar  and trazodone  and Lexapro .   DVT prophylaxis: Lovenox .  Advanced Care Planning:  Code Status: full code.  Family Communication:  The plan of care was discussed in details with the patient (and family). I answered all questions. The patient agreed to proceed with the above mentioned plan. Further management will depend upon hospital course. Disposition Plan: Back to previous home environment Consults called: none.  All the records are reviewed and case discussed with ED provider.  Status is: Inpatient  At the time of the admission, it appears that the appropriate admission status for this patient is inpatient.  This is judged to be reasonable and necessary in order to provide the required intensity of service to ensure the patient's safety given the presenting symptoms, physical exam findings and initial radiographic and laboratory data in the context of comorbid conditions.  The patient requires inpatient status due to high intensity of service, high risk of further deterioration and high frequency of surveillance required.  I certify that at the time of admission, it is my clinical judgment that the patient will require inpatient  hospital care extending more than 2 midnights.                            Dispo: The patient is from: Home              Anticipated d/c is to: Home              Patient currently is not medically stable to d/c.              Difficult to place patient: No  Madison DELENA Peaches M.D on 01/17/2024 at 2:01 AM  [facility]   From 7 PM-7 AM, contact night-coverage [email or web address]  CC: Primary care physician; Eleanore Ronal RAMAN, MD

## 2024-01-17 ENCOUNTER — Other Ambulatory Visit: Payer: Self-pay

## 2024-01-17 DIAGNOSIS — N189 Chronic kidney disease, unspecified: Secondary | ICD-10-CM

## 2024-01-17 DIAGNOSIS — R55 Syncope and collapse: Secondary | ICD-10-CM

## 2024-01-17 DIAGNOSIS — N179 Acute kidney failure, unspecified: Secondary | ICD-10-CM | POA: Diagnosis not present

## 2024-01-17 DIAGNOSIS — E785 Hyperlipidemia, unspecified: Secondary | ICD-10-CM

## 2024-01-17 DIAGNOSIS — F419 Anxiety disorder, unspecified: Secondary | ICD-10-CM | POA: Insufficient documentation

## 2024-01-17 DIAGNOSIS — K219 Gastro-esophageal reflux disease without esophagitis: Secondary | ICD-10-CM | POA: Insufficient documentation

## 2024-01-17 LAB — URINALYSIS, W/ REFLEX TO CULTURE (INFECTION SUSPECTED)
Bilirubin Urine: NEGATIVE
Glucose, UA: NEGATIVE mg/dL
Ketones, ur: NEGATIVE mg/dL
Nitrite: NEGATIVE
Protein, ur: NEGATIVE mg/dL
Specific Gravity, Urine: 1.012 (ref 1.005–1.030)
pH: 5 (ref 5.0–8.0)

## 2024-01-17 LAB — GASTROINTESTINAL PANEL BY PCR, STOOL (REPLACES STOOL CULTURE)

## 2024-01-17 LAB — CBC
HCT: 34.9 % — ABNORMAL LOW (ref 36.0–46.0)
Hemoglobin: 11 g/dL — ABNORMAL LOW (ref 12.0–15.0)
MCH: 27.3 pg (ref 26.0–34.0)
MCHC: 31.5 g/dL (ref 30.0–36.0)
MCV: 86.6 fL (ref 80.0–100.0)
Platelets: 223 K/uL (ref 150–400)
RBC: 4.03 MIL/uL (ref 3.87–5.11)
RDW: 16 % — ABNORMAL HIGH (ref 11.5–15.5)
WBC: 7.2 K/uL (ref 4.0–10.5)
nRBC: 0 % (ref 0.0–0.2)

## 2024-01-17 LAB — RESPIRATORY PANEL BY PCR

## 2024-01-17 LAB — BASIC METABOLIC PANEL WITH GFR
Anion gap: 7 (ref 5–15)
BUN: 71 mg/dL — ABNORMAL HIGH (ref 8–23)
CO2: 19 mmol/L — ABNORMAL LOW (ref 22–32)
Calcium: 9.2 mg/dL (ref 8.9–10.3)
Chloride: 115 mmol/L — ABNORMAL HIGH (ref 98–111)
Creatinine, Ser: 3.95 mg/dL — ABNORMAL HIGH (ref 0.44–1.00)
GFR, Estimated: 11 mL/min — ABNORMAL LOW (ref >60)
Glucose, Bld: 79 mg/dL (ref 70–99)
Potassium: 4.3 mmol/L (ref 3.5–5.1)
Sodium: 141 mmol/L (ref 135–145)

## 2024-01-17 LAB — C DIFFICILE QUICK SCREEN W PCR REFLEX
C Diff antigen: NEGATIVE
C Diff interpretation: NOT DETECTED
C Diff toxin: NEGATIVE

## 2024-01-17 LAB — SARS CORONAVIRUS 2 BY RT PCR: SARS Coronavirus 2 by RT PCR: NEGATIVE

## 2024-01-17 MED ORDER — HEPARIN SODIUM (PORCINE) 5000 UNIT/ML IJ SOLN: 5000.0000 [IU] | Freq: Three times a day (TID) | INTRAMUSCULAR | Status: DC

## 2024-01-17 NOTE — Plan of Care (Signed)

## 2024-01-17 NOTE — Assessment & Plan Note (Addendum)
-   This is likely secondary to hypovolemia and hypotension. - Will continue monitoring for arrhythmias. - Will monitor for hypoglycemia.

## 2024-01-17 NOTE — Assessment & Plan Note (Signed)
-   This is like a prerenal due to volume depletion and dehydration from nausea, vomiting and diarrhea. - She will be admitted to a medical telemetry bed. - Will continue addition with IV normal saline. - Will hold off nephrotoxins. - Will follow BMPs.

## 2024-01-17 NOTE — Assessment & Plan Note (Addendum)
 Will continue PPI therapy.

## 2024-01-17 NOTE — Assessment & Plan Note (Signed)
 Will continue statin therapy

## 2024-01-17 NOTE — Progress Notes (Addendum)
 PROGRESS NOTE    Kaitlin Wagner  FMW:[id number] DOB: Nov 11, 1945 DOA: 01/16/2024 PCP: Eleanore Ronal RAMAN, MD  Outpatient Specialists: none    Brief Narrative:   Kaitlin Wagner is a 78 y.o. female with history of CVA with right-sided weakness and contracture, dementia, hypertension, CKD, presenting with syncopal episodes in the past week as well as elevated BUN and creatinine.  With sent in by her doctor to get IV hydration.  She denies any being any symptoms.  No chest pain or shortness of breath, no new weakness or numbness.  She is not coughing or have any burning when she pees, no nausea vomiting or diarrhea.  No abdominal pain.   On independent chart review, she was admitted in March of this year, has chronic right sided weakness and contracture, presented with possible strokelike symptoms due to new left-sided weakness.  An outpatient MRI had showed a right ACA territory stroke that was subacute.  Was placed on aspirin  and Plavix  for 21 days and then resumed aspirin  monotherapy after.   Independent history obtained from primary care doctor, her number is [phone], [phone].  States that patient's baseline creatinine is 1.5, was diagnosed with a UTI 3 days ago, was given some IM ceftriaxone  and is now day 3 of amoxicillin  for which her urine cultures that grew back are susceptible to.  Has been having diarrhea last week, had some several episodes of nausea vomiting, none reported today.  Had had 2 syncopal episodes, one while in her lift and another 1 in her wheelchair.  No known falls or trauma.  Had noted labs today, her creatinine was 4.35 and BUN was 68.  Also noted that her Creacy CK was mildly elevated in the past.  Patient is DNR/DNI but okay to be admitted and to treat may needing reversible.   Assessment & Plan:   Principal Problem:   Acute kidney injury superimposed on chronic kidney disease (HCC) Active Problems:   Syncope, vasovagal   Dyslipidemia   Diabetes mellitus type 2,  uncomplicated (HCC)   Advanced dementia (HCC)   Essential (primary) hypertension   CKD stage 3b, GFR 30-44 ml/min (HCC)   Stroke (cerebrum) (HCC)   Anxiety and depression   GERD without esophagitis  # AKI on ckd 3b # Metabolic acidosis Appears to be pre-renal. Advanced dementia may be driving this though apparently recent GI illness. Kidney function improving with fluids. Baseline cr is around 1.5 - continue IVF  # Recent diarrhea None thus far here - c diff and gi pathogen panel if diarrhea - will hold on ct abdomen/pelvis given daughter's desire to not escalate care for the time being - blood cultures  # Rhinorrhea Per family - respiratory panels  # Recent UTI EDP spoke with PCP who ways patient recently treated for uti and abx were transitioned to amoxicillin  based on sensitivities. Apparently received IM ceftriaxone  x1 and as of yesterday was on day 3 of amoxicillin  - I/o cath for ua  # Advanced dementia # GAD PACE participant with [facility]. No behavior disturbance 0 home buspar , lexapro , mirtazapine   # T2DM Med rec pending but no significant hyperglycemia  # Dysphagia Daughter says currently on pureed diet - dysphagia 1 diet  # History CVA - cont home asa, statin  # Goals of care Extensive discussion with daughter today regarding patient's advanced dementia, poor quality of life. Daughter does not think mother at this point would wish to continue to pursue life-extending therapies. She wishes to speak with  other family members first but is strongly considering transitioning to comfort care. For now wants to continue our current scope of care (15 min of advanced care planning today) - will continue these discussions  # HTN Normotensive currently    DVT prophylaxis: heparin  Code Status: dnr/dni Family Communication: daughter updated telephonically 7/19  Level of care: Telemetry Medical Status is: Inpatient Remains inpatient appropriate because:  severity of illness    Consultants:  none  Procedures: none  Antimicrobials:  none    Subjective: No complaints  Objective: Vitals:   01/16/24 1910 01/16/24 2029 01/16/24 2235 01/17/24 0720  BP: (!) 143/71 (!) 142/75 (!) 144/61 124/80  Pulse: 86 87 89 80  Resp:  17 18 18   Temp:  98.5 F (36.9 C) (!) 97.4 F (36.3 C) 98.2 F (36.8 C)  TempSrc:  Oral Oral Oral  SpO2: 100% 99% 100% 97%  Weight:      Height:        Intake/Output Summary (Last 24 hours) at 01/17/2024 0846 Last data filed at 01/17/2024 9347 Gross per 24 hour  Intake 693.18 ml  Output --  Net 693.18 ml   Filed Weights   01/16/24 1820  Weight: 65 kg    Examination:  General exam: Appears calm and comfortable. Chronically ill Respiratory system: Clear to auscultation save for bibasilar rales Cardiovascular system: S1 & S2 heard, RRR. Soft systolic murmur Gastrointestinal system: Abdomen is nondistended, soft and nontender. Central nervous system: Alert, not oriented Extremities: cool Skin: ulcer left heel Psychiatry: pleasantly demented    Data Reviewed: I have personally reviewed following labs and imaging studies  CBC: Recent Labs  Lab 01/16/24 1849 01/17/24 0339  WBC 7.8 7.2  NEUTROABS 6.5  --   HGB 11.6* 11.0*  HCT 36.7 34.9*  MCV 86.4 86.6  PLT 239 223   Basic Metabolic Panel: Recent Labs  Lab 01/16/24 1849 01/17/24 0339  NA 139 141  K 4.5 4.3  CL 111 115*  CO2 16* 19*  GLUCOSE 116* 79  BUN 80* 71*  CREATININE 4.72* 3.95*  CALCIUM  9.6 9.2   GFR: Estimated Creatinine Clearance: 10 mL/min (A) (by C-G formula based on SCr of 3.95 mg/dL (H)). Liver Function Tests: Recent Labs  Lab 01/16/24 1849  AST 19  ALT 17  ALKPHOS 166*  BILITOT 0.6  PROT 7.5  ALBUMIN  3.8   No results for input(s): LIPASE, AMYLASE in the last 168 hours. No results for input(s): AMMONIA in the last 168 hours. Coagulation Profile: No results for input(s): INR, PROTIME in the last  168 hours. Cardiac Enzymes: Recent Labs  Lab 01/16/24 1849  CKTOTAL 267*   BNP (last 3 results) No results for input(s): PROBNP in the last 8760 hours. HbA1C: No results for input(s): HGBA1C in the last 72 hours. CBG: No results for input(s): GLUCAP in the last 168 hours. Lipid Profile: No results for input(s): CHOL, HDL, LDLCALC, TRIG, CHOLHDL, LDLDIRECT in the last 72 hours. Thyroid Function Tests: No results for input(s): TSH, T4TOTAL, FREET4, T3FREE, THYROIDAB in the last 72 hours. Anemia Panel: No results for input(s): VITAMINB12, FOLATE, FERRITIN, TIBC, IRON, RETICCTPCT in the last 72 hours. Urine analysis:    Component Value Date/Time   COLORURINE YELLOW (A) 11/04/2022 2250   APPEARANCEUR TURBID (A) 11/04/2022 2250   LABSPEC 1.010 11/04/2022 2250   PHURINE 5.0 11/04/2022 2250   GLUCOSEU NEGATIVE 11/04/2022 2250   HGBUR MODERATE (A) 11/04/2022 2250   BILIRUBINUR NEGATIVE 11/04/2022 2250   KETONESUR NEGATIVE 11/04/2022 2250  PROTEINUR 100 (A) 11/04/2022 2250   NITRITE NEGATIVE 11/04/2022 2250   LEUKOCYTESUR LARGE (A) 11/04/2022 2250   Sepsis Labs: @LABRCNTIP (procalcitonin:4,lacticidven:4)  )No results found for this or any previous visit (from the past 240 hours).       Radiology Studies: DG Chest 1 View Result Date: 01/16/2024 EXAM: 1 VIEW XRAY OF THE CHEST 01/16/2024 07:36:00 PM COMPARISON: 09/08/2023 CLINICAL HISTORY: 106001 Syncope 106001. Pt BIB EMS for elevated BUN/creatinine levels and syncopal episodes over the past week. Doctors office sent over for IV fluids. Pt BIB EMS for elevated BUN/creatinine levels and syncopal episodes over the past week. Doctors office sent over for IV fluids. FINDINGS: LUNGS AND PLEURA: No focal pulmonary opacity. No pulmonary edema. No pleural effusion. No pneumothorax. Chronic blunting of the left costophrenic angle, favoring epicardial fat when correlating with prior CT. HEART AND  MEDIASTINUM: No acute abnormality of the cardiac and mediastinal silhouettes. Thoracic aortic atherosclerosis. BONES AND SOFT TISSUES: No acute osseous abnormality. Cholecystectomy clips. IMPRESSION: 1. No acute findings. Electronically signed by: Pinkie Pebbles MD 01/16/2024 07:44 PM EDT RP Workstation: [id number]        Scheduled Meds:  aspirin  EC  81 mg Oral Daily   atorvastatin   40 mg Oral QHS   busPIRone   5 mg Oral BID   escitalopram   10 mg Oral Daily   heparin  injection (subcutaneous)  5,000 Units Subcutaneous Q8H   mirtazapine   7.5 mg Oral QHS   pantoprazole   40 mg Oral BID   prenatal multivitamin  1 tablet Oral Daily   tiZANidine   2 mg Oral QHS   Continuous Infusions:  sodium chloride  100 mL/hr at 01/17/24 9347     LOS: 1 day     Devaughn KATHEE Ban, MD [facility]   If 7PM-7AM, please contact night-coverage [email or web address] Password [email or web address] 01/17/2024, 8:46 AM

## 2024-01-17 NOTE — Assessment & Plan Note (Signed)
-   Will continue BuSpar  and trazodone  and Lexapro .

## 2024-01-18 DIAGNOSIS — N189 Chronic kidney disease, unspecified: Secondary | ICD-10-CM | POA: Diagnosis not present

## 2024-01-18 DIAGNOSIS — N179 Acute kidney failure, unspecified: Secondary | ICD-10-CM | POA: Diagnosis not present

## 2024-01-18 LAB — BASIC METABOLIC PANEL WITH GFR
Anion gap: 5 (ref 5–15)
BUN: 59 mg/dL — ABNORMAL HIGH (ref 8–23)
CO2: 20 mmol/L — ABNORMAL LOW (ref 22–32)
Calcium: 9.2 mg/dL (ref 8.9–10.3)
Chloride: 117 mmol/L — ABNORMAL HIGH (ref 98–111)
Creatinine, Ser: 2.98 mg/dL — ABNORMAL HIGH (ref 0.44–1.00)
GFR, Estimated: 15 mL/min — ABNORMAL LOW (ref >60)
Glucose, Bld: 73 mg/dL (ref 70–99)
Potassium: 4.3 mmol/L (ref 3.5–5.1)
Sodium: 142 mmol/L (ref 135–145)

## 2024-01-18 MED ORDER — DEXTROSE-SODIUM CHLORIDE 5-0.45 % IV SOLN: INTRAVENOUS | Status: AC

## 2024-01-18 NOTE — Progress Notes (Signed)
 PROGRESS NOTE    Kaitlin Wagner  FMW:[id number] DOB: 09-29-1945 DOA: 01/16/2024 PCP: Eleanore Ronal RAMAN, MD  Outpatient Specialists: none    Brief Narrative:   Kaitlin Wagner is a 78 y.o. female with history of CVA with right-sided weakness and contracture, dementia, hypertension, CKD, presenting with syncopal episodes in the past week as well as elevated BUN and creatinine.  With sent in by her doctor to get IV hydration.  She denies any being any symptoms.  No chest pain or shortness of breath, no new weakness or numbness.  She is not coughing or have any burning when she pees, no nausea vomiting or diarrhea.  No abdominal pain.   On independent chart review, she was admitted in March of this year, has chronic right sided weakness and contracture, presented with possible strokelike symptoms due to new left-sided weakness.  An outpatient MRI had showed a right ACA territory stroke that was subacute.  Was placed on aspirin  and Plavix  for 21 days and then resumed aspirin  monotherapy after.   Independent history obtained from primary care doctor, her number is [phone], [phone].  States that patient's baseline creatinine is 1.5, was diagnosed with a UTI 3 days ago, was given some IM ceftriaxone  and is now day 3 of amoxicillin  for which her urine cultures that grew back are susceptible to.  Has been having diarrhea last week, had some several episodes of nausea vomiting, none reported today.  Had had 2 syncopal episodes, one while in her lift and another 1 in her wheelchair.  No known falls or trauma.  Had noted labs today, her creatinine was 4.35 and BUN was 68.  Also noted that her Creacy CK was mildly elevated in the past.  Patient is DNR/DNI but okay to be admitted and to treat may needing reversible.   Assessment & Plan:   Principal Problem:   Acute kidney injury superimposed on chronic kidney disease (HCC) Active Problems:   Syncope, vasovagal   Dyslipidemia   Diabetes mellitus type 2,  uncomplicated (HCC)   Advanced dementia (HCC)   Essential (primary) hypertension   CKD stage 3b, GFR 30-44 ml/min (HCC)   Stroke (cerebrum) (HCC)   Anxiety and depression   GERD without esophagitis  # AKI on ckd 3b # Metabolic acidosis Appears to be pre-renal. Advanced dementia may be driving this though apparently recent GI illness. Kidney function improving with fluids. Baseline cr is around 1.5. today 2.98, was 4.72 on arrival. - continue IVF  # Recent diarrhea Loose stool here, c diff and gi pathogen panel neg. Denies pain  # Rhinorrhea Per family. No cough or hypoxia. Respiratory panel neg, cxr nothing acute.  # Recent UTI EDP spoke with PCP who ways patient recently treated for uti and abx were transitioned to amoxicillin  based on sensitivities. Apparently received IM ceftriaxone  x1 and as of yesterday was on day 3 of amoxicillin  - f/u culture  # Advanced dementia # GAD PACE participant with [facility]. No behavior disturbance - home buspar , lexapro , mirtazapine   # T2DM Diet controlled, euglycemic  # HTN Mild/mod elevations here. - home telmisartan on hold 2/2 aki, would substitute for a ccb at d/c  # Dysphagia Daughter says currently on pureed diet - dysphagia 1 diet  # History CVA - cont home asa, statin  # Goals of care Daughter considering transition to hospice upon discharge, is a PACE participant    DVT prophylaxis: heparin  Code Status: dnr/dni Family Communication: daughter updated telephonically 7/20  Level  of care: Telemetry Medical Status is: Inpatient Remains inpatient appropriate because: severity of illness    Consultants:  none  Procedures: none  Antimicrobials:  none    Subjective: No complaints, denies pain  Objective: Vitals:   01/17/24 1515 01/17/24 1958 01/18/24 0406 01/18/24 0739  BP: (!) 149/66 123/73 (!) 142/70 (!) 167/66  Pulse: 78 79 71 71  Resp: 18 16 16 14   Temp: 97.7 F (36.5 C) 98.3 F (36.8 C)  97.8 F (36.6 C) (!) 97 F (36.1 C)  TempSrc: Oral Oral Oral   SpO2: 100% 99% 100% 99%  Weight:      Height:        Intake/Output Summary (Last 24 hours) at 01/18/2024 1408 Last data filed at 01/18/2024 0100 Gross per 24 hour  Intake 758.97 ml  Output --  Net 758.97 ml   Filed Weights   01/16/24 1820  Weight: 65 kg    Examination:  General exam: Appears calm and comfortable. Chronically ill Respiratory system: Clear to auscultation save for bibasilar rales Cardiovascular system: S1 & S2 heard, RRR. Soft systolic murmur Gastrointestinal system: Abdomen is nondistended, soft and nontender. Central nervous system: Alert, not oriented Extremities: cool Skin: ulcer left heel Psychiatry: pleasantly demented    Data Reviewed: I have personally reviewed following labs and imaging studies  CBC: Recent Labs  Lab 01/16/24 1849 01/17/24 0339  WBC 7.8 7.2  NEUTROABS 6.5  --   HGB 11.6* 11.0*  HCT 36.7 34.9*  MCV 86.4 86.6  PLT 239 223   Basic Metabolic Panel: Recent Labs  Lab 01/16/24 1849 01/17/24 0339 01/18/24 0420  NA 139 141 142  K 4.5 4.3 4.3  CL 111 115* 117*  CO2 16* 19* 20*  GLUCOSE 116* 79 73  BUN 80* 71* 59*  CREATININE 4.72* 3.95* 2.98*  CALCIUM  9.6 9.2 9.2   GFR: Estimated Creatinine Clearance: 13.2 mL/min (A) (by C-G formula based on SCr of 2.98 mg/dL (H)). Liver Function Tests: Recent Labs  Lab 01/16/24 1849  AST 19  ALT 17  ALKPHOS 166*  BILITOT 0.6  PROT 7.5  ALBUMIN  3.8   No results for input(s): LIPASE, AMYLASE in the last 168 hours. No results for input(s): AMMONIA in the last 168 hours. Coagulation Profile: No results for input(s): INR, PROTIME in the last 168 hours. Cardiac Enzymes: Recent Labs  Lab 01/16/24 1849  CKTOTAL 267*   BNP (last 3 results) No results for input(s): PROBNP in the last 8760 hours. HbA1C: No results for input(s): HGBA1C in the last 72 hours. CBG: No results for input(s): GLUCAP in  the last 168 hours. Lipid Profile: No results for input(s): CHOL, HDL, LDLCALC, TRIG, CHOLHDL, LDLDIRECT in the last 72 hours. Thyroid Function Tests: No results for input(s): TSH, T4TOTAL, FREET4, T3FREE, THYROIDAB in the last 72 hours. Anemia Panel: No results for input(s): VITAMINB12, FOLATE, FERRITIN, TIBC, IRON, RETICCTPCT in the last 72 hours. Urine analysis:    Component Value Date/Time   COLORURINE YELLOW (A) 01/17/2024 1114   APPEARANCEUR HAZY (A) 01/17/2024 1114   LABSPEC 1.012 01/17/2024 1114   PHURINE 5.0 01/17/2024 1114   GLUCOSEU NEGATIVE 01/17/2024 1114   HGBUR SMALL (A) 01/17/2024 1114   BILIRUBINUR NEGATIVE 01/17/2024 1114   KETONESUR NEGATIVE 01/17/2024 1114   PROTEINUR NEGATIVE 01/17/2024 1114   NITRITE NEGATIVE 01/17/2024 1114   LEUKOCYTESUR MODERATE (A) 01/17/2024 1114   Sepsis Labs: @LABRCNTIP (procalcitonin:4,lacticidven:4)  ) Recent Results (from the past 240 hours)  Culture, blood (Routine X 2) w Reflex  to ID Panel     Status: None (Preliminary result)   Collection Time: 01/17/24 10:17 AM   Specimen: BLOOD  Result Value Ref Range Status   Specimen Description BLOOD BLOOD RIGHT HAND  Final   Special Requests   Final    BOTTLES DRAWN AEROBIC AND ANAEROBIC Blood Culture results may not be optimal due to an inadequate volume of blood received in culture bottles   Culture   Final    NO GROWTH < 24 HOURS Performed at [facility], [address], [place], [place] [address]    Report Status PENDING  Incomplete  Culture, blood (Routine X 2) w Reflex to ID Panel     Status: None (Preliminary result)   Collection Time: 01/17/24 10:17 AM   Specimen: BLOOD  Result Value Ref Range Status   Specimen Description BLOOD BLOOD LEFT FOREARM  Final   Special Requests   Final    BOTTLES DRAWN AEROBIC ONLY Blood Culture results may not be optimal due to an inadequate volume of blood received in culture bottles   Culture    Final    NO GROWTH < 24 HOURS Performed at [facility], [address] [place], [place] [address]    Report Status PENDING  Incomplete  Respiratory (~20 pathogens) panel by PCR     Status: None   Collection Time: 01/17/24 11:15 AM   Specimen: Nasopharyngeal Swab; Respiratory  Result Value Ref Range Status   Adenovirus NOT DETECTED NOT DETECTED Final   Coronavirus 229E NOT DETECTED NOT DETECTED Final    Comment: (NOTE) The Coronavirus on the Respiratory Panel, DOES NOT test for the novel  Coronavirus (2019 nCoV)    Coronavirus HKU1 NOT DETECTED NOT DETECTED Final   Coronavirus NL63 NOT DETECTED NOT DETECTED Final   Coronavirus OC43 NOT DETECTED NOT DETECTED Final   Metapneumovirus NOT DETECTED NOT DETECTED Final   Rhinovirus / Enterovirus NOT DETECTED NOT DETECTED Final   Influenza A NOT DETECTED NOT DETECTED Final   Influenza B NOT DETECTED NOT DETECTED Final   Parainfluenza Virus 1 NOT DETECTED NOT DETECTED Final   Parainfluenza Virus 2 NOT DETECTED NOT DETECTED Final   Parainfluenza Virus 3 NOT DETECTED NOT DETECTED Final   Parainfluenza Virus 4 NOT DETECTED NOT DETECTED Final   Respiratory Syncytial Virus NOT DETECTED NOT DETECTED Final   Bordetella pertussis NOT DETECTED NOT DETECTED Final   Bordetella Parapertussis NOT DETECTED NOT DETECTED Final   Chlamydophila pneumoniae NOT DETECTED NOT DETECTED Final   Mycoplasma pneumoniae NOT DETECTED NOT DETECTED Final    Comment: Performed at [facility], [address], [place], [place] [address]  SARS Coronavirus 2 by RT PCR (hospital order, performed in [facility] hospital lab) *cepheid single result test* Anterior Nasal Swab     Status: None   Collection Time: 01/17/24 11:15 AM   Specimen: Anterior Nasal Swab  Result Value Ref Range Status   SARS Coronavirus 2 by RT PCR NEGATIVE NEGATIVE Final    Comment: (NOTE) SARS-CoV-2 target nucleic acids are NOT DETECTED.  The SARS-CoV-2 RNA is generally detectable in  upper and lower respiratory specimens during the acute phase of infection. The lowest concentration of SARS-CoV-2 viral copies this assay can detect is 250 copies / mL. A negative result does not preclude SARS-CoV-2 infection and should not be used as the sole basis for treatment or other patient management decisions.  A negative result may occur with improper specimen collection / handling, submission of specimen  other than nasopharyngeal swab, presence of viral mutation(s) within the areas targeted by this assay, and inadequate number of viral copies (<250 copies / mL). A negative result must be combined with clinical observations, patient history, and epidemiological information.  Fact Sheet for Patients:   RoadLapTop.co.za  Fact Sheet for Healthcare Providers: http://kim-miller.com/  This test is not yet approved or  cleared by the [place]  FDA and has been authorized for detection and/or diagnosis of SARS-CoV-2 by FDA under an Emergency Use Authorization (EUA).  This EUA will remain in effect (meaning this test can be used) for the duration of the COVID-19 declaration under Section 564(b)(1) of the Act, 21 U.S.C. section 360bbb-3(b)(1), unless the authorization is terminated or revoked sooner.  Performed at [facility], [address] [place], [place] [address]   C Difficile Quick Screen w PCR reflex     Status: None   Collection Time: 01/17/24  3:39 PM   Specimen: STOOL  Result Value Ref Range Status   C Diff antigen NEGATIVE NEGATIVE Final   C Diff toxin NEGATIVE NEGATIVE Final   C Diff interpretation No C. difficile detected.  Final    Comment: Performed at [facility], [address] [place], [place] [address]  Gastrointestinal Panel by PCR , Stool     Status: None   Collection Time: 01/17/24  3:39 PM   Specimen: Stool  Result Value Ref Range Status   Campylobacter species NOT DETECTED NOT  DETECTED Final   Plesimonas shigelloides NOT DETECTED NOT DETECTED Final   Salmonella species NOT DETECTED NOT DETECTED Final   Yersinia enterocolitica NOT DETECTED NOT DETECTED Final   Vibrio species NOT DETECTED NOT DETECTED Final   Vibrio cholerae NOT DETECTED NOT DETECTED Final   Enteroaggregative E coli (EAEC) NOT DETECTED NOT DETECTED Final   Enteropathogenic E coli (EPEC) NOT DETECTED NOT DETECTED Final   Enterotoxigenic E coli (ETEC) NOT DETECTED NOT DETECTED Final   Shiga like toxin producing E coli (STEC) NOT DETECTED NOT DETECTED Final   Shigella/Enteroinvasive E coli (EIEC) NOT DETECTED NOT DETECTED Final   Cryptosporidium NOT DETECTED NOT DETECTED Final   Cyclospora cayetanensis NOT DETECTED NOT DETECTED Final   Entamoeba histolytica NOT DETECTED NOT DETECTED Final   Giardia lamblia NOT DETECTED NOT DETECTED Final   Adenovirus F40/41 NOT DETECTED NOT DETECTED Final   Astrovirus NOT DETECTED NOT DETECTED Final   Norovirus GI/GII NOT DETECTED NOT DETECTED Final   Rotavirus A NOT DETECTED NOT DETECTED Final   Sapovirus (I, II, IV, and V) NOT DETECTED NOT DETECTED Final    Comment: Performed at [facility], [address], [place], [place] [address]         Radiology Studies: DG Chest 1 View Result Date: 01/16/2024 EXAM: 1 VIEW XRAY OF THE CHEST 01/16/2024 07:36:00 PM COMPARISON: 09/08/2023 CLINICAL HISTORY: 106001 Syncope 106001. Pt BIB EMS for elevated BUN/creatinine levels and syncopal episodes over the past week. Doctors office sent over for IV fluids. Pt BIB EMS for elevated BUN/creatinine levels and syncopal episodes over the past week. Doctors office sent over for IV fluids. FINDINGS: LUNGS AND PLEURA: No focal pulmonary opacity. No pulmonary edema. No pleural effusion. No pneumothorax. Chronic blunting of the left costophrenic angle, favoring epicardial fat when correlating with prior CT. HEART AND MEDIASTINUM: No acute abnormality of the cardiac and  mediastinal silhouettes. Thoracic aortic atherosclerosis. BONES AND SOFT TISSUES: No acute osseous abnormality. Cholecystectomy clips. IMPRESSION: 1. No acute findings. Electronically signed by: Sriyesh Krishnan  MD 01/16/2024 07:44 PM EDT RP Workstation: [id number]        Scheduled Meds:  aspirin  EC  81 mg Oral Daily   atorvastatin   40 mg Oral QHS   busPIRone   5 mg Oral BID   escitalopram   10 mg Oral Daily   heparin  injection (subcutaneous)  5,000 Units Subcutaneous Q8H   mirtazapine   7.5 mg Oral QHS   pantoprazole   40 mg Oral BID   prenatal multivitamin  1 tablet Oral Daily   tiZANidine   2 mg Oral QHS   Continuous Infusions:     LOS: 2 days     Devaughn KATHEE Ban, MD [facility]   If 7PM-7AM, please contact night-coverage [email or web address] Password [email or web address] 01/18/2024, 2:08 PM

## 2024-01-18 NOTE — Plan of Care (Signed)

## 2024-01-19 DIAGNOSIS — N179 Acute kidney failure, unspecified: Secondary | ICD-10-CM | POA: Diagnosis not present

## 2024-01-19 DIAGNOSIS — N189 Chronic kidney disease, unspecified: Secondary | ICD-10-CM | POA: Diagnosis not present

## 2024-01-19 LAB — BASIC METABOLIC PANEL WITH GFR
Anion gap: 8 (ref 5–15)
BUN: 45 mg/dL — ABNORMAL HIGH (ref 8–23)
CO2: 21 mmol/L — ABNORMAL LOW (ref 22–32)
Calcium: 9.5 mg/dL (ref 8.9–10.3)
Chloride: 113 mmol/L — ABNORMAL HIGH (ref 98–111)
Creatinine, Ser: 2.35 mg/dL — ABNORMAL HIGH (ref 0.44–1.00)
GFR, Estimated: 21 mL/min — ABNORMAL LOW (ref >60)
Glucose, Bld: 93 mg/dL (ref 70–99)
Potassium: 3.8 mmol/L (ref 3.5–5.1)
Sodium: 142 mmol/L (ref 135–145)

## 2024-01-19 LAB — URINE CULTURE: Culture: NO GROWTH

## 2024-01-19 MED ORDER — DEXTROSE-SODIUM CHLORIDE 5-0.45 % IV SOLN: INTRAVENOUS | Status: DC

## 2024-01-19 NOTE — Plan of Care (Signed)

## 2024-01-19 NOTE — TOC Transition Note (Addendum)
 Transition of Care [facility]) - Discharge Note   Patient Details  Name: Kaitlin Wagner MRN: [id number] Date of Birth: 1945-12-28  [facility]) CM/SW Contact:  [name], RN Phone Number: 01/19/2024, 12:18 PM   Clinical Narrative:     Patient to discharge today MD has notified daughter Per [name] with [facility] they will arrange transport around 1 pm.  They will take patient to the center to be evaluated by her MD, prior to returning patient home    Update: patient will now transport via [facility].  Daughter [name] confirms that daughter [name] will be at home to accept patient      Patient Goals and CMS Choice            Discharge Placement                       Discharge Plan and Services Additional resources added to the After Visit Summary for                                       Social Drivers of Health (SDOH) Interventions SDOH Screenings   Food Insecurity: No Food Insecurity (01/17/2024)  Housing: Low Risk  (01/17/2024)  Transportation Needs: No Transportation Needs (01/17/2024)  Utilities: Not At Risk (01/17/2024)  Social Connections: Socially Isolated (01/17/2024)  Tobacco Use: Low Risk  (01/16/2024)     Readmission Risk Interventions     No data to display

## 2024-01-19 NOTE — Discharge Summary (Addendum)
 Kaitlin Wagner FMW:[id number] DOB: 10-09-1945 DOA: 01/16/2024  PCP: Eleanore Ronal RAMAN, MD  Admit date: 01/16/2024 Discharge date: 01/19/2024  Time spent: 35 minutes  Recommendations for Outpatient Follow-up:  Close f/u with [facility] program Repeat bmp in one week     Discharge Diagnoses:  Principal Problem:   Acute kidney injury superimposed on chronic kidney disease (HCC) Active Problems:   Syncope, vasovagal   Dyslipidemia   Diabetes mellitus type 2, uncomplicated (HCC)   Advanced dementia (HCC)   Essential (primary) hypertension   CKD stage 3b, GFR 30-44 ml/min (HCC)   Stroke (cerebrum) (HCC)   Anxiety and depression   GERD without esophagitis   Discharge Condition: stable  Diet recommendation: dysphagia 1  Filed Weights   01/16/24 1820  Weight: 65 kg    History of present illness:  From admission h and p Kaitlin Wagner is a 78 y.o. female with medical history significant for type 2 diabetes mellitus, hypertension dyslipidemia  CVA and right-sided weakness with contractures, dementia and GERD, presented to the emergency room with acute onset of altered mental status with syncopal episodes for the past week and elevated BUN and creatinine.  The patient is a very poor historian and basically nonverbal.  No reported chest pain or palpitations.  No reported cough or wheezing.  No reported abdominal pain or melena or bright red bleeding per rectum.  The patient's baseline creatinine is 1.5 and she had a UTI 3 days ago per her PCP who was called today.  She was given IM Rocephin  and is now on day 3 of amoxicillin  after results of urine culture.  The patient is been having diarrhea last week and had some episodes of nausea and vomiting reportedly today.  She had 2 syncopal episodes 1 while in her left and another 1 in her wheelchair.  Creatinine was up to 4.35 with a BUN of 68 and her CK was mildly elevated.    Hospital Course:   # AKI on ckd 3b # Metabolic acidosis Appears to be  pre-renal. Advanced dementia may be driving this though apparently recent GI illness. Kidney function improving with fluids. Baseline cr is around 1.5. cr 4.72 on arrival improved to 2.35 today without significant acid/base or electrolyte disturbance. Case reviewed with [facility] PCP on day of discharge. - push fluids and repeat bmp in 1 week   # Recent diarrhea Loose stool here, c diff and gi pathogen panel neg. Denies pain   # Rhinorrhea Per family. No cough or hypoxia. Respiratory panel neg, cxr nothing acute.   # Recent UTI EDP spoke with PCP who ways patient recently treated for uti and abx were transitioned to amoxicillin  based on sensitivities. Apparently received IM ceftriaxone  x1 and as of yesterday was on day 3 of amoxicillin . Here urine culture negative, did not receive antibiotics   # Advanced dementia # GAD [facility] participant with [facility]. No behavior disturbance - home buspar , lexapro , mirtazapine    # T2DM Diet controlled, euglycemic   # HTN Mild/mod elevations here. - home telmisartan on hold 2/2 aki - pcp f/u    # Dysphagia Daughter says currently on pureed diet at home   # History CVA - cont home asa, statin   # Goals of care Daughter considering transition to hospice upon discharge, is a [facility] participant  Procedures: none   Consultations: none  Discharge Exam: Vitals:   01/19/24 0716 01/19/24 0758  BP: (!) 181/67 (!) 159/76  Pulse: 79 63  Resp: 16  Temp: 98 F (36.7 C)   SpO2: 99%     General exam: Appears calm and comfortable. Chronically ill Respiratory system: Clear to auscultation save for bibasilar rales Cardiovascular system: S1 & S2 heard, RRR. Soft systolic murmur Gastrointestinal system: Abdomen is nondistended, soft and nontender. Central nervous system: Alert, not oriented Extremities: cool Skin: ulcer left heel Psychiatry: pleasantly demented  Discharge Instructions   Discharge Instructions     Diet general    Complete by: As directed    Dysphagia diet   Increase activity slowly   Complete by: As directed       Allergies as of 01/19/2024   No Known Allergies      Medication List     PAUSE taking these medications    telmisartan 20 MG tablet Wait to take this until your doctor or other care provider tells you to start again. Commonly known as: MICARDIS Take 20 mg by mouth daily.       STOP taking these medications    amoxicillin -clavulanate 875-125 MG tablet Commonly known as: AUGMENTIN    cefTRIAXone  1 g injection Commonly known as: ROCEPHIN        TAKE these medications    Acetaminophen  Childrens 160 MG/5ML Soln Take 20 mLs by mouth 3 (three) times daily as needed for pain.   Anti-Itch lotion Generic drug: camphor-menthol Apply 1 Application topically as needed.   aspirin  EC 81 MG tablet Take 81 mg by mouth daily.   atorvastatin  40 MG tablet Commonly known as: LIPITOR Take 1 tablet (40 mg total) by mouth at bedtime.   busPIRone  5 MG tablet Commonly known as: BUSPAR  Take 5 mg by mouth 2 (two) times daily.   escitalopram  10 MG tablet Commonly known as: LEXAPRO  Take 10 mg by mouth daily.   esomeprazole  10 MG packet Commonly known as: NEXIUM  Take 10 mg by mouth 2 (two) times daily.   mirtazapine  7.5 MG tablet Commonly known as: REMERON  Take 1 tablet (7.5 mg total) by mouth at bedtime.   PrePLUS 27-1 MG Tabs Take 1 tablet by mouth daily.   tiZANidine  2 MG tablet Commonly known as: ZANAFLEX  Take 2 mg by mouth at bedtime.       No Known Allergies  Follow-up Information     your [facility] program Follow up.                   The results of significant diagnostics from this hospitalization (including imaging, microbiology, ancillary and laboratory) are listed below for reference.    Significant Diagnostic Studies: DG Chest 1 View Result Date: 01/16/2024 EXAM: 1 VIEW XRAY OF THE CHEST 01/16/2024 07:36:00 PM COMPARISON: 09/08/2023 CLINICAL  HISTORY: 106001 Syncope 106001. Pt BIB EMS for elevated BUN/creatinine levels and syncopal episodes over the past week. Doctors office sent over for IV fluids. Pt BIB EMS for elevated BUN/creatinine levels and syncopal episodes over the past week. Doctors office sent over for IV fluids. FINDINGS: LUNGS AND PLEURA: No focal pulmonary opacity. No pulmonary edema. No pleural effusion. No pneumothorax. Chronic blunting of the left costophrenic angle, favoring epicardial fat when correlating with prior CT. HEART AND MEDIASTINUM: No acute abnormality of the cardiac and mediastinal silhouettes. Thoracic aortic atherosclerosis. BONES AND SOFT TISSUES: No acute osseous abnormality. Cholecystectomy clips. IMPRESSION: 1. No acute findings. Electronically signed by: Pinkie Pebbles MD 01/16/2024 07:44 PM EDT RP Workstation: [id number]    Microbiology: Recent Results (from the past 240 hours)  Culture, blood (Routine X 2) w Reflex to ID Panel  Status: None (Preliminary result)   Collection Time: 01/17/24 10:17 AM   Specimen: BLOOD  Result Value Ref Range Status   Specimen Description BLOOD BLOOD RIGHT HAND  Final   Special Requests   Final    BOTTLES DRAWN AEROBIC AND ANAEROBIC Blood Culture results may not be optimal due to an inadequate volume of blood received in culture bottles   Culture   Final    NO GROWTH 2 DAYS Performed at [facility], [address] [place], [place] [address]    Report Status PENDING  Incomplete  Culture, blood (Routine X 2) w Reflex to ID Panel     Status: None (Preliminary result)   Collection Time: 01/17/24 10:17 AM   Specimen: BLOOD  Result Value Ref Range Status   Specimen Description BLOOD BLOOD LEFT FOREARM  Final   Special Requests   Final    BOTTLES DRAWN AEROBIC ONLY Blood Culture results may not be optimal due to an inadequate volume of blood received in culture bottles   Culture   Final    NO GROWTH 2 DAYS Performed at [facility], [address] [place], [place] [address]    Report Status PENDING  Incomplete  Urine Culture     Status: None   Collection Time: 01/17/24 11:14 AM   Specimen: Urine, Random  Result Value Ref Range Status   Specimen Description   Final    URINE, RANDOM Performed at [facility], [address], [place], [place] [address]    Special Requests   Final    NONE Reflexed from [id number] Performed at [facility], [address] [place], [place] [address]    Culture   Final    NO GROWTH Performed at [facility], [address] [place], [place] [address]    Report Status 01/19/2024 FINAL  Final  Respiratory (~20 pathogens) panel by PCR     Status: None   Collection Time: 01/17/24 11:15 AM   Specimen: Nasopharyngeal Swab; Respiratory  Result Value Ref Range Status   Adenovirus NOT DETECTED NOT DETECTED Final   Coronavirus 229E NOT DETECTED NOT DETECTED Final    Comment: (NOTE) The Coronavirus on the Respiratory Panel, DOES NOT test for the novel  Coronavirus (2019 nCoV)    Coronavirus HKU1 NOT DETECTED NOT DETECTED Final   Coronavirus NL63 NOT DETECTED NOT DETECTED Final   Coronavirus OC43 NOT DETECTED NOT DETECTED Final   Metapneumovirus NOT DETECTED NOT DETECTED Final   Rhinovirus / Enterovirus NOT DETECTED NOT DETECTED Final   Influenza A NOT DETECTED NOT DETECTED Final   Influenza B NOT DETECTED NOT DETECTED Final   Parainfluenza Virus 1 NOT DETECTED NOT DETECTED Final   Parainfluenza Virus 2 NOT DETECTED NOT DETECTED Final   Parainfluenza Virus 3 NOT DETECTED NOT DETECTED Final   Parainfluenza Virus 4 NOT DETECTED NOT DETECTED Final   Respiratory Syncytial Virus NOT DETECTED NOT DETECTED Final   Bordetella pertussis NOT DETECTED NOT DETECTED Final   Bordetella Parapertussis NOT DETECTED NOT DETECTED Final   Chlamydophila pneumoniae NOT DETECTED NOT DETECTED Final   Mycoplasma pneumoniae NOT DETECTED NOT DETECTED Final    Comment: Performed at [facility], [address] [place], [place] [address]  SARS Coronavirus 2 by RT PCR (hospital order, performed in [facility] hospital lab) *cepheid single result test* Anterior Nasal Swab     Status: None   Collection Time: 01/17/24 11:15 AM   Specimen: Anterior Nasal Swab  Result Value Ref Range Status   SARS Coronavirus 2 by RT PCR NEGATIVE NEGATIVE Final    Comment: (NOTE) SARS-CoV-2 target nucleic acids are NOT DETECTED.  The SARS-CoV-2 RNA is generally detectable in upper and lower respiratory specimens during the acute phase of infection. The lowest concentration of SARS-CoV-2 viral copies this assay can detect is 250 copies / mL. A negative result does not preclude SARS-CoV-2 infection and should not be used as the sole basis for treatment or other patient management decisions.  A negative result may occur with improper specimen collection / handling, submission of specimen other than nasopharyngeal swab, presence of viral mutation(s) within the areas targeted by this assay, and inadequate number of viral copies (<250 copies / mL). A negative result must be combined with clinical observations, patient history, and epidemiological information.  Fact Sheet for Patients:   RoadLapTop.co.za  Fact Sheet for Healthcare Providers: http://kim-miller.com/  This test is not yet approved or  cleared by the [place]  FDA and has been authorized for detection and/or diagnosis of SARS-CoV-2 by FDA under an Emergency Use Authorization (EUA).  This EUA will remain in effect (meaning this test can be used) for the duration of the COVID-19 declaration under Section 564(b)(1) of the Act, 21 U.S.C. section 360bbb-3(b)(1), unless the authorization is terminated or revoked sooner.  Performed at [facility], [address] [place], [place] [address]   C Difficile Quick Screen w PCR reflex     Status: None   Collection Time: 01/17/24   3:39 PM   Specimen: STOOL  Result Value Ref Range Status   C Diff antigen NEGATIVE NEGATIVE Final   C Diff toxin NEGATIVE NEGATIVE Final   C Diff interpretation No C. difficile detected.  Final    Comment: Performed at [facility], [address] [place], [place] [address]  Gastrointestinal Panel by PCR , Stool     Status: None   Collection Time: 01/17/24  3:39 PM   Specimen: Stool  Result Value Ref Range Status   Campylobacter species NOT DETECTED NOT DETECTED Final   Plesimonas shigelloides NOT DETECTED NOT DETECTED Final   Salmonella species NOT DETECTED NOT DETECTED Final   Yersinia enterocolitica NOT DETECTED NOT DETECTED Final   Vibrio species NOT DETECTED NOT DETECTED Final   Vibrio cholerae NOT DETECTED NOT DETECTED Final   Enteroaggregative E coli (EAEC) NOT DETECTED NOT DETECTED Final   Enteropathogenic E coli (EPEC) NOT DETECTED NOT DETECTED Final   Enterotoxigenic E coli (ETEC) NOT DETECTED NOT DETECTED Final   Shiga like toxin producing E coli (STEC) NOT DETECTED NOT DETECTED Final   Shigella/Enteroinvasive E coli (EIEC) NOT DETECTED NOT DETECTED Final   Cryptosporidium NOT DETECTED NOT DETECTED Final   Cyclospora cayetanensis NOT DETECTED NOT DETECTED Final   Entamoeba histolytica NOT DETECTED NOT DETECTED Final   Giardia lamblia NOT DETECTED NOT DETECTED Final   Adenovirus F40/41 NOT DETECTED NOT DETECTED Final   Astrovirus NOT DETECTED NOT DETECTED Final   Norovirus GI/GII NOT DETECTED NOT DETECTED Final   Rotavirus A NOT DETECTED NOT DETECTED Final   Sapovirus (I, II, IV, and V) NOT DETECTED NOT DETECTED Final    Comment: Performed at [facility], [address], [place], [place] [address]     Labs: Basic Metabolic Panel: Recent Labs  Lab 01/16/24 1849 01/17/24 0339 01/18/24 0420 01/19/24 0310  NA 139 141 142 142  K 4.5 4.3 4.3 3.8  CL 111 115* 117* 113*  CO2 16*  19* 20* 21*  GLUCOSE 116* 79 73 93  BUN 80* 71* 59* 45*  CREATININE  4.72* 3.95* 2.98* 2.35*  CALCIUM  9.6 9.2 9.2 9.5   Liver Function Tests: Recent Labs  Lab 01/16/24 1849  AST 19  ALT 17  ALKPHOS 166*  BILITOT 0.6  PROT 7.5  ALBUMIN  3.8   No results for input(s): LIPASE, AMYLASE in the last 168 hours. No results for input(s): AMMONIA in the last 168 hours. CBC: Recent Labs  Lab 01/16/24 1849 01/17/24 0339  WBC 7.8 7.2  NEUTROABS 6.5  --   HGB 11.6* 11.0*  HCT 36.7 34.9*  MCV 86.4 86.6  PLT 239 223   Cardiac Enzymes: Recent Labs  Lab 01/16/24 1849  CKTOTAL 267*   BNP: BNP (last 3 results) No results for input(s): BNP in the last 8760 hours.  ProBNP (last 3 results) No results for input(s): PROBNP in the last 8760 hours.  CBG: No results for input(s): GLUCAP in the last 168 hours.     Signed:  Devaughn KATHEE Ban MD.  [facility] 01/19/2024, 11:34 AM

## 2024-01-19 NOTE — TOC Initial Note (Signed)
 Transition of Care [facility]) - Initial/Assessment Note    Patient Details  Name: Kaitlin Wagner MRN: [id number] Date of Birth: [birth year]  Transition of [facility]) CM/SW Contact:    [name], RN Phone Number: 01/19/2024, 10:10 AM  Clinical Narrative:                  Noted patient is a [facility] patient.  VM left for Stacy RN with [facility] to review         Patient Goals and CMS Choice            Expected Discharge Plan and Services                                              Prior Living Arrangements/Services                       Activities of Daily Living   ADL Screening (condition at time of admission) Independently performs ADLs?: No Does the patient have a NEW difficulty with bathing/dressing/toileting/self-feeding that is expected to last >3 days?: No Does the patient have a NEW difficulty with getting in/out of bed, walking, or climbing stairs that is expected to last >3 days?: No Does the patient have a NEW difficulty with communication that is expected to last >3 days?: No Is the patient deaf or have difficulty hearing?: No Does the patient have difficulty seeing, even when wearing glasses/contacts?: No Does the patient have difficulty concentrating, remembering, or making decisions?: Yes  Permission Sought/Granted                  Emotional Assessment              Admission diagnosis:  Dehydration [E86.0] AKI (acute kidney injury) (HCC) [N17.9] Acute kidney injury superimposed on chronic kidney disease (HCC) [N17.9, N18.9] Patient Active Problem List   Diagnosis Date Noted   Syncope, vasovagal 01/17/2024   Dyslipidemia 01/17/2024   Anxiety and depression 01/17/2024   GERD without esophagitis 01/17/2024   Acute CVA (cerebrovascular accident) (HCC) 09/09/2023   Stroke (cerebrum) (HCC) 09/08/2023   Stroke (HCC) 09/08/2023   Intractable diarrhea 11/05/2022   Metabolic acidosis 11/05/2022   Urinary tract infection with  hematuria 11/05/2022   Hyperlipidemia 11/05/2022   AKI (acute kidney injury) (HCC) 11/05/2022   Acute kidney injury superimposed on chronic kidney disease (HCC) 11/04/2022   Acute gastroenteritis 11/04/2022   Diabetes mellitus type 2, uncomplicated (HCC) 11/04/2022   Sepsis due to gram-negative UTI (HCC) 11/04/2022   Thrombocytopenia (HCC) 08/05/2021   Aortic atherosclerosis (HCC) 08/05/2021   Pressure injury of right buttock, stage 2 (HCC) 08/01/2021   CKD stage 3b, GFR 30-44 ml/min (HCC) 08/01/2021   Protein-calorie malnutrition, severe 08/01/2021   Distended abdomen 08/01/2021   Uremia 08/01/2021   Acute renal failure (ARF) (HCC) 07/31/2021   DNR (do not resuscitate)/DNI(Do Not Intubate) 07/31/2021   Depression    Advanced dementia (HCC) 10/20/2020   GERD (gastroesophageal reflux disease) 10/20/2020   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 [place] [address] Phone: [phone] Fax: [phone]     Social Drivers of Health (SDOH) Social History: SDOH Screenings   Food Insecurity: No Food Insecurity (01/17/2024)  Housing: Low Risk  (01/17/2024)  Transportation Needs: No Transportation Needs (01/17/2024)  Utilities: Not At Risk (01/17/2024)  Social Connections: Socially Isolated (01/17/2024)  Tobacco Use: Low Risk  (01/16/2024)   SDOH Interventions:     Readmission Risk  Interventions     No data to display

## 2024-01-19 NOTE — Progress Notes (Signed)
 PROGRESS NOTE    Kaitlin Wagner  FMW:[id number] DOB: 02/23/1946 DOA: 01/16/2024 PCP: Eleanore Ronal RAMAN, MD  Outpatient Specialists: none    Brief Narrative:   Kaitlin Wagner is a 78 y.o. female with history of CVA with right-sided weakness and contracture, dementia, hypertension, CKD, presenting with syncopal episodes in the past week as well as elevated BUN and creatinine.  With sent in by her doctor to get IV hydration.  She denies any being any symptoms.  No chest pain or shortness of breath, no new weakness or numbness.  She is not coughing or have any burning when she pees, no nausea vomiting or diarrhea.  No abdominal pain.   On independent chart review, she was admitted in March of this year, has chronic right sided weakness and contracture, presented with possible strokelike symptoms due to new left-sided weakness.  An outpatient MRI had showed a right ACA territory stroke that was subacute.  Was placed on aspirin  and Plavix  for 21 days and then resumed aspirin  monotherapy after.   Independent history obtained from primary care doctor, her number is [phone], [phone].  States that patient's baseline creatinine is 1.5, was diagnosed with a UTI 3 days ago, was given some IM ceftriaxone  and is now day 3 of amoxicillin  for which her urine cultures that grew back are susceptible to.  Has been having diarrhea last week, had some several episodes of nausea vomiting, none reported today.  Had had 2 syncopal episodes, one while in her lift and another 1 in her wheelchair.  No known falls or trauma.  Had noted labs today, her creatinine was 4.35 and BUN was 68.  Also noted that her Creacy CK was mildly elevated in the past.  Patient is DNR/DNI but okay to be admitted and to treat may needing reversible.   Assessment & Plan:   Principal Problem:   Acute kidney injury superimposed on chronic kidney disease (HCC) Active Problems:   Syncope, vasovagal   Dyslipidemia   Diabetes mellitus type 2,  uncomplicated (HCC)   Advanced dementia (HCC)   Essential (primary) hypertension   CKD stage 3b, GFR 30-44 ml/min (HCC)   Stroke (cerebrum) (HCC)   Anxiety and depression   GERD without esophagitis  # Discharge barrier Plan for discharge today after discussing with daughter and [facility] MD today. However since then no one (neither us  nor [facility]) have been able to get in touch with either daughter regarding discharge. If we are unable to contact them today, will need to hold on discharge and try again tomorrow  # AKI on ckd 3b # Metabolic acidosis Appears to be pre-renal. Advanced dementia may be driving this though apparently recent GI illness. Kidney function improving with fluids. Baseline cr is around 1.5. today 2.35, was 4.72 on arrival. - continue IVF  # Recent diarrhea Loose stool here, c diff and gi pathogen panel neg. Denies pain  # Rhinorrhea Per family. No cough or hypoxia. Respiratory panel neg, cxr nothing acute.  # Recent UTI EDP spoke with PCP who ways patient recently treated for uti and abx were transitioned to amoxicillin  based on sensitivities. Apparently received IM ceftriaxone  x1 and as of yesterday was on day 3 of amoxicillin  - f/u culture  # Advanced dementia # GAD [facility] participant with [facility]. No behavior disturbance - home buspar , lexapro , mirtazapine   # T2DM Diet controlled, euglycemic  # HTN Mild/mod elevations here. - home telmisartan on hold 2/2 aki   # Dysphagia Daughter says currently  on pureed diet - dysphagia 1 diet  # History CVA - cont home asa, statin  # Goals of care Daughter considering transition to hospice upon discharge, is a [facility] participant    DVT prophylaxis: heparin  Code Status: dnr/dni Family Communication: daughter updated telephonically 7/20  Level of care: Telemetry Medical Status is: Inpatient Remains inpatient appropriate because: severity of illness    Consultants:   none  Procedures: none  Antimicrobials:  none    Subjective: No complaints, denies pain  Objective: Vitals:   01/18/24 1628 01/19/24 0450 01/19/24 0716 01/19/24 0758  BP: (!) 145/95 (!) 169/72 (!) 181/67 (!) 159/76  Pulse: 72 63 79 63  Resp: 18 16 16    Temp: 98.1 F (36.7 C) 97.8 F (36.6 C) 98 F (36.7 C)   TempSrc:  Oral Oral   SpO2: 100% 100% 99%   Weight:      Height:        Intake/Output Summary (Last 24 hours) at 01/19/2024 1638 Last data filed at 01/19/2024 1300 Gross per 24 hour  Intake 1891.77 ml  Output --  Net 1891.77 ml   Filed Weights   01/16/24 1820  Weight: 65 kg    Examination:  General exam: Appears calm and comfortable. Chronically ill Respiratory system: Clear to auscultation save for bibasilar rales Cardiovascular system: S1 & S2 heard, RRR. Soft systolic murmur Gastrointestinal system: Abdomen is nondistended, soft and nontender. Central nervous system: Alert, not oriented Extremities: cool Skin: ulcer left heel Psychiatry: pleasantly demented    Data Reviewed: I have personally reviewed following labs and imaging studies  CBC: Recent Labs  Lab 01/16/24 1849 01/17/24 0339  WBC 7.8 7.2  NEUTROABS 6.5  --   HGB 11.6* 11.0*  HCT 36.7 34.9*  MCV 86.4 86.6  PLT 239 223   Basic Metabolic Panel: Recent Labs  Lab 01/16/24 1849 01/17/24 0339 01/18/24 0420 01/19/24 0310  NA 139 141 142 142  K 4.5 4.3 4.3 3.8  CL 111 115* 117* 113*  CO2 16* 19* 20* 21*  GLUCOSE 116* 79 73 93  BUN 80* 71* 59* 45*  CREATININE 4.72* 3.95* 2.98* 2.35*  CALCIUM  9.6 9.2 9.2 9.5   GFR: Estimated Creatinine Clearance: 16.8 mL/min (A) (by C-G formula based on SCr of 2.35 mg/dL (H)). Liver Function Tests: Recent Labs  Lab 01/16/24 1849  AST 19  ALT 17  ALKPHOS 166*  BILITOT 0.6  PROT 7.5  ALBUMIN  3.8   No results for input(s): LIPASE, AMYLASE in the last 168 hours. No results for input(s): AMMONIA in the last 168  hours. Coagulation Profile: No results for input(s): INR, PROTIME in the last 168 hours. Cardiac Enzymes: Recent Labs  Lab 01/16/24 1849  CKTOTAL 267*   BNP (last 3 results) No results for input(s): PROBNP in the last 8760 hours. HbA1C: No results for input(s): HGBA1C in the last 72 hours. CBG: No results for input(s): GLUCAP in the last 168 hours. Lipid Profile: No results for input(s): CHOL, HDL, LDLCALC, TRIG, CHOLHDL, LDLDIRECT in the last 72 hours. Thyroid Function Tests: No results for input(s): TSH, T4TOTAL, FREET4, T3FREE, THYROIDAB in the last 72 hours. Anemia Panel: No results for input(s): VITAMINB12, FOLATE, FERRITIN, TIBC, IRON, RETICCTPCT in the last 72 hours. Urine analysis:    Component Value Date/Time   COLORURINE YELLOW (A) 01/17/2024 1114   APPEARANCEUR HAZY (A) 01/17/2024 1114   LABSPEC 1.012 01/17/2024 1114   PHURINE 5.0 01/17/2024 1114   GLUCOSEU NEGATIVE 01/17/2024 1114   HGBUR SMALL (  A) 01/17/2024 1114   BILIRUBINUR NEGATIVE 01/17/2024 1114   KETONESUR NEGATIVE 01/17/2024 1114   PROTEINUR NEGATIVE 01/17/2024 1114   NITRITE NEGATIVE 01/17/2024 1114   LEUKOCYTESUR MODERATE (A) 01/17/2024 1114   Sepsis Labs: @LABRCNTIP (procalcitonin:4,lacticidven:4)  ) Recent Results (from the past 240 hours)  Culture, blood (Routine X 2) w Reflex to ID Panel     Status: None (Preliminary result)   Collection Time: 01/17/24 10:17 AM   Specimen: BLOOD  Result Value Ref Range Status   Specimen Description BLOOD BLOOD RIGHT HAND  Final   Special Requests   Final    BOTTLES DRAWN AEROBIC AND ANAEROBIC Blood Culture results may not be optimal due to an inadequate volume of blood received in culture bottles   Culture   Final    NO GROWTH 2 DAYS Performed at [facility], [address] [place], [place] [address]    Report Status PENDING  Incomplete  Culture, blood (Routine X 2) w Reflex to ID Panel     Status:  None (Preliminary result)   Collection Time: 01/17/24 10:17 AM   Specimen: BLOOD  Result Value Ref Range Status   Specimen Description BLOOD BLOOD LEFT FOREARM  Final   Special Requests   Final    BOTTLES DRAWN AEROBIC ONLY Blood Culture results may not be optimal due to an inadequate volume of blood received in culture bottles   Culture   Final    NO GROWTH 2 DAYS Performed at [facility], [address] [place], [place] [address]    Report Status PENDING  Incomplete  Urine Culture     Status: None   Collection Time: 01/17/24 11:14 AM   Specimen: Urine, Random  Result Value Ref Range Status   Specimen Description   Final    URINE, RANDOM Performed at [facility], [address] [place], [place] [address]    Special Requests   Final    NONE Reflexed from [id number] Performed at [facility], [address], [place], [place] [address]    Culture   Final    NO GROWTH Performed at [facility], [address] [place], [place] [address]    Report Status 01/19/2024 FINAL  Final  Respiratory (~20 pathogens) panel by PCR     Status: None   Collection Time: 01/17/24 11:15 AM   Specimen: Nasopharyngeal Swab; Respiratory  Result Value Ref Range Status   Adenovirus NOT DETECTED NOT DETECTED Final   Coronavirus 229E NOT DETECTED NOT DETECTED Final    Comment: (NOTE) The Coronavirus on the Respiratory Panel, DOES NOT test for the novel  Coronavirus (2019 nCoV)    Coronavirus HKU1 NOT DETECTED NOT DETECTED Final   Coronavirus NL63 NOT DETECTED NOT DETECTED Final   Coronavirus OC43 NOT DETECTED NOT DETECTED Final   Metapneumovirus NOT DETECTED NOT DETECTED Final   Rhinovirus / Enterovirus NOT DETECTED NOT DETECTED Final   Influenza A NOT DETECTED NOT DETECTED Final   Influenza B NOT DETECTED NOT DETECTED Final   Parainfluenza Virus 1 NOT DETECTED NOT DETECTED Final   Parainfluenza Virus 2 NOT DETECTED NOT DETECTED Final   Parainfluenza Virus 3 NOT  DETECTED NOT DETECTED Final   Parainfluenza Virus 4 NOT DETECTED NOT DETECTED Final   Respiratory Syncytial Virus NOT DETECTED NOT DETECTED Final   Bordetella pertussis NOT DETECTED NOT DETECTED Final   Bordetella Parapertussis NOT DETECTED NOT DETECTED Final   Chlamydophila pneumoniae NOT DETECTED NOT DETECTED Final   Mycoplasma pneumoniae  NOT DETECTED NOT DETECTED Final    Comment: Performed at [facility], [address], [place], [place] [address]  SARS Coronavirus 2 by RT PCR (hospital order, performed in [facility] hospital lab) *cepheid single result test* Anterior Nasal Swab     Status: None   Collection Time: 01/17/24 11:15 AM   Specimen: Anterior Nasal Swab  Result Value Ref Range Status   SARS Coronavirus 2 by RT PCR NEGATIVE NEGATIVE Final    Comment: (NOTE) SARS-CoV-2 target nucleic acids are NOT DETECTED.  The SARS-CoV-2 RNA is generally detectable in upper and lower respiratory specimens during the acute phase of infection. The lowest concentration of SARS-CoV-2 viral copies this assay can detect is 250 copies / mL. A negative result does not preclude SARS-CoV-2 infection and should not be used as the sole basis for treatment or other patient management decisions.  A negative result may occur with improper specimen collection / handling, submission of specimen other than nasopharyngeal swab, presence of viral mutation(s) within the areas targeted by this assay, and inadequate number of viral copies (<250 copies / mL). A negative result must be combined with clinical observations, patient history, and epidemiological information.  Fact Sheet for Patients:   RoadLapTop.co.za  Fact Sheet for Healthcare Providers: http://kim-miller.com/  This test is not yet approved or  cleared by the [place]  FDA and has been authorized for detection and/or diagnosis of SARS-CoV-2 by FDA under an Emergency Use Authorization  (EUA).  This EUA will remain in effect (meaning this test can be used) for the duration of the COVID-19 declaration under Section 564(b)(1) of the Act, 21 U.S.C. section 360bbb-3(b)(1), unless the authorization is terminated or revoked sooner.  Performed at [facility], [address] [place], [place] [address]   C Difficile Quick Screen w PCR reflex     Status: None   Collection Time: 01/17/24  3:39 PM   Specimen: STOOL  Result Value Ref Range Status   C Diff antigen NEGATIVE NEGATIVE Final   C Diff toxin NEGATIVE NEGATIVE Final   C Diff interpretation No C. difficile detected.  Final    Comment: Performed at [facility], [address] [place], [place] [address]  Gastrointestinal Panel by PCR , Stool     Status: None   Collection Time: 01/17/24  3:39 PM   Specimen: Stool  Result Value Ref Range Status   Campylobacter species NOT DETECTED NOT DETECTED Final   Plesimonas shigelloides NOT DETECTED NOT DETECTED Final   Salmonella species NOT DETECTED NOT DETECTED Final   Yersinia enterocolitica NOT DETECTED NOT DETECTED Final   Vibrio species NOT DETECTED NOT DETECTED Final   Vibrio cholerae NOT DETECTED NOT DETECTED Final   Enteroaggregative E coli (EAEC) NOT DETECTED NOT DETECTED Final   Enteropathogenic E coli (EPEC) NOT DETECTED NOT DETECTED Final   Enterotoxigenic E coli (ETEC) NOT DETECTED NOT DETECTED Final   Shiga like toxin producing E coli (STEC) NOT DETECTED NOT DETECTED Final   Shigella/Enteroinvasive E coli (EIEC) NOT DETECTED NOT DETECTED Final   Cryptosporidium NOT DETECTED NOT DETECTED Final   Cyclospora cayetanensis NOT DETECTED NOT DETECTED Final   Entamoeba histolytica NOT DETECTED NOT DETECTED Final   Giardia lamblia NOT DETECTED NOT DETECTED Final   Adenovirus F40/41 NOT DETECTED NOT DETECTED Final   Astrovirus NOT DETECTED NOT DETECTED Final   Norovirus GI/GII NOT DETECTED NOT DETECTED Final   Rotavirus A NOT DETECTED NOT DETECTED  Final   Sapovirus (I, II,  IV, and V) NOT DETECTED NOT DETECTED Final    Comment: Performed at [facility], [address] [place], [place] [address]         Radiology Studies: No results found.       Scheduled Meds:  aspirin  EC  81 mg Oral Daily   atorvastatin   40 mg Oral QHS   busPIRone   5 mg Oral BID   escitalopram   10 mg Oral Daily   heparin  injection (subcutaneous)  5,000 Units Subcutaneous Q8H   mirtazapine   7.5 mg Oral QHS   pantoprazole   40 mg Oral BID   prenatal multivitamin  1 tablet Oral Daily   tiZANidine   2 mg Oral QHS   Continuous Infusions:     LOS: 3 days     Devaughn KATHEE Ban, MD [facility]   If 7PM-7AM, please contact night-coverage [email or web address] Password [email or web address] 01/19/2024, 4:38 PM

## 2024-01-19 NOTE — Progress Notes (Signed)
 Patient discharged to home with [facility], called and updated daughter Abraham Chester. AVS printed and put in discharge packet and patient transported home via [facility].

## 2024-01-22 LAB — CULTURE, BLOOD (ROUTINE X 2)
Culture: NO GROWTH
Culture: NO GROWTH
